# Patient Record
Sex: Female | Born: 1960 | Race: White | Hispanic: No | Marital: Single | Smoking: Former smoker
Health system: Southern US, Community
[De-identification: ages and names within clinical notes are randomized; demographics above are authoritative.]

## PROBLEM LIST (undated history)

## (undated) DIAGNOSIS — K632 Fistula of intestine: Secondary | ICD-10-CM

## (undated) DIAGNOSIS — M1990 Unspecified osteoarthritis, unspecified site: Secondary | ICD-10-CM

## (undated) DIAGNOSIS — Z933 Colostomy status: Secondary | ICD-10-CM

## (undated) DIAGNOSIS — K5720 Diverticulitis of large intestine with perforation and abscess without bleeding: Secondary | ICD-10-CM

## (undated) DIAGNOSIS — R519 Headache, unspecified: Secondary | ICD-10-CM

## (undated) DIAGNOSIS — F319 Bipolar disorder, unspecified: Secondary | ICD-10-CM

## (undated) DIAGNOSIS — J342 Deviated nasal septum: Secondary | ICD-10-CM

## (undated) HISTORY — DX: Fistula of intestine: K63.2

## (undated) HISTORY — PX: ABDOMINAL HYSTERECTOMY: SHX81

## (undated) HISTORY — DX: Diverticulitis of large intestine with perforation and abscess without bleeding: K57.20

## (undated) HISTORY — PX: KNEE ARTHROSCOPY: SUR90

## (undated) HISTORY — PX: UPPER GI ENDOSCOPY: SHX6162

---

## 1998-06-10 ENCOUNTER — Ambulatory Visit (HOSPITAL_COMMUNITY): Admission: RE | Admit: 1998-06-10 | Discharge: 1998-06-10 | Payer: Self-pay

## 2000-08-15 ENCOUNTER — Inpatient Hospital Stay (HOSPITAL_COMMUNITY): Admission: EM | Admit: 2000-08-15 | Discharge: 2000-08-21 | Payer: Self-pay | Admitting: Psychiatry

## 2002-03-14 ENCOUNTER — Encounter: Payer: Self-pay | Admitting: Emergency Medicine

## 2002-03-14 ENCOUNTER — Emergency Department (HOSPITAL_COMMUNITY): Admission: EM | Admit: 2002-03-14 | Discharge: 2002-03-14 | Payer: Self-pay | Admitting: Emergency Medicine

## 2004-03-28 ENCOUNTER — Emergency Department (HOSPITAL_COMMUNITY): Admission: EM | Admit: 2004-03-28 | Discharge: 2004-03-28 | Payer: Self-pay | Admitting: Physician Assistant

## 2005-05-18 ENCOUNTER — Emergency Department (HOSPITAL_COMMUNITY): Admission: EM | Admit: 2005-05-18 | Discharge: 2005-05-18 | Payer: Self-pay | Admitting: Emergency Medicine

## 2010-02-27 ENCOUNTER — Emergency Department (HOSPITAL_COMMUNITY): Admission: EM | Admit: 2010-02-27 | Discharge: 2010-02-28 | Payer: Self-pay | Admitting: Emergency Medicine

## 2010-02-28 ENCOUNTER — Ambulatory Visit: Payer: Self-pay | Admitting: Psychiatry

## 2010-02-28 ENCOUNTER — Inpatient Hospital Stay (HOSPITAL_COMMUNITY): Admission: AD | Admit: 2010-02-28 | Discharge: 2010-03-04 | Payer: Self-pay | Admitting: Psychiatry

## 2010-11-21 LAB — RAPID URINE DRUG SCREEN, HOSP PERFORMED
Amphetamines: NOT DETECTED
Benzodiazepines: NOT DETECTED
Cocaine: NOT DETECTED
Tetrahydrocannabinol: POSITIVE — AB

## 2010-11-21 LAB — CBC
HCT: 39.5 % (ref 36.0–46.0)
MCV: 91.3 fL (ref 78.0–100.0)
Platelets: 253 10*3/uL (ref 150–400)
RBC: 4.33 MIL/uL (ref 3.87–5.11)
RDW: 13.9 % (ref 11.5–15.5)
WBC: 9.7 10*3/uL (ref 4.0–10.5)

## 2010-11-21 LAB — URINALYSIS, ROUTINE W REFLEX MICROSCOPIC
Nitrite: NEGATIVE
Protein, ur: NEGATIVE mg/dL
Specific Gravity, Urine: 1.025 (ref 1.005–1.030)
Urobilinogen, UA: 0.2 mg/dL (ref 0.0–1.0)

## 2010-11-21 LAB — BASIC METABOLIC PANEL
BUN: 19 mg/dL (ref 6–23)
CO2: 29 mEq/L (ref 19–32)
Calcium: 10 mg/dL (ref 8.4–10.5)
Chloride: 102 mEq/L (ref 96–112)
Creatinine, Ser: 0.81 mg/dL (ref 0.4–1.2)
GFR calc Af Amer: >60 mL/min (ref >60)
GFR calc non Af Amer: >60 mL/min (ref >60)
Glucose, Bld: 90 mg/dL (ref 70–99)
Potassium: 3.2 mEq/L — ABNORMAL LOW (ref 3.5–5.1)
Sodium: 139 mEq/L (ref 135–145)

## 2010-11-21 LAB — TSH: TSH: 0.734 u[IU]/mL (ref 0.350–4.500)

## 2010-11-21 LAB — DIFFERENTIAL
Basophils Absolute: 0 10*3/uL (ref 0.0–0.1)
Lymphocytes Relative: 26 % (ref 12–46)
Lymphs Abs: 2.6 10*3/uL (ref 0.7–4.0)
Monocytes Absolute: 0.6 10*3/uL (ref 0.1–1.0)
Neutro Abs: 6.4 10*3/uL (ref 1.7–7.7)

## 2010-11-21 LAB — ETHANOL: Alcohol, Ethyl (B): <5 mg/dL (ref 0–10)

## 2010-11-21 LAB — ACETAMINOPHEN LEVEL: Acetaminophen (Tylenol), Serum: <10 ug/mL — ABNORMAL LOW (ref 10–30)

## 2010-11-21 LAB — URINE CULTURE

## 2010-11-21 LAB — SALICYLATE LEVEL: Salicylate Lvl: <4 mg/dL (ref 2.8–20.0)
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
## 2017-05-11 ENCOUNTER — Other Ambulatory Visit (HOSPITAL_COMMUNITY): Payer: Self-pay | Admitting: Family

## 2017-05-11 ENCOUNTER — Ambulatory Visit (HOSPITAL_COMMUNITY)
Admission: RE | Admit: 2017-05-11 | Discharge: 2017-05-11 | Disposition: A | Discharge status: Home / Self Care | Payer: Self-pay | Source: Ambulatory Visit | Attending: Family | Admitting: Family

## 2017-05-11 DIAGNOSIS — L03119 Cellulitis of unspecified part of limb: Secondary | ICD-10-CM | POA: Insufficient documentation

## 2017-05-11 DIAGNOSIS — L0390 Cellulitis, unspecified: Secondary | ICD-10-CM

## 2017-07-13 ENCOUNTER — Other Ambulatory Visit (HOSPITAL_COMMUNITY): Payer: Self-pay | Admitting: *Deleted

## 2017-07-13 DIAGNOSIS — L97229 Non-pressure chronic ulcer of left calf with unspecified severity: Principal | ICD-10-CM

## 2017-07-13 DIAGNOSIS — I83022 Varicose veins of left lower extremity with ulcer of calf: Secondary | ICD-10-CM

## 2017-07-13 DIAGNOSIS — I878 Other specified disorders of veins: Secondary | ICD-10-CM

## 2017-07-18 ENCOUNTER — Encounter (HOSPITAL_COMMUNITY): Payer: Self-pay

## 2017-07-18 ENCOUNTER — Ambulatory Visit (HOSPITAL_COMMUNITY)
Admission: RE | Admit: 2017-07-18 | Discharge: 2017-07-18 | Disposition: A | Discharge status: Home / Self Care | Payer: Self-pay | Source: Ambulatory Visit | Attending: *Deleted | Admitting: *Deleted

## 2017-07-20 ENCOUNTER — Ambulatory Visit (HOSPITAL_COMMUNITY)
Admission: RE | Admit: 2017-07-20 | Discharge: 2017-07-20 | Disposition: A | Discharge status: Home / Self Care | Payer: Self-pay | Source: Ambulatory Visit | Attending: *Deleted | Admitting: *Deleted

## 2017-07-20 DIAGNOSIS — L97229 Non-pressure chronic ulcer of left calf with unspecified severity: Secondary | ICD-10-CM | POA: Insufficient documentation

## 2017-07-20 DIAGNOSIS — I878 Other specified disorders of veins: Secondary | ICD-10-CM | POA: Insufficient documentation

## 2017-07-20 DIAGNOSIS — I83022 Varicose veins of left lower extremity with ulcer of calf: Secondary | ICD-10-CM | POA: Insufficient documentation

## 2017-08-22 ENCOUNTER — Ambulatory Visit (HOSPITAL_COMMUNITY): Payer: Self-pay | Attending: *Deleted | Admitting: Physical Therapy

## 2017-08-22 ENCOUNTER — Encounter (HOSPITAL_COMMUNITY): Payer: Self-pay | Admitting: Physical Therapy

## 2017-08-22 DIAGNOSIS — L97923 Non-pressure chronic ulcer of unspecified part of left lower leg with necrosis of muscle: Secondary | ICD-10-CM | POA: Insufficient documentation

## 2017-08-22 NOTE — Therapy (Signed)
[facility] [address] [place], [place], [address] Phone: [phone]   Fax:  [phone]  Wound Care Evaluation  Patient Details  Name: Judith Miller MRN: [id number] Date of Birth: 11-Nov-1960 Referring Provider: Kizzie Furnish    Encounter Date: 08/22/2017  PT End of Session - 08/22/17 1544    Visit Number  1    Number of Visits  5    Date for PT Re-Evaluation  09/21/17    Authorization Type  no insurance    Authorization - Visit Number  1    Authorization - Number of Visits  5    PT Start Time  1500    PT Stop Time  1540    PT Time Calculation (min)  40 min    Activity Tolerance  Patient limited by pain       History reviewed. No pertinent past medical history.  History reviewed. No pertinent surgical history.  There were no vitals filed for this visit.    [facility] PT Assessment - 08/22/17 0001      Assessment   Medical Diagnosis  non-healing LT posterior calf wound    Referring Provider  Rochelle Muse     Onset Date/Surgical Date  02/17/17    Next MD Visit  unknown at this time     Prior Therapy  self care       Precautions   Precautions  Other (comment)    Precaution Comments  contact       Restrictions   Weight Bearing Restrictions  No      Balance Screen   Has the patient fallen in the past 6 months  No    Has the patient had a decrease in activity level because of a fear of falling?   No    Is the patient reluctant to leave their home because of a fear of falling?   No      Prior Function   Level of Independence  Independent    Vocation  Full time employment    Vocation Requirements  standing       Cognition   Overall Cognitive Status  Within Functional Limits for tasks assessed      Wound Therapy - 08/22/17 1452    Subjective  Ms. Reckart states that she has had a wound on the back of her left leg for about five  months.  She has been washing it but it will not heal.     Patient and Family  Stated Goals  wound to heal     Date of Onset  03/19/17    Prior Treatments  self care     Pain Assessment  0-10    Pain Score  8     Pain Type  Chronic pain    Pain Location  Calf    Pain Orientation  Left;Posterior    Pain Descriptors / Indicators  Aching;Throbbing    Pain Onset  With Activity    Patients Stated Pain Goal  0    Pain Intervention(s)  Emotional support Dressing     Evaluation and Treatment Procedures Explained to Patient/Family  Yes    Evaluation and Treatment Procedures  agreed to    Wound Properties Date First Assessed: 08/22/17 Time First Assessed: 1508 Wound Type: Other (Comment) Location: Leg Location Orientation: Left;Posterior;Lower Present on Admission: Yes   Dressing Type  Gauze (Comment)    Dressing Changed  Changed    Dressing Status  Old drainage  Dressing Change Frequency  PRN    Site / Wound Assessment  Brown;Dusky;Painful    % Wound base Red or Granulating  10%    % Wound base Other/Granulation Tissue (Comment)  100%    Peri-wound Assessment  Edema;Erythema (blanchable)    Wound Length (cm)  2 cm    Wound Width (cm)  2 cm    Wound Depth (cm)  -- unknown    Wound Surface Area (cm^2)  4 cm^2    Drainage Amount  Minimal    Drainage Description  Serosanguineous    Treatment  Cleansed;Other (Comment) compression dressing     Wound Therapy - Clinical Statement  Ms. Judith Miller is a 56 yo female who has a non-healing wound on the posterior aspect of her left leg that has been non-healing for the past 4-5 months.  Her pain is so high that she is unable to tolerate any debridement at this time.  She also has a wound on the anterior aspect of her left leg that is 1 cm diameter with a scab covering the wound.  At this time we do not have orders for this wound so we will keep an eye on it and if we feel that it needs treatment we will contacrt the MD.  Ms. Judith Miller currenty is being referred to skilled physical therapy to assist in the wound healing process.   Examination demonstrates increased edema, increased slough in the wound bed and increased pain affecting the pt abillty to walk.  Ms. Judith Miller will benefit from skilled physical therapy to create a healing environment for her wound.     Wound Therapy - Functional Problem List  difficulty walking     Factors Delaying/Impairing Wound Healing  Infection - systemic/local;Tobacco use;Vascular compromise    Hydrotherapy Plan  Debridement;Dressing change;Patient/family education    Wound Therapy - Frequency  Other (comment)    Wound Therapy - Current Recommendations  -- 2 x a week for the first week followed by once a week x 5     Wound Plan  cleanse, moisturize and debride     Dressing   medihoney colloid followed by profore compression dressing              Objective measurements completed on examination: See above findings.            PT Education - 08/22/17 1542    Education provided  Yes    Education Details  That smoking will significantly delay the healing of her wound, that pt needs to wear compression stockings everyday to prevent future wounds; to keep dressing clean and dry.     Person(s) Educated  Patient    Methods  Explanation    Comprehension  Verbalized understanding       PT Short Term Goals - 08/22/17 1547      PT SHORT TERM GOAL #1   Title  wound to be 80% granulated to reduce risk of infection     Time  3    Period  Weeks    Status  New    Target Date  09/12/17      PT SHORT TERM GOAL #2   Title  Pt pain to be decreased to no more than a 3/10 to allow pt to be up for 8 hours without significant pain to be able to work.    Time  2    Period  Weeks    Status  New    Target Date  09/05/17  PT SHORT TERM GOAL #3   Title  PT to verbalize the understanding of smoking cessation and use of compression garments in order assist in wound healing and prevent future wounds.    Time  2    Period  Weeks    Status  New        PT Long Term Goals -  08/22/17 1549      PT LONG TERM GOAL #1   Title  Wound to be 100% granulated with no drainage to decrease risk of infection     Time  4    Period  Weeks    Status  New    Target Date  09/19/17      PT LONG TERM GOAL #2   Title  Pt wound to be 1x1 cm in order for pt to feel confident in taking care of her wound at home.     Time  4    Period  Weeks    Status  New           Plan - 08/22/17 1545    Clinical Impression Statement  see above     Clinical Presentation  Stable    Clinical Decision Making  Low    Rehab Potential  Good    PT Frequency  -- 2x a week for the first week then once a week x 4 weeks     PT Treatment/Interventions  Other (comment) debridement and multilayer compression dressing     PT Next Visit Plan  see above.     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 Virgina OrganCynthia Russell, PT CLT [phone] 08/22/2017, 3:58 PM  [facility] [address] , [place], [address] Phone: [phone]   Fax:  [phone]  Name: Duncan DullKaren F Mclinden MRN: [id number] Date of Birth: December 01, 1960

## 2017-08-23 ENCOUNTER — Telehealth (HOSPITAL_COMMUNITY): Payer: Self-pay

## 2017-08-24 ENCOUNTER — Ambulatory Visit (HOSPITAL_COMMUNITY): Payer: Self-pay

## 2017-08-24 ENCOUNTER — Encounter (HOSPITAL_COMMUNITY): Payer: Self-pay

## 2017-08-24 DIAGNOSIS — L97923 Non-pressure chronic ulcer of unspecified part of left lower leg with necrosis of muscle: Secondary | ICD-10-CM

## 2017-08-24 NOTE — Therapy (Signed)
[facility] [address] [place], [place], [address] Phone: [phone]   Fax:  [phone]  Wound Care Therapy  Patient Details  Name: Judith Miller MRN: [id number] Date of Birth: [birth year] Referring Provider: Kizzie Furnishochelle Muse    Encounter Date: 08/24/2017  PT End of Session - 08/24/17 1826    Visit Number  2    Number of Visits  5    Date for PT Re-Evaluation  09/21/17    Authorization Type  no insurance    Authorization - Visit Number  2    Authorization - Number of Visits  5    PT Start Time  1725    PT Stop Time  1805    PT Time Calculation (min)  40 min    Activity Tolerance  Patient tolerated treatment well       History reviewed. No pertinent past medical history.  History reviewed. No pertinent surgical history.  There were no vitals filed for this visit.   Subjective Assessment - 08/24/17 1818    Subjective  Pt stated her leg is feeling a lot better, no reports of pain at entrance.  Dressing intact    Currently in Pain?  No/denies                Wound Therapy - 08/24/17 1819    Subjective  Pt stated her leg is feeling a lot better, no reports of pain at entrance.  Dressing intact    Patient and Family Stated Goals  wound to heal     Date of Onset  03/19/17    Prior Treatments  self care     Pain Assessment  No/denies pain    Evaluation and Treatment Procedures Explained to Patient/Family  Yes    Evaluation and Treatment Procedures  agreed to    Wound Properties Date First Assessed: 08/22/17 Time First Assessed: 1508 Wound Type: Other (Comment) Location: Leg Location Orientation: Left;Posterior;Lower Present on Admission: Yes   Dressing Type  Compression wrap;Gauze (Comment) honey colloid, profore, netting    Dressing Changed  Changed    Dressing Status  Old drainage    Dressing Change Frequency  PRN    Site / Wound Assessment  Brown;Dusky;Painful;Pink    % Wound base Red or Granulating  15%    %  Wound base Yellow/Fibrinous Exudate  85%    Peri-wound Assessment  Edema;Erythema (blanchable)    Wound Length (cm)  2.2 cm    Wound Width (cm)  2.4 cm    Wound Depth (cm)  -- unknown    Wound Surface Area (cm^2)  5.28 cm^2    Drainage Amount  Minimal    Drainage Description  Serosanguineous    Treatment  Cleansed;Debridement (Selective)    Selective Debridement - Location  wound bed     Selective Debridement - Tools Used  Forceps;Scalpel    Selective Debridement - Tissue Removed  slough and dry skin perimeter    Wound Therapy - Clinical Statement  Pt arrived with dressings intact, reoprts she took a shower with trash bag on dressings but got foot a little wet.  Honey seems to have assisted with debridement this session and pt with improved tolerance with debridement.  Continued with honeycolloid and profore for edema control.  No reports of increased pain, did c/o some tingling.  Discussed smoking impaires healing, verbal understood.    Wound Therapy - Functional Problem List  difficulty walking     Factors Delaying/Impairing Wound Healing  Infection - systemic/local;Tobacco use;Vascular compromise    Hydrotherapy Plan  Debridement;Dressing change;Patient/family education    Wound Therapy - Frequency  -- 2x/week first week then reduce to 1x/week    Wound Plan  cleanse, moisturize and debride     Dressing   medihoney colloid followed by profore compression dressing                 PT Short Term Goals - 08/22/17 1547      PT SHORT TERM GOAL #1   Title  wound to be 80% granulated to reduce risk of infection     Time  3    Period  Weeks    Status  New    Target Date  09/12/17      PT SHORT TERM GOAL #2   Title  Pt pain to be decreased to no more than a 3/10 to allow pt to be up for 8 hours without significant pain to be able to work.    Time  2    Period  Weeks    Status  New    Target Date  09/05/17      PT SHORT TERM GOAL #3   Title  PT to verbalize the understanding of  smoking cessation and use of compression garments in order assist in wound healing and prevent future wounds.    Time  2    Period  Weeks    Status  New        PT Long Term Goals - 08/22/17 1549      PT LONG TERM GOAL #1   Title  Wound to be 100% granulated with no drainage to decrease risk of infection     Time  4    Period  Weeks    Status  New    Target Date  09/19/17      PT LONG TERM GOAL #2   Title  Pt wound to be 1x1 cm in order for pt to feel confident in taking care of her wound at home.     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  [address]Casey Cockerham, LPTA; CBIS [phone]  [name], [name] 08/24/2017, 6:28 PM  [facility] [address] [place], [place], [address] Phone: [phone]   Fax:  [phone]  Name: Judith Miller MRN: [id number] Date of Birth: 04/09/1961

## 2017-08-30 ENCOUNTER — Telehealth (HOSPITAL_COMMUNITY): Payer: Self-pay | Admitting: Family

## 2017-08-30 NOTE — Telephone Encounter (Signed)
08/30/17  Pt called to say that it may be around 5:00 when she gets here for her 4:45 appt on 12/27.  She gets off work at 4 and will be coming from [place].

## 2017-08-31 ENCOUNTER — Ambulatory Visit (HOSPITAL_COMMUNITY): Payer: Self-pay | Admitting: Physical Therapy

## 2017-08-31 DIAGNOSIS — L97923 Non-pressure chronic ulcer of unspecified part of left lower leg with necrosis of muscle: Secondary | ICD-10-CM

## 2017-08-31 NOTE — Therapy (Signed)
[facility] [address] [place], [place], [address] Phone: [phone]   Fax:  [phone]  Wound Care Therapy  Patient Details  Name: Judith Miller MRN: [id number] Date of Birth: February 21, 1961 Referring Provider: Kizzie Furnishochelle Muse    Encounter Date: 08/31/2017  PT End of Session - 08/31/17 1811    Visit Number  3    Number of Visits  5    Date for PT Re-Evaluation  09/21/17    Authorization Type  no insurance    Authorization - Visit Number  3    Authorization - Number of Visits  5    PT Start Time  1507    PT Stop Time  1535    PT Time Calculation (min)  28 min    Activity Tolerance  Patient tolerated treatment well       No past medical history on file.  No past surgical history on file.  There were no vitals filed for this visit.              Wound Therapy - 08/31/17 1809    Subjective  Pt reports she is doing well.  States the dressing remained in place    Patient and Family Stated Goals  wound to heal     Date of Onset  03/19/17    Prior Treatments  self care     Pain Assessment  No/denies pain    Evaluation and Treatment Procedures Explained to Patient/Family  Yes    Evaluation and Treatment Procedures  agreed to    Wound Properties Date First Assessed: 08/22/17 Time First Assessed: 1508 Wound Type: Other (Comment) Location: Leg Location Orientation: Left;Posterior;Lower Present on Admission: Yes   Dressing Type  Compression wrap;Gauze (Comment) honey colloid, profore, netting    Dressing Changed  Changed    Dressing Status  Old drainage    Dressing Change Frequency  PRN    Site / Wound Assessment  Brown;Dusky;Painful;Pink    % Wound base Red or Granulating  40%    % Wound base Yellow/Fibrinous Exudate  60%    Peri-wound Assessment  Edema;Erythema (blanchable)    Drainage Amount  Minimal    Drainage Description  Serosanguineous    Treatment  Cleansed;Debridement (Selective)    Selective Debridement -  Location  wound bed     Selective Debridement - Tools Used  Forceps;Scalpel    Selective Debridement - Tissue Removed  slough and dry skin perimeter    Wound Therapy - Clinical Statement  dressing intact.  Much improved granualtion following debridment.  Pt tolerated well.  Continued with medihoney colloid. Cleased and moisturized LE well priro to redressing with profore.      Wound Therapy - Functional Problem List  difficulty walking     Factors Delaying/Impairing Wound Healing  Infection - systemic/local;Tobacco use;Vascular compromise    Hydrotherapy Plan  Debridement;Dressing change;Patient/family education    Wound Therapy - Frequency  -- 2x/week first week then reduce to 1x/week    Wound Plan  cleanse, moisturize and debride     Dressing   medihoney colloid followed by profore compression dressing                 PT Short Term Goals - 08/22/17 1547      PT SHORT TERM GOAL #1   Title  wound to be 80% granulated to reduce risk of infection     Time  3    Period  Weeks  Status  New    Target Date  09/12/17      PT SHORT TERM GOAL #2   Title  Pt pain to be decreased to no more than a 3/10 to allow pt to be up for 8 hours without significant pain to be able to work.    Time  2    Period  Weeks    Status  New    Target Date  09/05/17      PT SHORT TERM GOAL #3   Title  PT to verbalize the understanding of smoking cessation and use of compression garments in order assist in wound healing and prevent future wounds.    Time  2    Period  Weeks    Status  New        PT Long Term Goals - 08/22/17 1549      PT LONG TERM GOAL #1   Title  Wound to be 100% granulated with no drainage to decrease risk of infection     Time  4    Period  Weeks    Status  New    Target Date  09/19/17      PT LONG TERM GOAL #2   Title  Pt wound to be 1x1 cm in order for pt to feel confident in taking care of her wound at home.     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  Lurena NidaAmy B Welby Montminy, PTA/CLT [phone]  Lurena NidaFrazier, Crecencio Kwiatek B 08/31/2017, 6:12 PM  [facility] [address] [place], [place], [address] Phone: [phone]   Fax:  [phone]  Name: Judith Miller MRN: [id number] Date of Birth: May 14, 1961

## 2017-09-07 ENCOUNTER — Encounter (HOSPITAL_COMMUNITY): Payer: Self-pay

## 2017-09-07 ENCOUNTER — Ambulatory Visit (HOSPITAL_COMMUNITY): Payer: Self-pay | Attending: *Deleted

## 2017-09-07 DIAGNOSIS — L97923 Non-pressure chronic ulcer of unspecified part of left lower leg with necrosis of muscle: Secondary | ICD-10-CM | POA: Insufficient documentation

## 2017-09-07 NOTE — Therapy (Signed)
[facility] [address] [place], [place], [address] Phone: [phone]   Fax:  [phone]  Wound Care Therapy  Patient Details  Name: Judith Miller MRN: [id number] Date of Birth: [birth year] Referring Provider: Kizzie Furnishochelle Muse    Encounter Date: 09/07/2017  PT End of Session - 09/07/17 1749    Visit Number  4    Number of Visits  5    Date for PT Re-Evaluation  09/21/17    Authorization Type  no insurance    Authorization - Visit Number  4    Authorization - Number of Visits  5    PT Start Time  1615    PT Stop Time  1645    PT Time Calculation (min)  30 min    Activity Tolerance  Patient tolerated treatment well    Behavior During Therapy  [facility] for tasks assessed/performed       History reviewed. No pertinent past medical history.  History reviewed. No pertinent surgical history.  There were no vitals filed for this visit.   Subjective Assessment - 09/07/17 1743    Subjective  Pt arrived with dressings intact, reoprts she had to remove an outer layer for comfort as well as having to replace due to water on dressings.    Currently in Pain?  No/denies                Wound Therapy - 09/07/17 1744    Subjective  Pt arrived with dressings intact, reoprts she had to remove an outer layer for comfort as well as having to replace due to water on dressings.    Patient and Family Stated Goals  wound to heal     Date of Onset  03/19/17    Prior Treatments  self care     Pain Assessment  No/denies pain    Evaluation and Treatment Procedures Explained to Patient/Family  Yes    Evaluation and Treatment Procedures  agreed to    Wound Properties Date First Assessed: 08/22/17 Time First Assessed: 1508 Wound Type: Other (Comment) Location: Leg Location Orientation: Left;Posterior;Lower Present on Admission: Yes   Dressing Type  -- medihoneycolloid, alginate, gauze and profore     Dressing Changed  Changed    Dressing Status  Old  drainage    Dressing Change Frequency  PRN    Site / Wound Assessment  Brown;Dusky;Painful;Pink    % Wound base Red or Granulating  75%    % Wound base Yellow/Fibrinous Exudate  25%    Wound Length (cm)  2.2 cm was 2 on 12/18    Wound Width (cm)  2.4 cm was 2cm on 12/18    Wound Depth (cm)  -- unknown    Wound Surface Area (cm^2)  5.28 cm^2    Drainage Amount  Minimal    Drainage Description  Serosanguineous    Treatment  Cleansed;Debridement (Selective)    Selective Debridement - Location  wound bed and perimeter    Selective Debridement - Tools Used  Forceps;Scalpel    Selective Debridement - Tissue Removed  slough and dry skin perimeter    Wound Therapy - Clinical Statement  Noted maceration perimeter of wound upon removal, selective debridement for removal of slough on wound bed and dead tissue to promote healing.  Continued with medihoney colloid, with additional alginate to address drainage and profore wrap for edema control.      Wound Therapy - Functional Problem List  difficulty walking  Factors Delaying/Impairing Wound Healing  Infection - systemic/local;Tobacco use;Vascular compromise    Hydrotherapy Plan  Debridement;Dressing change;Patient/family education    Wound Therapy - Frequency  -- 1x/ week    Wound Plan  cleanse, moisturize and debride     Dressing   medihoney colloid, alginate followed by profore compression dressing                 PT Short Term Goals - 08/22/17 1547      PT SHORT TERM GOAL #1   Title  wound to be 80% granulated to reduce risk of infection     Time  3    Period  Weeks    Status  New    Target Date  09/12/17      PT SHORT TERM GOAL #2   Title  Pt pain to be decreased to no more than a 3/10 to allow pt to be up for 8 hours without significant pain to be able to work.    Time  2    Period  Weeks    Status  New    Target Date  09/05/17      PT SHORT TERM GOAL #3   Title  PT to verbalize the understanding of smoking cessation  and use of compression garments in order assist in wound healing and prevent future wounds.    Time  2    Period  Weeks    Status  New        PT Long Term Goals - 08/22/17 1549      PT LONG TERM GOAL #1   Title  Wound to be 100% granulated with no drainage to decrease risk of infection     Time  4    Period  Weeks    Status  New    Target Date  09/19/17      PT LONG TERM GOAL #2   Title  Pt wound to be 1x1 cm in order for pt to feel confident in taking care of her wound at home.     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  [address] LPTA; CBIS [phone]  Juel Burrow 09/07/2017, 5:49 PM  [facility] [address] [place], [place], [address] Phone: [phone]   Fax:  [phone]  Name: Judith Miller MRN: [id number] Date of Birth: 10/30/1960

## 2017-09-14 ENCOUNTER — Ambulatory Visit (HOSPITAL_COMMUNITY): Payer: Self-pay | Admitting: Physical Therapy

## 2017-09-15 ENCOUNTER — Telehealth (HOSPITAL_COMMUNITY): Payer: Self-pay | Admitting: Physical Therapy

## 2017-09-15 ENCOUNTER — Ambulatory Visit (HOSPITAL_COMMUNITY): Payer: Self-pay | Admitting: Physical Therapy

## 2017-09-15 ENCOUNTER — Telehealth (HOSPITAL_COMMUNITY): Payer: Self-pay | Admitting: Family

## 2017-09-15 DIAGNOSIS — L97923 Non-pressure chronic ulcer of unspecified part of left lower leg with necrosis of muscle: Secondary | ICD-10-CM

## 2017-09-15 NOTE — Therapy (Signed)
[facility] [address] [place], [place], [address] Phone: [phone]   Fax:  [phone]  Physical Therapy Treatment  Patient Details  Name: Judith Miller MRN: [id number] Date of Birth: 1960-11-11 Referring Provider: Kizzie Furnishochelle Muse    Encounter Date: 09/15/2017  PT End of Session - 09/15/17 1734    Visit Number  5    Number of Visits  6    Date for PT Re-Evaluation  09/21/17    Authorization Type  no insurance    Authorization - Visit Number  5    Authorization - Number of Visits  6    Activity Tolerance  Patient tolerated treatment well    Behavior During Therapy  [facility] for tasks assessed/performed       No past medical history on file.  No past surgical history on file.  There were no vitals filed for this visit.                 Wound Therapy - 09/15/17 1723    Subjective  Pt reports her leg is feeling better overall    Patient and Family Stated Goals  wound to heal     Date of Onset  03/19/17    Prior Treatments  self care     Pain Assessment  No/denies pain    Evaluation and Treatment Procedures Explained to Patient/Family  Yes    Evaluation and Treatment Procedures  agreed to    Wound Properties Date First Assessed: 08/22/17 Time First Assessed: 1508 Wound Type: Other (Comment) Location: Leg Location Orientation: Left;Posterior;Lower Present on Admission: Yes   Dressing Type  Compression wrap;Gauze (Comment)    Dressing Changed  Changed    Dressing Status  Old drainage    Dressing Change Frequency  PRN    Site / Wound Assessment  Red;Pink;Dry    % Wound base Red or Granulating  75%    % Wound base Yellow/Fibrinous Exudate  25%    Wound Length (cm)  2 cm was 2.2    Wound Width (cm)  2.2 cm was 2.1    Wound Surface Area (cm^2)  4.4 cm^2    Drainage Amount  Scant    Drainage Description  Serosanguineous    Treatment  Cleansed;Debridement (Selective)    Selective Debridement - Location  wound bed and  perimeter    Selective Debridement - Tools Used  Forceps;Scalpel    Selective Debridement - Tissue Removed  slough and dry skin perimeter    Wound Therapy - Clinical Statement  Wound remeasured with overall reduction in size compared to last session.  Hypergranlualtion noted and was able to blade most of his as well as hard edges preventing approximation.  Pt voices she is unable to take care of wound on her own at this point. Changed dressing to xeroform and applied medipore at a stretch to add additional compression.  Moisturized LE well priro to bandaging with profore.    Wound Therapy - Functional Problem List  difficulty walking     Factors Delaying/Impairing Wound Healing  Infection - systemic/local;Tobacco use;Vascular compromise    Hydrotherapy Plan  Debridement;Dressing change;Patient/family education    Wound Plan  continue 1X week until patient can manage independently.  Give patient information on elastic therapy outlet in [place].    Dressing   xeroform, profore               PT Education - 09/15/17 1733    Education provided  Yes  Education Details  importance of smoking cessation and need to continue wearing compression stockings.    Person(s) Educated  Patient    Methods  Explanation    Comprehension  Verbalized understanding;Need further instruction       PT Short Term Goals - 08/22/17 1547      PT SHORT TERM GOAL #1   Title  wound to be 80% granulated to reduce risk of infection     Time  3    Period  Weeks    Status  New    Target Date  09/12/17      PT SHORT TERM GOAL #2   Title  Pt pain to be decreased to no more than a 3/10 to allow pt to be up for 8 hours without significant pain to be able to work.    Time  2    Period  Weeks    Status  New    Target Date  09/05/17      PT SHORT TERM GOAL #3   Title  PT to verbalize the understanding of smoking cessation and use of compression garments in order assist in wound healing and prevent future wounds.     Time  2    Period  Weeks    Status  New        PT Long Term Goals - 08/22/17 1549      PT LONG TERM GOAL #1   Title  Wound to be 100% granulated with no drainage to decrease risk of infection     Time  4    Period  Weeks    Status  New    Target Date  09/19/17      PT LONG TERM GOAL #2   Title  Pt wound to be 1x1 cm in order for pt to feel confident in taking care of her wound at home.     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  Lurena Nida, PTA/CLT [phone]  Lurena Nida 09/15/2017, 5:35 PM  [facility] [address] [place], [place], [address] Phone: [phone]   Fax:  [phone]  Name: Judith Miller MRN: [id number] Date of Birth: 1960/11/19

## 2017-09-15 NOTE — Telephone Encounter (Signed)
09/15/17  I tried calling both numbers listed to see if patient could come in earlier but neither are working

## 2017-09-15 NOTE — Telephone Encounter (Signed)
Updated phone numbers for pt. NF 09/15/17

## 2017-09-21 ENCOUNTER — Ambulatory Visit (HOSPITAL_COMMUNITY): Payer: Self-pay | Admitting: Physical Therapy

## 2017-09-21 DIAGNOSIS — L97923 Non-pressure chronic ulcer of unspecified part of left lower leg with necrosis of muscle: Secondary | ICD-10-CM

## 2017-09-21 NOTE — Therapy (Signed)
[facility] [address] [place], [place], [address] Phone: [phone]   Fax:  [phone]  Wound Care Therapy  Patient Details  Name: Judith Miller MRN: [id number] Date of Birth: [birth year] Referring Provider: Kizzie Furnishochelle Muse    Encounter Date: 09/21/2017    No past medical history on file.  No past surgical history on file.  There were no vitals filed for this visit.              Wound Therapy - 09/21/17 1739    Subjective  no pain or issues to report    Patient and Family Stated Goals  wound to heal     Date of Onset  03/19/17    Prior Treatments  self care     Pain Assessment  No/denies pain    Evaluation and Treatment Procedures Explained to Patient/Family  Yes    Evaluation and Treatment Procedures  agreed to    Wound Properties Date First Assessed: 08/22/17 Time First Assessed: 1508 Wound Type: Other (Comment) Location: Leg Location Orientation: Left;Posterior;Lower Present on Admission: Yes   Dressing Type  Compression wrap;Gauze (Comment)    Dressing Changed  Changed    Dressing Status  Old drainage    Dressing Change Frequency  PRN    Site / Wound Assessment  Red;Pink;Dry    % Wound base Red or Granulating  85%    % Wound base Yellow/Fibrinous Exudate  15%    Wound Length (cm)  1.8 cm    Wound Width (cm)  1.8 cm    Wound Depth (cm)  0 cm    Wound Volume (cm^3)  0 cm^3    Wound Surface Area (cm^2)  3.24 cm^2    Drainage Amount  Scant    Drainage Description  Serosanguineous    Treatment  Cleansed;Debridement (Selective)    Selective Debridement - Location  wound bed and perimeter    Selective Debridement - Tools Used  Forceps;Scalpel    Selective Debridement - Tissue Removed  slough and dry skin perimeter    Wound Therapy - Clinical Statement  Vast improvment noted this session since changing dressing to xeroform and applying compression with medipore tape. Cleansed area well and debrided edges using  scapel and forceps.  Edges are now approximating well.  continued use of xeroform and profore.  Weekly visits remain appropriate.      Wound Therapy - Functional Problem List  difficulty walking     Factors Delaying/Impairing Wound Healing  Infection - systemic/local;Tobacco use;Vascular compromise    Hydrotherapy Plan  Debridement;Dressing change;Patient/family education    Wound Plan  continue 1X week until patient can manage independently.  Give patient information on elastic therapy outlet in [place].    Dressing   xeroform, profore                PT Short Term Goals - 08/22/17 1547      PT SHORT TERM GOAL #1   Title  wound to be 80% granulated to reduce risk of infection     Time  3    Period  Weeks    Status  New    Target Date  09/12/17      PT SHORT TERM GOAL #2   Title  Pt pain to be decreased to no more than a 3/10 to allow pt to be up for 8 hours without significant pain to be able to work.    Time  2    Period  Weeks    Status  New    Target Date  09/05/17      PT SHORT TERM GOAL #3   Title  PT to verbalize the understanding of smoking cessation and use of compression garments in order assist in wound healing and prevent future wounds.    Time  2    Period  Weeks    Status  New        PT Long Term Goals - 08/22/17 1549      PT LONG TERM GOAL #1   Title  Wound to be 100% granulated with no drainage to decrease risk of infection     Time  4    Period  Weeks    Status  New    Target Date  09/19/17      PT LONG TERM GOAL #2   Title  Pt wound to be 1x1 cm in order for pt to feel confident in taking care of her wound at home.     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  Lurena Nida, PTA/CLT [phone]  Lurena Nida 09/21/2017, 5:44 PM  [facility] [address] [place], [place], [address] Phone: [phone]   Fax:  [phone]  Name: Judith Miller MRN: [id number] Date of Birth: May 01, 1961

## 2017-09-28 ENCOUNTER — Other Ambulatory Visit: Payer: Self-pay

## 2017-09-28 ENCOUNTER — Encounter (HOSPITAL_COMMUNITY): Payer: Self-pay | Admitting: Physical Therapy

## 2017-09-28 ENCOUNTER — Ambulatory Visit (HOSPITAL_COMMUNITY): Payer: Self-pay | Admitting: Physical Therapy

## 2017-09-28 DIAGNOSIS — L97923 Non-pressure chronic ulcer of unspecified part of left lower leg with necrosis of muscle: Secondary | ICD-10-CM

## 2017-09-28 NOTE — Therapy (Signed)
Judith Miller, [place], [address] Phone: [phone]   Fax:  [phone]  Wound Care Therapy  Patient Details  Name: Judith Miller MRN: [id number] Date of Birth: February 09, 1961 Referring Provider: Royce Macadamia    Encounter Date: 09/28/2017  PT End of Session - 09/28/17 1600    Visit Number  6    Number of Visits  6    Date for PT Re-Evaluation  09/21/17    Authorization Type  no insurance    Authorization - Visit Number  6    Authorization - Number of Visits  6    PT Start Time  6811    PT Stop Time  1550    PT Time Calculation (min)  35 min    Activity Tolerance  Patient tolerated treatment well    Behavior During Therapy  [facility] for tasks assessed/performed       History reviewed. No pertinent past medical history.  History reviewed. No pertinent surgical history.  There were no vitals filed for this visit.              Wound Therapy - 09/28/17 1552    Subjective  no pain or issues to report    Patient and Family Stated Goals  wound to heal     Date of Onset  03/19/17    Prior Treatments  self care     Pain Assessment  No/denies pain    Evaluation and Treatment Procedures Explained to Patient/Family  Yes    Evaluation and Treatment Procedures  agreed to    Wound Properties Date First Assessed: 08/22/17 Time First Assessed: 1508 Wound Type: Other (Comment) Location: Leg Location Orientation: Left;Posterior;Lower Present on Admission: Yes   Dressing Type  Compression wrap;Gauze (Comment)    Dressing Changed  Changed    Dressing Status  Old drainage    Dressing Change Frequency  PRN    Site / Wound Assessment  Red;Pink;Dry    % Wound base Red or Granulating  90% was 60%    % Wound base Yellow/Fibrinous Exudate  10% was 40%    Wound Length (cm)  1.2 cm was 2.2    Wound Width (cm)  1.3 cm was 2.4     Wound Surface Area (cm^2)  1.56 cm^2    Drainage Amount  Scant    Drainage Description   Serosanguineous    Treatment  Cleansed;Debridement (Selective)    Selective Debridement - Location  wound bed and perimeter    Selective Debridement - Tools Used  Forceps    Selective Debridement - Tissue Removed  slough and dry skin perimeter    Wound Therapy - Clinical Statement  Pt wound continues to decrease in size and increase in granulation.  Pt wound size no longer warrents skilled care.  Pt was given information on where to obtain a compression stocking and measured for correct fit.  Therapist explained to call down to the outlet prior to going down to ensure that they had her size available.  Pt states that she will need to get her son to drive her to [facility] as her car is acting up; therefore therapist applied profore to LE and instructed pt that when her son can take her take the profore off prior to leaving  and that she must get her compression stockings within the week.  Pt voiced understanding.     Wound Therapy - Functional Problem List  difficulty walking  Factors Delaying/Impairing Wound Healing  Infection - systemic/local;Tobacco use;Vascular compromise    Hydrotherapy Plan  Debridement;Dressing change;Patient/family education    Wound Plan  continue 1X week until patient can manage independently.  Give patient information on elastic therapy outlet in [place].    Dressing   xeroform, profore              PT Education - 09/28/17 1600    Education provided  Yes    Education Details  see above    Person(s) Educated  Patient    Methods  Explanation    Comprehension  Verbalized understanding       PT Short Term Goals - 09/28/17 1601      PT SHORT TERM GOAL #1   Title  wound to be 80% granulated to reduce risk of infection     Time  3    Period  Weeks    Status  Achieved      PT SHORT TERM GOAL #2   Title  Pt pain to be decreased to no more than a 3/10 to allow pt to be up for 8 hours without significant pain to be able to work.    Time  2    Period  Weeks     Status  Achieved      PT SHORT TERM GOAL #3   Title  PT to verbalize the understanding of smoking cessation and use of compression garments in order assist in wound healing and prevent future wounds.    Time  2    Period  Weeks    Status  Achieved        PT Long Term Goals - 09/28/17 1601      PT LONG TERM GOAL #1   Title  Wound to be 100% granulated with no drainage to decrease risk of infection     Baseline  was 60% granulated now 40%     Time  4    Period  Weeks    Status  Partially Met      PT LONG TERM GOAL #2   Title  Pt wound to be 1x1 cm in order for pt to feel confident in taking care of her wound at home.     Baseline  -- currently 1.2 x 1.3     Time  4    Period  Weeks    Status  Partially Met            Plan - 09/28/17 1601    Rehab Potential  Good    PT Frequency  -- 2x a week for the first week then once a week x 4 weeks     PT Treatment/Interventions  Other (comment) debridement and multilayer compression dressing     PT Next Visit Plan  Discharge     Consulted and Agree with Plan of Care  Patient       Patient will benefit from skilled therapeutic intervention in order to improve the following deficits and impairments:  Pain, Difficulty walking, Other (comment)(debridement and compression dressing change)  Visit Diagnosis: 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   Rayetta Humphrey, PT CLT [phone] 09/28/2017, 4:03 PM  [facility] [address] [place], [place], [address] Phone: [phone]   Fax:  [phone]  Name: Judith Miller MRN: [id number] Date of Birth: [birth year]

## 2017-10-02 ENCOUNTER — Ambulatory Visit (HOSPITAL_COMMUNITY): Payer: Self-pay | Admitting: Physical Therapy

## 2017-10-05 ENCOUNTER — Ambulatory Visit (HOSPITAL_COMMUNITY): Payer: Self-pay

## 2017-10-09 ENCOUNTER — Ambulatory Visit (HOSPITAL_COMMUNITY): Payer: Self-pay | Admitting: Physical Therapy

## 2017-10-16 ENCOUNTER — Ambulatory Visit (HOSPITAL_COMMUNITY): Payer: Self-pay | Admitting: Physical Therapy

## 2017-10-23 ENCOUNTER — Ambulatory Visit (HOSPITAL_COMMUNITY): Payer: Self-pay | Admitting: Physical Therapy

## 2017-10-30 ENCOUNTER — Ambulatory Visit (HOSPITAL_COMMUNITY): Payer: Self-pay | Admitting: Physical Therapy

## 2019-10-02 ENCOUNTER — Emergency Department (HOSPITAL_COMMUNITY)
Admission: EM | Admit: 2019-10-02 | Discharge: 2019-10-02 | Disposition: A | Discharge status: Home / Self Care | Payer: Self-pay | Attending: Emergency Medicine | Admitting: Emergency Medicine

## 2019-10-02 ENCOUNTER — Other Ambulatory Visit: Payer: Self-pay

## 2019-10-02 ENCOUNTER — Encounter (HOSPITAL_COMMUNITY): Payer: Self-pay | Admitting: Emergency Medicine

## 2019-10-02 ENCOUNTER — Emergency Department (HOSPITAL_COMMUNITY): Payer: Self-pay

## 2019-10-02 DIAGNOSIS — M25561 Pain in right knee: Secondary | ICD-10-CM

## 2019-10-02 DIAGNOSIS — F17210 Nicotine dependence, cigarettes, uncomplicated: Secondary | ICD-10-CM | POA: Insufficient documentation

## 2019-10-02 DIAGNOSIS — M1711 Unilateral primary osteoarthritis, right knee: Secondary | ICD-10-CM | POA: Insufficient documentation

## 2019-10-02 HISTORY — DX: Deviated nasal septum: J34.2

## 2019-10-02 NOTE — ED Notes (Signed)
Pt sent home with an ace wrap. Do not have knee sleeves currently.

## 2019-10-02 NOTE — ED Provider Notes (Signed)
[facility] Provider Note   CSN: [id number] Arrival date & time: 10/02/19  7353     History Chief Complaint  Patient presents with  . Knee Pain    Judith Miller is a 59 y.o. female.  HPI She complains of atraumatic pain in right knee, for 2 weeks.  Pain is primarily located in the popliteal space.  She does not feel she has increased swelling of the right lower leg.  She has previously had surgery on her left knee for "a torn cartilage," 15 years ago.  She also has chronic pain from an injury of her left lower calf.  She wears compression stockings bilaterally.  No hip or back pain.  There are no other known modifying factors.    Past Medical History:  Diagnosis Date  . Deviated septum     There are no problems to display for this patient.   Past Surgical History:  Procedure Laterality Date  . ABDOMINAL HYSTERECTOMY    . KNEE ARTHROSCOPY Left   . UPPER GI ENDOSCOPY       OB History    Gravida      Para      Term      Preterm      AB      Living  3     SAB      TAB      Ectopic      Multiple      Live Births              History reviewed. No pertinent family history.  Social History   Tobacco Use  . Smoking status: Current Every Day Smoker    Packs/day: 0.50    Years: 40.00    Pack years: 20.00    Types: Cigarettes  . Smokeless tobacco: Never Used  Substance Use Topics  . Alcohol use: Never  . Drug use: Never    Home Medications Prior to Admission medications   Not on File    Allergies    Sulfa antibiotics  Review of Systems   Review of Systems  All other systems reviewed and are negative.   Physical Exam Updated Vital Signs BP (!) 174/92 (BP Location: Left Arm)   Pulse 92   Temp 98.6 F (37 C) (Oral)   Ht 5\' 7"  (1.702 m)   Wt 95.3 kg   SpO2 96%   BMI 32.89 kg/m   Physical Exam Vitals and nursing note reviewed.  Constitutional:      Appearance: She is well-developed.  HENT:     Head:  Normocephalic and atraumatic.     Right Ear: External ear normal.     Left Ear: External ear normal.  Eyes:     Conjunctiva/sclera: Conjunctivae normal.     Pupils: Pupils are equal, round, and reactive to light.  Neck:     Trachea: Phonation normal.  Cardiovascular:     Rate and Rhythm: Normal rate.  Pulmonary:     Effort: Pulmonary effort is normal.  Musculoskeletal:     Cervical back: Normal range of motion and neck supple.     Comments: Right knee with small effusion.  Right knee is mobile with normal range of motion both actively and passively.  There is no instability of the right knee with stress and no pain with compression on the medial or lateral compartments using passive movements of the lower leg.  No right knee popliteal mass or significant tenderness.  Trace edema  both lower legs, which is symmetric.  Right calf is nontender to palpation.  Skin:    General: Skin is warm and dry.  Neurological:     Mental Status: She is alert and oriented to person, place, and time.     Cranial Nerves: No cranial nerve deficit.     Sensory: No sensory deficit.     Motor: No abnormal muscle tone.     Coordination: Coordination normal.  Psychiatric:        Mood and Affect: Mood normal.        Behavior: Behavior normal.        Thought Content: Thought content normal.        Judgment: Judgment normal.     ED Results / Procedures / Treatments   Labs (all labs ordered are listed, but only abnormal results are displayed) Labs Reviewed - No data to display  EKG None  Radiology No results found.  Procedures Procedures (including critical care time)  Medications Ordered in ED Medications - No data to display  ED Course  I have reviewed the triage vital signs and the nursing notes.  Pertinent labs & imaging results that were available during my care of the patient were reviewed by me and considered in my medical decision making (see chart for details).    MDM  Rules/Calculators/A&P                       Patient Vitals for the past 24 hrs:  BP Temp Temp src Pulse SpO2 Height Weight  10/02/19 0948 -- -- -- -- -- 5\' 7"  (1.702 m) 95.3 kg  10/02/19 0947 (!) 174/92 98.6 F (37 C) Oral 92 96 % -- --    10:01 AM Reevaluation with update and discussion. After initial assessment and treatment, an updated evaluation reveals no additional complaints, findings discussed with the patient and all questions were answered. 10/04/19   Medical Decision Making: Right knee pain likely secondary to arthritis.  Doubt fracture.  Doubt septic arthritis.  Doubt DVT.  Stable for discharge.  CRITICAL CARE-no Performed by: Mancel Bale  Nursing Notes Reviewed/ Care Coordinated Applicable Imaging Reviewed Interpretation of Laboratory Data incorporated into ED treatment  The patient appears reasonably screened and/or stabilized for discharge and I doubt any other medical condition or other [facility] requiring further screening, evaluation, or treatment in the ED at this time prior to discharge.  Plan: Home Medications-continue routine medicine and use ibuprofen for pain; Home Treatments-knee sleeve or Ace wrap for comfort; return here if the recommended treatment, does not improve the symptoms; Recommended follow up-orthopedic follow-up as soon as possible.  Final Clinical Impression(s) / ED Diagnoses Final diagnoses:  None    Rx / DC Orders ED Discharge Orders    None       [facility], MD 10/02/19 1100

## 2019-10-02 NOTE — Discharge Instructions (Addendum)
The pain in your right knee, appears to be from arthritis.  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  Treatments for this include a compression wrap, and ibuprofen 400 mg 3 times a day.  Avoid putting stress on your right knee.  Call the orthopedist for a follow-up appointment as soon as possible.

## 2019-10-02 NOTE — ED Triage Notes (Signed)
PT c/o swelling and pain to right knee x2 weeks with no injury.

## 2021-04-01 ENCOUNTER — Emergency Department (HOSPITAL_COMMUNITY)
Admission: EM | Admit: 2021-04-01 | Discharge: 2021-04-01 | Disposition: A | Discharge status: Home / Self Care | Payer: Self-pay | Attending: Emergency Medicine | Admitting: Emergency Medicine

## 2021-04-01 ENCOUNTER — Emergency Department (HOSPITAL_COMMUNITY): Payer: Self-pay

## 2021-04-01 ENCOUNTER — Encounter (HOSPITAL_COMMUNITY): Payer: Self-pay | Admitting: *Deleted

## 2021-04-01 ENCOUNTER — Other Ambulatory Visit: Payer: Self-pay

## 2021-04-01 DIAGNOSIS — F17210 Nicotine dependence, cigarettes, uncomplicated: Secondary | ICD-10-CM | POA: Insufficient documentation

## 2021-04-01 DIAGNOSIS — R350 Frequency of micturition: Secondary | ICD-10-CM | POA: Insufficient documentation

## 2021-04-01 DIAGNOSIS — R109 Unspecified abdominal pain: Secondary | ICD-10-CM | POA: Insufficient documentation

## 2021-04-01 DIAGNOSIS — M545 Low back pain, unspecified: Secondary | ICD-10-CM | POA: Insufficient documentation

## 2021-04-01 LAB — URINALYSIS, ROUTINE W REFLEX MICROSCOPIC
Bilirubin Urine: NEGATIVE
Glucose, UA: NEGATIVE mg/dL
Hgb urine dipstick: NEGATIVE
Ketones, ur: NEGATIVE mg/dL
Leukocytes,Ua: NEGATIVE
Nitrite: POSITIVE — AB
Protein, ur: NEGATIVE mg/dL
Specific Gravity, Urine: 1.008 (ref 1.005–1.030)
pH: 7 (ref 5.0–8.0)

## 2021-04-01 LAB — CBC
HCT: 49.1 % — ABNORMAL HIGH (ref 36.0–46.0)
Hemoglobin: 16 g/dL — ABNORMAL HIGH (ref 12.0–15.0)
MCH: 30.9 pg (ref 26.0–34.0)
MCHC: 32.6 g/dL (ref 30.0–36.0)
MCV: 94.8 fL (ref 80.0–100.0)
Platelets: 278 10*3/uL (ref 150–400)
RBC: 5.18 MIL/uL — ABNORMAL HIGH (ref 3.87–5.11)
RDW: 13.1 % (ref 11.5–15.5)
WBC: 11.1 10*3/uL — ABNORMAL HIGH (ref 4.0–10.5)
nRBC: 0 % (ref 0.0–0.2)

## 2021-04-01 LAB — BASIC METABOLIC PANEL
Anion gap: 10 (ref 5–15)
BUN: 16 mg/dL (ref 6–20)
CO2: 29 mmol/L (ref 22–32)
Calcium: 10.2 mg/dL (ref 8.9–10.3)
Chloride: 102 mmol/L (ref 98–111)
Creatinine, Ser: 0.66 mg/dL (ref 0.44–1.00)
GFR, Estimated: >60 mL/min (ref >60)
Glucose, Bld: 86 mg/dL (ref 70–99)
Potassium: 4 mmol/L (ref 3.5–5.1)
Sodium: 141 mmol/L (ref 135–145)

## 2021-04-01 MED ORDER — CEPHALEXIN 500 MG PO CAPS
500.0000 mg | ORAL_CAPSULE | Freq: Once | ORAL | Status: AC
  Administered 2021-04-01: 500 mg via ORAL
  Filled 2021-04-01: qty 1

## 2021-04-01 MED ORDER — CEPHALEXIN 500 MG PO CAPS: 500.0000 mg | ORAL_CAPSULE | Freq: Two times a day (BID) | ORAL | 0 refills | Status: DC

## 2021-04-01 NOTE — Discharge Instructions (Addendum)
You have been prescribed Keflex for a suspected urinary tract infection.  Your CT scan is negative for kidney stone or kidney infection, your exam does suggest possible muscle and rib cage soreness as your pain is reproducible with palpation over the site.  I using ibuprofen, heating pad may also be helpful 20 minutes several times daily.  Take the full course of the antibiotic prescribed, you may continue using your Azo as well.  Plan to follow-up with your primary doctor if your symptoms persist, returning here for any worsening symptoms fevers, abdominal pain or any other new symptoms.

## 2021-04-01 NOTE — ED Triage Notes (Signed)
Pain in right flank area. Possible UTI

## 2021-04-01 NOTE — ED Notes (Signed)
Patient transported to CT 

## 2021-04-03 NOTE — ED Provider Notes (Signed)
[facility] EMERGENCY DEPARTMENT Provider Note   CSN: [id number] Arrival date & time: 04/01/21  1401     History Chief Complaint  Patient presents with   Flank Pain    Judith Miller is a 60 y.o. female with no significant past medical history presenting with right flank pain and urinary urgency and frequency for the past several days.  She reports aching pain at her right flank which worsens with movement and palpation, gradual onset.  She denies fevers, chills, n/v, gross hematuria but her urine has been darker than normal.  She denies history of kidney stones.  She works in Set [employer], lifts heavy objects, denies history of mechanical back pain.  No radiation of pain in her legs, no weakness, no incontinence or urinary retention.  The history is provided by the patient.      Past Medical History:  Diagnosis Date   Deviated septum     There are no problems to display for this patient.   Past Surgical History:  Procedure Laterality Date   ABDOMINAL HYSTERECTOMY     KNEE ARTHROSCOPY Left    UPPER GI ENDOSCOPY       OB History     Gravida      Para      Term      Preterm      AB      Living  3      SAB      IAB      Ectopic      Multiple      Live Births              No family history on file.  Social History   Tobacco Use   Smoking status: Every Day    Packs/day: 0.50    Years: 40.00    Pack years: 20.00    Types: Cigarettes   Smokeless tobacco: Never  Vaping Use   Vaping Use: Never used  Substance Use Topics   Alcohol use: Never   Drug use: Never    Home Medications Prior to Admission medications   Medication Sig Start Date End Date Taking? Authorizing Provider  cephALEXin (KEFLEX) 500 MG capsule Take 1 capsule (500 mg total) by mouth 2 (two) times daily. 04/01/21  Yes Sugey Trevathan, Raynelle Fanning, PA-C    Allergies    Sulfa antibiotics  Review of Systems   Review of Systems  Constitutional:  Negative for chills and fever.  HENT:   Negative for congestion and sore throat.   Eyes: Negative.   Respiratory:  Negative for chest tightness and shortness of breath.   Cardiovascular:  Negative for chest pain.  Gastrointestinal:  Negative for abdominal pain and nausea.  Genitourinary:  Positive for flank pain, frequency and urgency.  Musculoskeletal:  Negative for arthralgias, joint swelling and neck pain.  Skin: Negative.  Negative for rash and wound.  Neurological:  Negative for dizziness, weakness, light-headedness, numbness and headaches.  Psychiatric/Behavioral: Negative.     Physical Exam Updated Vital Signs BP 139/88   Pulse 75   Temp 98.7 F (37.1 C)   Resp 18   Ht 5' 7.25" (1.708 m)   Wt 83 kg   SpO2 96%   BMI 28.45 kg/m   Physical Exam Vitals and nursing note reviewed.  Constitutional:      Appearance: She is well-developed.  HENT:     Head: Normocephalic and atraumatic.  Eyes:     Conjunctiva/sclera: Conjunctivae normal.  Cardiovascular:  Rate and Rhythm: Normal rate and regular rhythm.     Heart sounds: Normal heart sounds.  Pulmonary:     Effort: Pulmonary effort is normal.     Breath sounds: Normal breath sounds. No wheezing.  Abdominal:     General: Bowel sounds are normal.     Palpations: Abdomen is soft.     Tenderness: There is no abdominal tenderness.  Musculoskeletal:        General: Normal range of motion.     Cervical back: Normal range of motion.     Lumbar back: Tenderness present.     Comments: Ttp right paralumbar region, no midline ttp, no specific cva tenderness.   Skin:    General: Skin is warm and dry.  Neurological:     Mental Status: She is alert.    ED Results / Procedures / Treatments   Labs (all labs ordered are listed, but only abnormal results are displayed) Labs Reviewed  URINALYSIS, ROUTINE W REFLEX MICROSCOPIC - Abnormal; Notable for the following components:      Result Value   Color, Urine AMBER (*)    Nitrite POSITIVE (*)    Bacteria, UA FEW (*)     All other components within normal limits  CBC - Abnormal; Notable for the following components:   WBC 11.1 (*)    RBC 5.18 (*)    Hemoglobin 16.0 (*)    HCT 49.1 (*)    All other components within normal limits  BASIC METABOLIC PANEL    EKG None  Radiology CT Renal Stone Study  Result Date: 04/01/2021 CLINICAL DATA:  60 year old female with right flank pain. Concern for kidney stone. EXAM: CT ABDOMEN AND PELVIS WITHOUT CONTRAST TECHNIQUE: Multidetector CT imaging of the abdomen and pelvis was performed following the standard protocol without IV contrast. COMPARISON:  None. FINDINGS: Evaluation of this exam is limited in the absence of intravenous contrast. Lower chest: Minimal bibasilar linear atelectasis/scarring. The visualized lung bases are otherwise clear. No intra-abdominal free air or free fluid. Hepatobiliary: No focal liver abnormality is seen. No gallstones, gallbladder wall thickening, or biliary dilatation. Pancreas: Unremarkable. No pancreatic ductal dilatation or surrounding inflammatory changes. Spleen: Normal in size without focal abnormality. Adrenals/Urinary Tract: Minimal adrenal thickening. There is no hydronephrosis or nephrolithiasis on either side. The visualized ureters and urinary bladder appear unremarkable. Stomach/Bowel: There is sigmoid diverticulosis. Minimal perisigmoid stranding, likely chronic and related to prior inflammation/scarring. Mild or early acute diverticulitis is less likely but not excluded clinical correlation is recommended. No diverticular abscess or perforation.Evaluation of the sigmoid is limited in the absence of oral contrast and due to presence of stool. Correlation with colonoscopy after resolution acute symptoms recommended. There is moderate stool throughout the colon. There is no bowel obstruction. The appendix is normal. Vascular/Lymphatic: Advanced aortoiliac atherosclerotic disease. The IVC is unremarkable. No portal venous gas. There is  no adenopathy. Several small mildly rounded perisigmoid lymph nodes, likely reactive. Reproductive: The uterus is retroflexed. No adnexal masses. Other: None Musculoskeletal: Degenerative changes of the spine. No acute osseous pathology. IMPRESSION: 1. No hydronephrosis or nephrolithiasis. 2. Sigmoid diverticulosis. Minimal perisigmoid stranding, likely chronic and related to prior inflammation/scarring. Mild or early acute diverticulitis is less likely but not excluded clinical correlation is recommended. No diverticular abscess or perforation. 3. Aortic Atherosclerosis (ICD10-I70.0). Electronically Signed   By: Elgie Collard M.D.   On: 04/01/2021 20:13    Procedures Procedures   Medications Ordered in ED Medications  cephALEXin (KEFLEX) capsule 500 mg (500  mg Oral Given 04/01/21 2032)    ED Course  I have reviewed the triage vital signs and the nursing notes.  Pertinent labs & imaging results that were available during my care of the patient were reviewed by me and considered in my medical decision making (see chart for details).    MDM Rules/Calculators/A&P                           Imaging and labs reviewed and discussed with pt.  No pyelonephritis and no renal or ureteral stones identified. She has no LLQ discomfort, doubt early diverticulitis, favor chronic finding.  Nitrite positive, symptomatic so tx with keflex.  Given reproducible right lumbar pain, could also be MSK component.  Advised ibuprofen, heat tx. F/u pcp for any persistent or worsened sx.    The patient appears reasonably screened and/or stabilized for discharge and I doubt any other medical condition or other [facility] requiring further screening, evaluation, or treatment in the ED at this time prior to discharge.  Final Clinical Impression(s) / ED Diagnoses Final diagnoses:  Right flank pain  Urinary frequency    Rx / DC Orders ED Discharge Orders          Ordered    cephALEXin (KEFLEX) 500 MG capsule  2 times  daily        04/01/21 2026             Burgess Amor, PA-C 04/03/21 1327    Long, Arlyss Repress, MD 04/05/21 1252

## 2021-11-27 ENCOUNTER — Encounter (HOSPITAL_COMMUNITY): Payer: Self-pay

## 2021-11-27 ENCOUNTER — Inpatient Hospital Stay (HOSPITAL_COMMUNITY)
Admission: AD | Admit: 2021-11-27 | Discharge: 2021-12-28 | DRG: 329 | Disposition: A | Discharge status: Home Health | Payer: Self-pay | Source: Other Acute Inpatient Hospital | Attending: Surgery | Admitting: Surgery

## 2021-11-27 DIAGNOSIS — N1330 Unspecified hydronephrosis: Secondary | ICD-10-CM | POA: Diagnosis present

## 2021-11-27 DIAGNOSIS — K651 Peritoneal abscess: Secondary | ICD-10-CM | POA: Diagnosis present

## 2021-11-27 DIAGNOSIS — J90 Pleural effusion, not elsewhere classified: Secondary | ICD-10-CM | POA: Diagnosis not present

## 2021-11-27 DIAGNOSIS — K9171 Accidental puncture and laceration of a digestive system organ or structure during a digestive system procedure: Secondary | ICD-10-CM | POA: Diagnosis not present

## 2021-11-27 DIAGNOSIS — K631 Perforation of intestine (nontraumatic): Secondary | ICD-10-CM

## 2021-11-27 DIAGNOSIS — J9811 Atelectasis: Secondary | ICD-10-CM | POA: Diagnosis not present

## 2021-11-27 DIAGNOSIS — Y658 Other specified misadventures during surgical and medical care: Secondary | ICD-10-CM | POA: Diagnosis not present

## 2021-11-27 DIAGNOSIS — B966 Bacteroides fragilis [B. fragilis] as the cause of diseases classified elsewhere: Secondary | ICD-10-CM | POA: Diagnosis present

## 2021-11-27 DIAGNOSIS — K660 Peritoneal adhesions (postprocedural) (postinfection): Secondary | ICD-10-CM | POA: Diagnosis present

## 2021-11-27 DIAGNOSIS — F319 Bipolar disorder, unspecified: Secondary | ICD-10-CM | POA: Diagnosis present

## 2021-11-27 DIAGNOSIS — M25552 Pain in left hip: Secondary | ICD-10-CM | POA: Diagnosis not present

## 2021-11-27 DIAGNOSIS — K567 Ileus, unspecified: Secondary | ICD-10-CM | POA: Diagnosis present

## 2021-11-27 DIAGNOSIS — Z882 Allergy status to sulfonamides status: Secondary | ICD-10-CM

## 2021-11-27 DIAGNOSIS — Z1611 Resistance to penicillins: Secondary | ICD-10-CM | POA: Diagnosis present

## 2021-11-27 DIAGNOSIS — D62 Acute posthemorrhagic anemia: Secondary | ICD-10-CM | POA: Diagnosis not present

## 2021-11-27 DIAGNOSIS — Y733 Surgical instruments, materials and gastroenterology and urology devices (including sutures) associated with adverse incidents: Secondary | ICD-10-CM | POA: Diagnosis not present

## 2021-11-27 DIAGNOSIS — K5720 Diverticulitis of large intestine with perforation and abscess without bleeding: Principal | ICD-10-CM | POA: Diagnosis present

## 2021-11-27 DIAGNOSIS — B954 Other streptococcus as the cause of diseases classified elsewhere: Secondary | ICD-10-CM | POA: Diagnosis present

## 2021-11-27 DIAGNOSIS — B961 Klebsiella pneumoniae [K. pneumoniae] as the cause of diseases classified elsewhere: Secondary | ICD-10-CM | POA: Diagnosis present

## 2021-11-27 DIAGNOSIS — Z79899 Other long term (current) drug therapy: Secondary | ICD-10-CM

## 2021-11-27 DIAGNOSIS — B952 Enterococcus as the cause of diseases classified elsewhere: Secondary | ICD-10-CM | POA: Diagnosis present

## 2021-11-27 DIAGNOSIS — F17210 Nicotine dependence, cigarettes, uncomplicated: Secondary | ICD-10-CM | POA: Diagnosis present

## 2021-11-27 HISTORY — DX: Perforation of intestine (nontraumatic): K63.1

## 2021-11-27 HISTORY — DX: Bipolar disorder, unspecified: F31.9

## 2021-11-27 MED ORDER — HYDROMORPHONE HCL 1 MG/ML IJ SOLN
0.5000 mg | INTRAMUSCULAR | Status: DC | PRN
  Administered 2021-11-27 – 2021-11-28 (×4): 1 mg via INTRAVENOUS
  Administered 2021-11-28: 0.5 mg via INTRAVENOUS
  Administered 2021-11-29 – 2021-12-03 (×24): 1 mg via INTRAVENOUS
  Filled 2021-11-27 (×30): qty 1

## 2021-11-27 MED ORDER — RISPERIDONE 0.5 MG PO TBDP
0.5000 mg | ORAL_TABLET | Freq: Every morning | ORAL | Status: DC
Start: 2021-11-28 — End: 2021-12-28
  Administered 2021-11-28 – 2021-12-28 (×30): 0.5 mg via ORAL
  Filled 2021-11-27 (×34): qty 1

## 2021-11-27 MED ORDER — ONDANSETRON 4 MG PO TBDP: 4.0000 mg | ORAL_TABLET | Freq: Four times a day (QID) | ORAL | Status: DC | PRN

## 2021-11-27 MED ORDER — LACTATED RINGERS IV SOLN: INTRAVENOUS | Status: DC

## 2021-11-27 MED ORDER — RISPERIDONE 1 MG PO TBDP
1.5000 mg | ORAL_TABLET | Freq: Every day | ORAL | Status: DC
  Administered 2021-11-27 – 2021-12-27 (×30): 1.5 mg via ORAL
  Filled 2021-11-27 (×32): qty 1

## 2021-11-27 MED ORDER — ENOXAPARIN SODIUM 40 MG/0.4ML IJ SOSY
40.0000 mg | PREFILLED_SYRINGE | INTRAMUSCULAR | Status: DC
  Administered 2021-11-27 – 2021-12-21 (×25): 40 mg via SUBCUTANEOUS
  Filled 2021-11-27 (×26): qty 0.4

## 2021-11-27 MED ORDER — PIPERACILLIN-TAZOBACTAM 3.375 G IVPB
3.3750 g | Freq: Three times a day (TID) | INTRAVENOUS | Status: AC
  Administered 2021-11-27 – 2021-12-04 (×21): 3.375 g via INTRAVENOUS
  Filled 2021-11-27 (×20): qty 50

## 2021-11-27 MED ORDER — LIP MEDEX EX OINT
TOPICAL_OINTMENT | CUTANEOUS | Status: DC | PRN
  Administered 2021-11-28: 75 via TOPICAL
  Filled 2021-11-27: qty 7

## 2021-11-27 MED ORDER — ONDANSETRON HCL 4 MG/2ML IJ SOLN
4.0000 mg | Freq: Four times a day (QID) | INTRAMUSCULAR | Status: DC | PRN
Start: 2021-11-27 — End: 2021-12-28
  Filled 2021-11-27: qty 2

## 2021-11-27 NOTE — H&P (Signed)
? ?MARQUETTA WEISKOPF ?[birth year]  ?[id number].   ? ?Chief Complaint/Reason for Consult: sigmoid colon perforation ? ?HPI:  ?Judith Miller is a 61 yo female who presents as a transfer from [facility] with a perforated sigmoid colon. She began having constipation and abdominal pain about 2 weeks ago. She started taking Miralax but the symptoms persisted. She denies any blood in her stool, and her last bowel movement was 3 days ago. She was first seen in the ED on 3/13 and diagnosed with pancreatitis because her lipase was mildly elevated at 120. She returned on 3/17 with ongoing abdominal pain and constipation and had an elevated WBC at that time. A CT scan was recommended but she declined to get a scan and was discharged home with a bowel regimen. She then returned to the ED yesterday with persistent pain. WBC was 41. A CT scan was done and showed multiple intraabdominal fluid collections, the largest of which was in pelvis adjacent to the sigmoid colon, with associated sigmoid thickening.  She also had dilation of the small bowel consistent with ileus.  The patient was evaluated by a surgeon at [facility] and percutaneous drainage of the abscess was recommended. Transfer to [facility] was requested for IR availability. ? ?The patient has been waiting on a bed until this evening.  She was started on antibiotics at the referring facility and an NG tube was placed.  She is afebrile and hemodynamically stable on arrival.  She reports ongoing lower abdominal pain, but it is somewhat better with pain medications.  She denies any fevers, chills, or vomiting at home.  She has never had a colonoscopy and has no known family history of colon cancer. ? ?COVID test was negative in the ED (reviewed in Care Everywhere). ? ?ROS: ?Review of Systems  ?Constitutional:  Negative for chills and fever.  ?HENT:  Negative for sore throat.   ?Eyes:  Negative for pain and redness.  ?Respiratory:  Negative for shortness of breath,  wheezing and stridor.   ?Cardiovascular:  Negative for chest pain.  ?Gastrointestinal:  Positive for abdominal pain and constipation. Negative for blood in stool, nausea and vomiting.  ?Neurological:  Negative for loss of consciousness and weakness.  ?Psychiatric/Behavioral:    ?     History of bipolar disorder  ? ?Family History  ?Problem Relation Age of Onset  ? Colon cancer Neg Hx   ? ? ?Past Medical History:  ?Diagnosis Date  ? Bipolar disorder (HCC)   ? Deviated septum   ? ? ?Past Surgical History:  ?Procedure Laterality Date  ? ABDOMINAL HYSTERECTOMY    ? KNEE ARTHROSCOPY Left   ? UPPER GI ENDOSCOPY    ? ? ?Social History:  reports that she has been smoking cigarettes. She has a 20.00 pack-year smoking history. She has never used smokeless tobacco. She reports that she does not drink alcohol and does not use drugs. ? ?Allergies:  ?Allergies  ?Allergen Reactions  ? Sulfa Antibiotics   ?  headache  ? ? ?Medications Prior to Admission  ?Medication Sig Dispense Refill  ? risperiDONE (RISPERDAL) 0.5 MG tablet Take 0.5 mg by mouth in the morning.    ? risperiDONE (RISPERDAL) 1 MG tablet Take 1.5 mg by mouth at bedtime.    ? ? ? ?Physical Exam: ?Blood pressure (!) 124/58, pulse 82, temperature 97.7 ?F (36.5 ?C), temperature source Oral, resp. rate 20, SpO2 93 %. ?General: resting comfortably, appears stated age, no apparent distress ?Neurological: alert and oriented, no  focal deficits ?HEENT: normocephalic, atraumatic. NG in place to suction draining bilious fluid. ?CV: extremities warm and well-perfused ?Respiratory: normal work of breathing on room air, symmetric chest wall expansion ?Abdomen: soft, mildly distended, tender to palpation especially across the lower abdomen. No masses or organomegaly. ?Extremities: warm and well-perfused, no deformities, moving all extremities spontaneously ?Psychiatric: normal mood and affect ?Skin: warm and dry, no jaundice, no rashes or lesions ? ? ?No results found for this or  any previous visit (from the past 48 hour(s)). ?No results found. ? ? ? ?Assessment/Plan ?This is a 61 yo female presenting with a pelvic abscess secondary to a sigmoid colon perforation, presumably secondary to diverticulitis, however an underlying malignancy is also a possibility.  I personally reviewed the patient's CT scan, which shows a large gas containing fluid collection in the pelvis, as well as some free fluid within the mesentery of the small bowel and associated small bowel dilation.  There is thickening of the sigmoid colon as well as multiple diverticuli.  The patient has a significant leukocytosis but is currently hemodynamically stable without fevers or tachycardia, and there is no free air on her scan.  Thus we will plan for percutaneous drainage of the abscess..  If she improves with antibiotics and percutaneous drainage, we will plan for outpatient follow-up to discuss elective sigmoid colectomy.  If she worsens or does not improve with percutaneous drainage, she will need a sigmoid colectomy and end colostomy, which I reviewed with her. ?- NPO, NG tube to LIS.  Appropriate position of NG tube within the stomach confirmed on outside imaging. ?- IV antibiotics: Zosyn ?- Labs pending ?- IR consult in am for percutaneous drain ?- Resume home risperidone for history of bipolar disorder; dosage confirmed with patient ?- VTE: lovenox, SCDs ?- Admit to inpatient, med-surg floor. Patient will need a colonoscopy in 6-8 weeks. ? ? ?Sophronia Simas, MD ?[facility] ?General, Hepatobiliary and Pancreatic Surgery ?11/27/21 10:44 PM ? ? ?

## 2021-11-28 ENCOUNTER — Inpatient Hospital Stay (HOSPITAL_COMMUNITY): Payer: Self-pay

## 2021-11-28 ENCOUNTER — Other Ambulatory Visit: Payer: Self-pay

## 2021-11-28 LAB — BASIC METABOLIC PANEL
Anion gap: 9 (ref 5–15)
BUN: 22 mg/dL — ABNORMAL HIGH (ref 6–20)
CO2: 31 mmol/L (ref 22–32)
Calcium: 8.7 mg/dL — ABNORMAL LOW (ref 8.9–10.3)
Chloride: 101 mmol/L (ref 98–111)
Creatinine, Ser: 0.53 mg/dL (ref 0.44–1.00)
GFR, Estimated: >60 mL/min (ref >60)
Glucose, Bld: 93 mg/dL (ref 70–99)
Potassium: 3.8 mmol/L (ref 3.5–5.1)
Sodium: 141 mmol/L (ref 135–145)

## 2021-11-28 LAB — CBC
HCT: 36.1 % (ref 36.0–46.0)
Hemoglobin: 11.9 g/dL — ABNORMAL LOW (ref 12.0–15.0)
MCH: 28.1 pg (ref 26.0–34.0)
MCHC: 33 g/dL (ref 30.0–36.0)
MCV: 85.1 fL (ref 80.0–100.0)
Platelets: 429 10*3/uL — ABNORMAL HIGH (ref 150–400)
RBC: 4.24 MIL/uL (ref 3.87–5.11)
RDW: 14 % (ref 11.5–15.5)
WBC: 30.5 10*3/uL — ABNORMAL HIGH (ref 4.0–10.5)
nRBC: 0 % (ref 0.0–0.2)

## 2021-11-28 LAB — MAGNESIUM: Magnesium: 2.1 mg/dL (ref 1.7–2.4)

## 2021-11-28 LAB — PROTIME-INR
INR: 1.2 (ref 0.8–1.2)
Prothrombin Time: 14.8 seconds (ref 11.4–15.2)

## 2021-11-28 LAB — HIV ANTIBODY (ROUTINE TESTING W REFLEX): HIV Screen 4th Generation wRfx: NONREACTIVE

## 2021-11-28 MED ORDER — LIDOCAINE HCL 1 % IJ SOLN
INTRAMUSCULAR | Status: AC
  Filled 2021-11-28: qty 10

## 2021-11-28 MED ORDER — MIDAZOLAM HCL 2 MG/2ML IJ SOLN
INTRAMUSCULAR | Status: AC
  Filled 2021-11-28: qty 2

## 2021-11-28 MED ORDER — FENTANYL CITRATE (PF) 100 MCG/2ML IJ SOLN
INTRAMUSCULAR | Status: AC
  Filled 2021-11-28: qty 2

## 2021-11-28 MED ORDER — FENTANYL CITRATE (PF) 100 MCG/2ML IJ SOLN
INTRAMUSCULAR | Status: AC | PRN
Start: 2021-11-28 — End: 2021-11-28
  Administered 2021-11-28 (×6): 25 ug via INTRAVENOUS

## 2021-11-28 MED ORDER — MIDAZOLAM HCL 2 MG/2ML IJ SOLN
INTRAMUSCULAR | Status: AC | PRN
  Administered 2021-11-28 (×6): .5 mg via INTRAVENOUS

## 2021-11-28 NOTE — Procedures (Signed)
Interventional Radiology Procedure Note ? ?Procedure:  ?CT guided midline pelvic drain (12 fr) ?CT guided left posterior pelvic drain (10 fr)  ? ?Indication: Multiple abscesses ? ?Findings: Please refer to procedural dictation for full description. ? ?Complications: None ? ?EBL: < 10 mL ? ?Acquanetta Belling, MD ?[phone] ? ? ?

## 2021-11-28 NOTE — Progress Notes (Signed)
Foul smelling milky output recorded from JP drains. Right side more productive than the left ?

## 2021-11-28 NOTE — Progress Notes (Signed)
? ?  Subjective/Chief Complaint: ?Still quite tender across lower abdomen  ?IR has evaluated for two separate drains to be placed today - appreciate their prompt evaluation ? ? ?Objective: ?Vital signs in last 24 hours: ?Temp:  [97.7 ?F (36.5 ?C)-98.1 ?F (36.7 ?C)] 98.1 ?F (36.7 ?C) (03/26 0915) ?Pulse Rate:  [82-102] 102 (03/26 0915) ?Resp:  [18-20] 18 (03/26 0915) ?BP: (124-132)/(58-62) 132/62 (03/26 0915) ?SpO2:  [90 %-93 %] 90 % (03/26 0915) ?Last BM Date : 11/25/21 ? ?Intake/Output from previous day: ?No intake/output data recorded. ?Intake/Output this shift: ?No intake/output data recorded. ? ?Uncomfortable ?Abd - soft, mildly distended; tender to palpation across lower abdomen ? ?Lab Results:  ?Recent Labs  ?  11/28/21 ?7353  ?WBC 30.5*  ?HGB 11.9*  ?HCT 36.1  ?PLT 429*  ? ?BMET ?Recent Labs  ?  11/28/21 ?2992  ?NA 141  ?K 3.8  ?CL 101  ?CO2 31  ?GLUCOSE 93  ?BUN 22*  ?CREATININE 0.53  ?CALCIUM 8.7*  ? ?PT/INR ?No results for input(s): LABPROT, INR in the last 72 hours. ?ABG ?No results for input(s): PHART, HCO3 in the last 72 hours. ? ?Invalid input(s): PCO2, PO2 ? ?Studies/Results: ?No results found. ? ?Anti-infectives: ?Anti-infectives (From admission, onward)  ? ? Start     Dose/Rate Route Frequency Ordered Stop  ? 11/27/21 2300  piperacillin-tazobactam (ZOSYN) IVPB 3.375 g       ? 3.375 g ?12.5 mL/hr over 240 Minutes Intravenous Every 8 hours 11/27/21 2202 12/04/21 2159  ? ?  ? ? ?Assessment/Plan: ?Sigmoid diverticulitis with multiple pelvic abscesses ?Percutaneous drainage by IR today ?Antibiotic treatment with Zosyn ?NPO x ice chips/ NG tube for ileus ? ?If patient does not improve with percutaneous drains, then will likely need urgent sigmoid colectomy with temporary colostomy. ?VTE - Lovenox, SCD's ? LOS: 1 day  ? ? ?Wilmon Arms Amoria Mclees ?11/28/2021 ? ?

## 2021-11-28 NOTE — Sedation Documentation (Signed)
First drain placed. Patient to be flipped prone for second drain placement ?

## 2021-11-28 NOTE — Consult Note (Signed)
? ?Chief Complaint: Patient was seen in consultation today for intra-abdominal fluid collections ? ?Referring Physician(s): Sophronia Simas, MD ? ?Supervising Physician: Mir, Mauri Reading ? ?Patient Status: [facility] - In-pt  ? ?History of Present Illness: ?Judith Miller is a 62 y.o. female with a past medical history significant for bipolar disorder who presented to [facility] 11/26/21 with complaints of abdominal pain and constipation x 2 weeks. She was found to have significant leukocytosis (41) and CT abd/pelvis showed multiple intra-abdominal fluid collections, largest of which in the pelvis adjacent to the sigmoid colon with associated sigmoid thickening as well as dilation of the small bowel consistent with ileus. General surgery at [facility] was consulted who recommended percutaneous drainage and the patient was transferred to [facility] on the general surgery service. IR has been consulted for possible percutaneous aspiration/drain placement. ? ?Patient seen in her room today, NG to suction with dark bilious output in canister. She reports abdominal pain has improved because she just received pain medication although she continues to have lower abdominal spasms that are painful. She is concerned that her animals will jump on her at home and dislodge her drains, however she would like to avoid surgery and is willing to proceed. ? ?Past Medical History:  ?Diagnosis Date  ? Bipolar disorder (HCC)   ? Deviated septum   ? ? ?Past Surgical History:  ?Procedure Laterality Date  ? ABDOMINAL HYSTERECTOMY    ? KNEE ARTHROSCOPY Left   ? UPPER GI ENDOSCOPY    ? ? ?Allergies: ?Sulfa antibiotics ? ?Medications: ?Prior to Admission medications   ?Medication Sig Start Date End Date Taking? Authorizing Provider  ?risperiDONE (RISPERDAL) 0.5 MG tablet Take 0.5 mg by mouth in the morning.   Yes [provider]  ?risperiDONE (RISPERDAL) 1 MG tablet Take 1.5 mg by mouth at bedtime.   Yes [provider]  ?  ? ?Family  History  ?Problem Relation Age of Onset  ? Colon cancer Neg Hx   ? ? ?Social History  ? ?Socioeconomic History  ? Marital status: Married  ?  Spouse name: Not on file  ? Number of children: Not on file  ? Years of education: Not on file  ? Highest education level: Not on file  ?Occupational History  ? Not on file  ?Tobacco Use  ? Smoking status: Every Day  ?  Packs/day: 0.50  ?  Years: 40.00  ?  Pack years: 20.00  ?  Types: Cigarettes  ? Smokeless tobacco: Never  ?Vaping Use  ? Vaping Use: Never used  ?Substance and Sexual Activity  ? Alcohol use: Never  ? Drug use: Never  ? Sexual activity: Not on file  ?Other Topics Concern  ? Not on file  ?Social History Narrative  ? Not on file  ? ?Social Determinants of Health  ? ?Financial Resource Strain: Not on file  ?Food Insecurity: Not on file  ?Transportation Needs: Not on file  ?Physical Activity: Not on file  ?Stress: Not on file  ?Social Connections: Not on file  ? ? ? ?Review of Systems: A 12 point ROS discussed and pertinent positives are indicated in the HPI above.  All other systems are negative. ? ?Review of Systems  ?Constitutional:  Positive for fatigue ("just had pain medicine that makes me tired"). Negative for chills and fever.  ?Respiratory:  Negative for cough and shortness of breath.   ?Cardiovascular:  Negative for chest pain.  ?Gastrointestinal:  Positive for constipation. Negative for abdominal pain, diarrhea, nausea and vomiting.  ?  Musculoskeletal:  Negative for back pain.  ?Neurological:  Negative for dizziness and headaches.  ? ?Vital Signs: ?BP 132/62 (BP Location: Left Arm)   Pulse 100   Temp 98 ?F (36.7 ?C) (Oral)   Resp 20   SpO2 92%  ? ?Physical Exam ?Vitals and nursing note reviewed.  ?Constitutional:   ?   General: She is not in acute distress. ?HENT:  ?   Head: Normocephalic.  ?   Mouth/Throat:  ?   Mouth: Mucous membranes are dry.  ?   Pharynx: Oropharynx is clear. No oropharyngeal exudate or posterior oropharyngeal erythema.   ?Cardiovascular:  ?   Rate and Rhythm: Regular rhythm. Tachycardia present.  ?Pulmonary:  ?   Effort: Pulmonary effort is normal.  ?   Breath sounds: Normal breath sounds.  ?Abdominal:  ?   General: There is no distension.  ?   Palpations: Abdomen is soft.  ?   Tenderness: There is abdominal tenderness (TTP lower abdomen mostly).  ?   Comments: (+) NG in place draining dark brown bilious appearing output  ?Skin: ?   General: Skin is warm and dry.  ?Neurological:  ?   Mental Status: She is alert and oriented to person, place, and time.  ?Psychiatric:     ?   Mood and Affect: Mood normal.     ?   Behavior: Behavior normal.     ?   Thought Content: Thought content normal.     ?   Judgment: Judgment normal.  ? ? ? ?MD Evaluation ?Airway: WNL ?Heart: WNL ?Abdomen: WNL ((+) NG) ?Chest/ Lungs: WNL ((+) supplemental O2 via [place]) ?ASA  Classification: 3 ?Mallampati/Airway Score: Two ? ? ?Imaging: ?No results found. ? ?Labs: ? ?CBC: ?Recent Labs  ?  04/01/21 ?1505 11/28/21 ?6578  ?WBC 11.1* 30.5*  ?HGB 16.0* 11.9*  ?HCT 49.1* 36.1  ?PLT 278 429*  ? ? ?COAGS: ?No results for input(s): INR, APTT in the last 8760 hours. ? ?BMP: ?Recent Labs  ?  04/01/21 ?1505 11/28/21 ?4696  ?NA 141 141  ?K 4.0 3.8  ?CL 102 101  ?CO2 29 31  ?GLUCOSE 86 93  ?BUN 16 22*  ?CALCIUM 10.2 8.7*  ?CREATININE 0.66 0.53  ?GFRNONAA >60 >60  ? ? ?LIVER FUNCTION TESTS: ?No results for input(s): BILITOT, AST, ALT, ALKPHOS, PROT, ALBUMIN in the last 8760 hours. ? ?TUMOR MARKERS: ?No results for input(s): AFPTM, CEA, CA199, CHROMGRNA in the last 8760 hours. ? ?Assessment and Plan: ? ?61 y/o F with history of abdominal pain and constipation x 2 weeks who presented to [facility] on 3/24 and was found to have multiple intra-abdominal fluid collections likely 2/2 perforated diverticulitis as well as ileus. She has been transferred to [facility] under general surgery service and IR has been asked to evaluate for possible aspiration/drain placement. ? ?Patient history and imaging  reviewed by Dr. Bryn Gulling who approves patient for likely 2 drains today.  ? ?Patient has been NPO since midnight, Lovenox 40 mg last night at 2320 (ok per Dr. Bryn Gulling). WBC 30.5, hgb 11.9, plt 420, INR pending. ? ?Risks and benefits discussed with the patient including bleeding, infection, damage to adjacent structures, bowel perforation/fistula connection, and sepsis. ? ?All of the patient's questions were answered, patient is agreeable to proceed. ? ?Consent signed and in chart. ? ?Thank you for this interesting consult.  I greatly enjoyed meeting Judith Miller and look forward to participating in their care.  A copy of this report was sent to  the requesting provider on this date. ? ?Electronically Signed: ?Villa HerbShannon A Lenwood Balsam, PA-C ?11/28/2021, 9:06 AM ? ? ?I spent a total of 40 Minutes 3in face to face in clinical consultation, greater than 50% of which was counseling/coordinating care for intra-abdominal abscesses. ? ?

## 2021-11-28 NOTE — Progress Notes (Signed)
Per daughter, pt will need home health needs but does not have insurance so care management requested. Daughter also mentioned that pt had mental health issues and has not been compliant with medication. She also added that her mom has some hoarding and personal hygiene issues ?

## 2021-11-29 ENCOUNTER — Other Ambulatory Visit: Payer: Self-pay | Admitting: Radiology

## 2021-11-29 LAB — BASIC METABOLIC PANEL
Anion gap: 5 (ref 5–15)
BUN: 22 mg/dL — ABNORMAL HIGH (ref 6–20)
CO2: 35 mmol/L — ABNORMAL HIGH (ref 22–32)
Calcium: 8.5 mg/dL — ABNORMAL LOW (ref 8.9–10.3)
Chloride: 103 mmol/L (ref 98–111)
Creatinine, Ser: 0.46 mg/dL (ref 0.44–1.00)
GFR, Estimated: >60 mL/min (ref >60)
Glucose, Bld: 88 mg/dL (ref 70–99)
Potassium: 3.7 mmol/L (ref 3.5–5.1)
Sodium: 143 mmol/L (ref 135–145)

## 2021-11-29 LAB — CBC
HCT: 32.1 % — ABNORMAL LOW (ref 36.0–46.0)
Hemoglobin: 10.6 g/dL — ABNORMAL LOW (ref 12.0–15.0)
MCH: 28.6 pg (ref 26.0–34.0)
MCHC: 33 g/dL (ref 30.0–36.0)
MCV: 86.5 fL (ref 80.0–100.0)
Platelets: 385 10*3/uL (ref 150–400)
RBC: 3.71 MIL/uL — ABNORMAL LOW (ref 3.87–5.11)
RDW: 14.5 % (ref 11.5–15.5)
WBC: 17.6 10*3/uL — ABNORMAL HIGH (ref 4.0–10.5)
nRBC: 0 % (ref 0.0–0.2)

## 2021-11-29 LAB — MAGNESIUM: Magnesium: 2 mg/dL (ref 1.7–2.4)

## 2021-11-29 MED ORDER — POTASSIUM CHLORIDE 10 MEQ/100ML IV SOLN
10.0000 meq | INTRAVENOUS | Status: AC
  Administered 2021-11-29 (×3): 10 meq via INTRAVENOUS
  Filled 2021-11-29 (×2): qty 100

## 2021-11-29 NOTE — Progress Notes (Signed)
[facility] ?Progress Note ? ?   ?Subjective: ?CC:  ?NAEO. Feels her pain is slightly improved, controlled with meds. Denies flatus or BM. Has not been OOB. ? ?Objective: ?Vital signs in last 24 hours: ?Temp:  [98 ?F (36.7 ?C)-98.6 ?F (37 ?C)] 98.2 ?F (36.8 ?C) (03/27 0730) ?Pulse Rate:  [77-85] 77 (03/27 0730) ?Resp:  [12-21] 16 (03/27 0730) ?BP: (109-131)/(51-67) 127/57 (03/27 0730) ?SpO2:  [92 %-99 %] 93 % (03/27 0730) ?Last BM Date : 11/25/21 ? ?Intake/Output from previous day: ?03/26 0701 - 03/27 0700 ?In: 843.9 [I.V.:736.9; IV Piggyback:107] ?Out: 5038 [Urine:1000; Emesis/NG output:250; Drains:1285] ?Intake/Output this shift: ?No intake/output data recorded. ? ?PE: ?Gen:  Alert, NAD, pleasant ?Card:  Regular rate and rhythm, pedal pulses 2+ BL ?Pulm:  Normal effort, clear to auscultation bilaterally ?Abd: Soft, appropriately tender, NG in place draining green bilious fluid, drains with purulent material in bulbs. ?Skin: warm and dry, no rashes  ?Psych: A&Ox3  ? ?Lab Results:  ?Recent Labs  ?  11/28/21 ?8828 11/29/21 ?0034  ?WBC 30.5* 17.6*  ?HGB 11.9* 10.6*  ?HCT 36.1 32.1*  ?PLT 429* 385  ? ?BMET ?Recent Labs  ?  11/28/21 ?9179 11/29/21 ?1505  ?NA 141 143  ?K 3.8 3.7  ?CL 101 103  ?CO2 31 35*  ?GLUCOSE 93 88  ?BUN 22* 22*  ?CREATININE 0.53 0.46  ?CALCIUM 8.7* 8.5*  ? ?PT/INR ?Recent Labs  ?  11/28/21 ?1054  ?LABPROT 14.8  ?INR 1.2  ? ?CMP  ?   ?Component Value Date/Time  ? NA 143 11/29/2021 0048  ? K 3.7 11/29/2021 0048  ? CL 103 11/29/2021 0048  ? CO2 35 (H) 11/29/2021 0048  ? GLUCOSE 88 11/29/2021 0048  ? BUN 22 (H) 11/29/2021 0048  ? CREATININE 0.46 11/29/2021 0048  ? CALCIUM 8.5 (L) 11/29/2021 0048  ? GFRNONAA >60 11/29/2021 0048  ? GFRAA  02/27/2010 2026  ?  >60        ?The eGFR has been calculated ?using the MDRD equation. ?This calculation has not been ?validated in all clinical ?situations. ?eGFR's persistently ?<60 mL/min signify ?possible Chronic Kidney Disease.  ? ?Lipase  ?No results  found for: LIPASE ? ? ? ? ?Studies/Results: ?CT IMAGE GUIDED DRAINAGE BY PERCUTANEOUS CATHETER ? ?Result Date: 11/28/2021 ?INDICATION: 61 year old woman with multiple diverticular abscesses presents to IR for abscess drain placement. EXAM: CT GUIDED DRAINAGE OF PELVIC ABSCESSES MEDICATIONS: The patient is currently admitted to the hospital and receiving intravenous antibiotics. The antibiotics were administered within an appropriate time frame prior to the initiation of the procedure. ANESTHESIA/SEDATION: Moderate (conscious) sedation was employed during this procedure. A total of Versed 3 mg and Fentanyl 150 mcg was administered intravenously by the radiology nurse. Total intra-service moderate Sedation Time: 63 minutes. The patient's level of consciousness and vital signs were monitored continuously by radiology nursing throughout the procedure under my direct supervision. COMPLICATIONS: None immediate. PROCEDURE: Informed written consent was obtained from the patient after a thorough discussion of the procedural risks, benefits and alternatives. All questions were addressed. Maximal Sterile Barrier Technique was utilized including caps, mask, sterile gowns, sterile gloves, sterile drape, hand hygiene and skin antiseptic. A timeout was performed prior to the initiation of the procedure. Patient positioned supine on the procedure table. Anterior pelvic wall skin prepped and draped in the usual sterile fashion. Following local administration, 19 gauge Yueh needle advanced into the anterior pelvic abscess utilizing CT guidance. Yueh catheter removed over 0.035 inch Amplatz guidewire. Following serial dilation,  a 12 [place] multipurpose pigtail drain inserted over the guidewire. 900 mL of purulent material aspirated from the anterior pelvic fluid collection. 40 mL sample was sent for Gram stain and culture. Drain secured to skin with suture and connected to bulb suction. Patient repositioned prone on the procedure  table. The left posterior pelvic skin prepped and draped in usual fashion. Following local lidocaine administration, 19 gauge Yueh needle was advanced into the left posterior pelvic abscess utilized CT guidance. Yueh catheter was removed over 0.035 inch Amplatz guidewire. Serial dilation was performed and 10 [place] multipurpose pigtail drain was inserted. 100 mL of purulent material was aspirated from the posterior pelvic fluid collection. Drain was secured to skin with suture and connected to bulb suction. IMPRESSION: 1. 12.0 French multipurpose pigtail drain placed and anterior pelvic abscess. 2. 10.2 French multipurpose pigtail drain placed in left posterior pelvic abscess. Electronically Signed   By: Miachel Roux M.D.   On: 11/28/2021 15:36  ? ?CT IMAGE GUIDED DRAINAGE BY PERCUTANEOUS CATHETER ? ?Result Date: 11/28/2021 ?INDICATION: 61 year old woman with multiple diverticular abscesses presents to IR for abscess drain placement. EXAM: CT GUIDED DRAINAGE OF PELVIC ABSCESSES MEDICATIONS: The patient is currently admitted to the hospital and receiving intravenous antibiotics. The antibiotics were administered within an appropriate time frame prior to the initiation of the procedure. ANESTHESIA/SEDATION: Moderate (conscious) sedation was employed during this procedure. A total of Versed 3 mg and Fentanyl 150 mcg was administered intravenously by the radiology nurse. Total intra-service moderate Sedation Time: 63 minutes. The patient's level of consciousness and vital signs were monitored continuously by radiology nursing throughout the procedure under my direct supervision. COMPLICATIONS: None immediate. PROCEDURE: Informed written consent was obtained from the patient after a thorough discussion of the procedural risks, benefits and alternatives. All questions were addressed. Maximal Sterile Barrier Technique was utilized including caps, mask, sterile gowns, sterile gloves, sterile drape, hand hygiene and skin  antiseptic. A timeout was performed prior to the initiation of the procedure. Patient positioned supine on the procedure table. Anterior pelvic wall skin prepped and draped in the usual sterile fashion. Following local administration, 19 gauge Yueh needle advanced into the anterior pelvic abscess utilizing CT guidance. Yueh catheter removed over 0.035 inch Amplatz guidewire. Following serial dilation, a 12 [place] multipurpose pigtail drain inserted over the guidewire. 900 mL of purulent material aspirated from the anterior pelvic fluid collection. 40 mL sample was sent for Gram stain and culture. Drain secured to skin with suture and connected to bulb suction. Patient repositioned prone on the procedure table. The left posterior pelvic skin prepped and draped in usual fashion. Following local lidocaine administration, 19 gauge Yueh needle was advanced into the left posterior pelvic abscess utilized CT guidance. Yueh catheter was removed over 0.035 inch Amplatz guidewire. Serial dilation was performed and 10 [place] multipurpose pigtail drain was inserted. 100 mL of purulent material was aspirated from the posterior pelvic fluid collection. Drain was secured to skin with suture and connected to bulb suction. IMPRESSION: 1. 12.0 French multipurpose pigtail drain placed and anterior pelvic abscess. 2. 10.2 French multipurpose pigtail drain placed in left posterior pelvic abscess. Electronically Signed   By: Miachel Roux M.D.   On: 11/28/2021 15:36   ? ?Anti-infectives: ?Anti-infectives (From admission, onward)  ? ? Start     Dose/Rate Route Frequency Ordered Stop  ? 11/27/21 2300  piperacillin-tazobactam (ZOSYN) IVPB 3.375 g       ? 3.375 g ?12.5 mL/hr over 240 Minutes Intravenous Every 8 hours  11/27/21 2202 12/04/21 2159  ? ?  ? ? ? ?Assessment/Plan ?Sigmoid diverticulitis with pelvic abscesses ?S/P IR drainage x 2 3/26 ?- afebrile, VSS, WBC 17 from 30 ?- continue NG to LIWS, allow ice chips, await bowel function ?-  monitor JP cultures, large volume of purulence has been drainaged ?- IV Zosyn ?- OOB and mobilize  ? ?FEN: NPO, IVF, NG to LIWS; clamping NG tube to give PO meds is ok. ?ID: Zosyn ?VTE: SCD's, lovenoc ?Foley: no

## 2021-11-29 NOTE — Progress Notes (Signed)
? ? ?Referring Physician(s): ?Dr. Eliot Ford  ? ?Supervising Physician: Marliss Coots ? ?Patient Status:  [facility] - In-pt ? ?Chief Complaint: ? ?Pelvic abscesses s/p  RUQ anterior pelvic drain and left posterior pelvic drain placed  on 3.27.23 by Dr. Bryn Gulling ? ?Subjective: ? ?Patient laying in bed watching television. States that she feels much better than she did yesterday. Expressing some concern regarding going home with drain since she has animals ? ?Allergies: ?Sulfa antibiotics ? ?Medications: ?Prior to Admission medications   ?Medication Sig Start Date End Date Taking? Authorizing Provider  ?risperiDONE (RISPERDAL) 0.5 MG tablet Take 0.5 mg by mouth in the morning.   Yes [provider]  ?risperiDONE (RISPERDAL) 1 MG tablet Take 1.5 mg by mouth at bedtime.   Yes [provider]  ?vitamin B-12 (CYANOCOBALAMIN) 500 MCG tablet Take 500 mcg by mouth daily.   Yes [provider]  ? ? ? ?Vital Signs: ?BP 133/65 (BP Location: Left Arm)   Pulse 81   Temp 98.1 ?F (36.7 ?C)   Resp 18   SpO2 99%  ? ?Physical Exam ?Vitals and nursing note reviewed.  ?Constitutional:   ?   Appearance: She is well-developed.  ?HENT:  ?   Head: Normocephalic and atraumatic.  ?Eyes:  ?   Conjunctiva/sclera: Conjunctivae normal.  ?Pulmonary:  ?   Effort: Pulmonary effort is normal.  ?Abdominal:  ?   Comments:  ?Drain Location: RUQ, anterior pelvic ?Size: Fr size: 10 Fr ?Date of placement: 3.26.23  ?Currently to: Drain collection device: suction bulb ?10 ml of purulent output noted to be in the JP drain ? ?Drain Location: left posterior  pelvic ?Size: Fr size: 12 Fr ?Date of placement: 3.26.23  ?Currently to: Drain collection device: suction bulb ?1 ml of purulent output noted to be in the JP drain  ?Musculoskeletal:     ?   General: Normal range of motion.  ?   Cervical back: Normal range of motion.  ?Skin: ?   General: Skin is warm.  ?Neurological:  ?   Mental Status: She is alert and oriented to person, place, and  time.  ? ? ?Imaging: ?CT IMAGE GUIDED DRAINAGE BY PERCUTANEOUS CATHETER ? ?Result Date: 11/28/2021 ?INDICATION: 61 year old woman with multiple diverticular abscesses presents to IR for abscess drain placement. EXAM: CT GUIDED DRAINAGE OF PELVIC ABSCESSES MEDICATIONS: The patient is currently admitted to the hospital and receiving intravenous antibiotics. The antibiotics were administered within an appropriate time frame prior to the initiation of the procedure. ANESTHESIA/SEDATION: Moderate (conscious) sedation was employed during this procedure. A total of Versed 3 mg and Fentanyl 150 mcg was administered intravenously by the radiology nurse. Total intra-service moderate Sedation Time: 63 minutes. The patient's level of consciousness and vital signs were monitored continuously by radiology nursing throughout the procedure under my direct supervision. COMPLICATIONS: None immediate. PROCEDURE: Informed written consent was obtained from the patient after a thorough discussion of the procedural risks, benefits and alternatives. All questions were addressed. Maximal Sterile Barrier Technique was utilized including caps, mask, sterile gowns, sterile gloves, sterile drape, hand hygiene and skin antiseptic. A timeout was performed prior to the initiation of the procedure. Patient positioned supine on the procedure table. Anterior pelvic wall skin prepped and draped in the usual sterile fashion. Following local administration, 19 gauge Yueh needle advanced into the anterior pelvic abscess utilizing CT guidance. Yueh catheter removed over 0.035 inch Amplatz guidewire. Following serial dilation, a 12 [place] multipurpose pigtail drain inserted over the guidewire. 900 mL  of purulent material aspirated from the anterior pelvic fluid collection. 40 mL sample was sent for Gram stain and culture. Drain secured to skin with suture and connected to bulb suction. Patient repositioned prone on the procedure table. The left posterior  pelvic skin prepped and draped in usual fashion. Following local lidocaine administration, 19 gauge Yueh needle was advanced into the left posterior pelvic abscess utilized CT guidance. Yueh catheter was removed over 0.035 inch Amplatz guidewire. Serial dilation was performed and 10 [place] multipurpose pigtail drain was inserted. 100 mL of purulent material was aspirated from the posterior pelvic fluid collection. Drain was secured to skin with suture and connected to bulb suction. IMPRESSION: 1. 12.0 French multipurpose pigtail drain placed and anterior pelvic abscess. 2. 10.2 French multipurpose pigtail drain placed in left posterior pelvic abscess. Electronically Signed   By: Acquanetta BellingFarhaan  Mir M.D.   On: 11/28/2021 15:36  ? ?CT IMAGE GUIDED DRAINAGE BY PERCUTANEOUS CATHETER ? ?Result Date: 11/28/2021 ?INDICATION: 61 year old woman with multiple diverticular abscesses presents to IR for abscess drain placement. EXAM: CT GUIDED DRAINAGE OF PELVIC ABSCESSES MEDICATIONS: The patient is currently admitted to the hospital and receiving intravenous antibiotics. The antibiotics were administered within an appropriate time frame prior to the initiation of the procedure. ANESTHESIA/SEDATION: Moderate (conscious) sedation was employed during this procedure. A total of Versed 3 mg and Fentanyl 150 mcg was administered intravenously by the radiology nurse. Total intra-service moderate Sedation Time: 63 minutes. The patient's level of consciousness and vital signs were monitored continuously by radiology nursing throughout the procedure under my direct supervision. COMPLICATIONS: None immediate. PROCEDURE: Informed written consent was obtained from the patient after a thorough discussion of the procedural risks, benefits and alternatives. All questions were addressed. Maximal Sterile Barrier Technique was utilized including caps, mask, sterile gowns, sterile gloves, sterile drape, hand hygiene and skin antiseptic. A timeout was  performed prior to the initiation of the procedure. Patient positioned supine on the procedure table. Anterior pelvic wall skin prepped and draped in the usual sterile fashion. Following local administration, 19 gauge Yueh needle advanced into the anterior pelvic abscess utilizing CT guidance. Yueh catheter removed over 0.035 inch Amplatz guidewire. Following serial dilation, a 12 [place] multipurpose pigtail drain inserted over the guidewire. 900 mL of purulent material aspirated from the anterior pelvic fluid collection. 40 mL sample was sent for Gram stain and culture. Drain secured to skin with suture and connected to bulb suction. Patient repositioned prone on the procedure table. The left posterior pelvic skin prepped and draped in usual fashion. Following local lidocaine administration, 19 gauge Yueh needle was advanced into the left posterior pelvic abscess utilized CT guidance. Yueh catheter was removed over 0.035 inch Amplatz guidewire. Serial dilation was performed and 10 [place] multipurpose pigtail drain was inserted. 100 mL of purulent material was aspirated from the posterior pelvic fluid collection. Drain was secured to skin with suture and connected to bulb suction. IMPRESSION: 1. 12.0 French multipurpose pigtail drain placed and anterior pelvic abscess. 2. 10.2 French multipurpose pigtail drain placed in left posterior pelvic abscess. Electronically Signed   By: Acquanetta BellingFarhaan  Mir M.D.   On: 11/28/2021 15:36   ? ?Labs: ? ?CBC: ?Recent Labs  ?  04/01/21 ?1505 11/28/21 ?[id number] 11/29/21 ?[id number]  ?WBC 11.1* 30.5* 17.6*  ?HGB 16.0* 11.9* 10.6*  ?HCT 49.1* 36.1 32.1*  ?PLT 278 429* 385  ? ? ?COAGS: ?Recent Labs  ?  11/28/21 ?1054  ?INR 1.2  ? ? ?BMP: ?Recent Labs  ?  04/01/21 ?  1505 11/28/21 ?3557 11/29/21 ?3220  ?NA 141 141 143  ?K 4.0 3.8 3.7  ?CL 102 101 103  ?CO2 29 31 35*  ?GLUCOSE 86 93 88  ?BUN 16 22* 22*  ?CALCIUM 10.2 8.7* 8.5*  ?CREATININE 0.66 0.53 0.46  ?GFRNONAA >60 >60 >60  ? ? ? ? ?Assessment and  Plan: ? ?61 y.o. female inpatient. History of bipolar disorder. Presented to the ED at [facility] on 3.24.23 with abdominal pain and constipation X 2 weeks. Patient was found to have sigmoid diverticulitis and pelvi

## 2021-11-29 NOTE — Progress Notes (Addendum)
Mobility Specialist Progress Note: ? ? 11/29/21 1508  ?Mobility  ?Activity Ambulated with assistance in room;Transferred from chair to bed  ?Level of Assistance Modified independent, requires aide device or extra time  ?Assistive Device None  ?Distance Ambulated (ft) 8 ft  ?Activity Response Tolerated fair  ?$Mobility charge 1 Mobility  ? ?Pt received in chair asking to go back to bed. Complaints of abdominal pain. Left in bed with call bell in reach and all needs met.  ? ?Judith Miller ?Mobility Specialist ?Primary Phone [phone] ? ?

## 2021-11-30 LAB — CBC
HCT: 32.1 % — ABNORMAL LOW (ref 36.0–46.0)
Hemoglobin: 10.6 g/dL — ABNORMAL LOW (ref 12.0–15.0)
MCH: 29 pg (ref 26.0–34.0)
MCHC: 33 g/dL (ref 30.0–36.0)
MCV: 87.7 fL (ref 80.0–100.0)
Platelets: 408 10*3/uL — ABNORMAL HIGH (ref 150–400)
RBC: 3.66 MIL/uL — ABNORMAL LOW (ref 3.87–5.11)
RDW: 14.6 % (ref 11.5–15.5)
WBC: 16.9 10*3/uL — ABNORMAL HIGH (ref 4.0–10.5)
nRBC: 0 % (ref 0.0–0.2)

## 2021-11-30 LAB — BASIC METABOLIC PANEL
Anion gap: 7 (ref 5–15)
BUN: 18 mg/dL (ref 6–20)
CO2: 36 mmol/L — ABNORMAL HIGH (ref 22–32)
Calcium: 8.5 mg/dL — ABNORMAL LOW (ref 8.9–10.3)
Chloride: 101 mmol/L (ref 98–111)
Creatinine, Ser: 0.53 mg/dL (ref 0.44–1.00)
GFR, Estimated: >60 mL/min (ref >60)
Glucose, Bld: 72 mg/dL (ref 70–99)
Potassium: 4.7 mmol/L (ref 3.5–5.1)
Sodium: 144 mmol/L (ref 135–145)

## 2021-11-30 MED ORDER — NICOTINE 14 MG/24HR TD PT24
14.0000 mg | MEDICATED_PATCH | Freq: Every day | TRANSDERMAL | Status: DC
  Administered 2021-11-30 – 2021-12-13 (×14): 14 mg via TRANSDERMAL
  Filled 2021-11-30 (×14): qty 1

## 2021-11-30 NOTE — Progress Notes (Signed)
[facility] ?Progress Note ? ?   ?Subjective: ?CC:  ?NAEO.Pain overall controlled. Reports small amt flatus. No BM yet. Some distention.  ? ?Objective: ?Vital signs in last 24 hours: ?Temp:  [98 ?F (36.7 ?C)-99.5 ?F (37.5 ?C)] 98.4 ?F (36.9 ?C) (03/28 0741) ?Pulse Rate:  [81-90] 82 (03/28 0741) ?Resp:  [17-18] 17 (03/28 0741) ?BP: (133-144)/(60-73) 144/60 (03/28 0741) ?SpO2:  [90 %-99 %] 91 % (03/28 0741) ?Last BM Date : 11/25/21 ? ?Intake/Output from previous day: ?03/27 0701 - 03/28 0700 ?In: 153.2 [IV Piggyback:153.2] ?Out: 7517 [Urine:700; Emesis/NG output:395; Drains:120] ?Intake/Output this shift: ?No intake/output data recorded. ? ?PE: ?Gen:  Alert, NAD, pleasant ?Card:  Regular rate and rhythm, pedal pulses 2+ BL ?Pulm:  Normal effort, clear to auscultation bilaterally ?Abd: Soft, appropriately tender, NG in place draining green bilious fluid, drains with purulent material in bulbs. ?Skin: warm and dry, no rashes  ?Psych: A&Ox3  ? ?Lab Results:  ?Recent Labs  ?  11/29/21 ?0017 11/30/21 ?0050  ?WBC 17.6* 16.9*  ?HGB 10.6* 10.6*  ?HCT 32.1* 32.1*  ?PLT 385 408*  ? ?BMET ?Recent Labs  ?  11/29/21 ?4944 11/30/21 ?0050  ?NA 143 144  ?K 3.7 4.7  ?CL 103 101  ?CO2 35* 36*  ?GLUCOSE 88 72  ?BUN 22* 18  ?CREATININE 0.46 0.53  ?CALCIUM 8.5* 8.5*  ? ?PT/INR ?Recent Labs  ?  11/28/21 ?1054  ?LABPROT 14.8  ?INR 1.2  ? ?CMP  ?   ?Component Value Date/Time  ? NA 144 11/30/2021 0050  ? K 4.7 11/30/2021 0050  ? CL 101 11/30/2021 0050  ? CO2 36 (H) 11/30/2021 0050  ? GLUCOSE 72 11/30/2021 0050  ? BUN 18 11/30/2021 0050  ? CREATININE 0.53 11/30/2021 0050  ? CALCIUM 8.5 (L) 11/30/2021 0050  ? GFRNONAA >60 11/30/2021 0050  ? GFRAA  02/27/2010 2026  ?  >60        ?The eGFR has been calculated ?using the MDRD equation. ?This calculation has not been ?validated in all clinical ?situations. ?eGFR's persistently ?<60 mL/min signify ?possible Chronic Kidney Disease.  ? ?Lipase  ?No results found for:  LIPASE ? ? ? ? ?Studies/Results: ?CT IMAGE GUIDED DRAINAGE BY PERCUTANEOUS CATHETER ? ?Result Date: 11/28/2021 ?INDICATION: 61 year old woman with multiple diverticular abscesses presents to IR for abscess drain placement. EXAM: CT GUIDED DRAINAGE OF PELVIC ABSCESSES MEDICATIONS: The patient is currently admitted to the hospital and receiving intravenous antibiotics. The antibiotics were administered within an appropriate time frame prior to the initiation of the procedure. ANESTHESIA/SEDATION: Moderate (conscious) sedation was employed during this procedure. A total of Versed 3 mg and Fentanyl 150 mcg was administered intravenously by the radiology nurse. Total intra-service moderate Sedation Time: 63 minutes. The patient's level of consciousness and vital signs were monitored continuously by radiology nursing throughout the procedure under my direct supervision. COMPLICATIONS: None immediate. PROCEDURE: Informed written consent was obtained from the patient after a thorough discussion of the procedural risks, benefits and alternatives. All questions were addressed. Maximal Sterile Barrier Technique was utilized including caps, mask, sterile gowns, sterile gloves, sterile drape, hand hygiene and skin antiseptic. A timeout was performed prior to the initiation of the procedure. Patient positioned supine on the procedure table. Anterior pelvic wall skin prepped and draped in the usual sterile fashion. Following local administration, 19 gauge Yueh needle advanced into the anterior pelvic abscess utilizing CT guidance. Yueh catheter removed over 0.035 inch Amplatz guidewire. Following serial dilation, a 12 [place] multipurpose pigtail drain inserted  over the guidewire. 900 mL of purulent material aspirated from the anterior pelvic fluid collection. 40 mL sample was sent for Gram stain and culture. Drain secured to skin with suture and connected to bulb suction. Patient repositioned prone on the procedure table. The left  posterior pelvic skin prepped and draped in usual fashion. Following local lidocaine administration, 19 gauge Yueh needle was advanced into the left posterior pelvic abscess utilized CT guidance. Yueh catheter was removed over 0.035 inch Amplatz guidewire. Serial dilation was performed and 10 [place] multipurpose pigtail drain was inserted. 100 mL of purulent material was aspirated from the posterior pelvic fluid collection. Drain was secured to skin with suture and connected to bulb suction. IMPRESSION: 1. 12.0 French multipurpose pigtail drain placed and anterior pelvic abscess. 2. 10.2 French multipurpose pigtail drain placed in left posterior pelvic abscess. Electronically Signed   By: Miachel Roux M.D.   On: 11/28/2021 15:36  ? ?CT IMAGE GUIDED DRAINAGE BY PERCUTANEOUS CATHETER ? ?Result Date: 11/28/2021 ?INDICATION: 61 year old woman with multiple diverticular abscesses presents to IR for abscess drain placement. EXAM: CT GUIDED DRAINAGE OF PELVIC ABSCESSES MEDICATIONS: The patient is currently admitted to the hospital and receiving intravenous antibiotics. The antibiotics were administered within an appropriate time frame prior to the initiation of the procedure. ANESTHESIA/SEDATION: Moderate (conscious) sedation was employed during this procedure. A total of Versed 3 mg and Fentanyl 150 mcg was administered intravenously by the radiology nurse. Total intra-service moderate Sedation Time: 63 minutes. The patient's level of consciousness and vital signs were monitored continuously by radiology nursing throughout the procedure under my direct supervision. COMPLICATIONS: None immediate. PROCEDURE: Informed written consent was obtained from the patient after a thorough discussion of the procedural risks, benefits and alternatives. All questions were addressed. Maximal Sterile Barrier Technique was utilized including caps, mask, sterile gowns, sterile gloves, sterile drape, hand hygiene and skin antiseptic. A  timeout was performed prior to the initiation of the procedure. Patient positioned supine on the procedure table. Anterior pelvic wall skin prepped and draped in the usual sterile fashion. Following local administration, 19 gauge Yueh needle advanced into the anterior pelvic abscess utilizing CT guidance. Yueh catheter removed over 0.035 inch Amplatz guidewire. Following serial dilation, a 12 [place] multipurpose pigtail drain inserted over the guidewire. 900 mL of purulent material aspirated from the anterior pelvic fluid collection. 40 mL sample was sent for Gram stain and culture. Drain secured to skin with suture and connected to bulb suction. Patient repositioned prone on the procedure table. The left posterior pelvic skin prepped and draped in usual fashion. Following local lidocaine administration, 19 gauge Yueh needle was advanced into the left posterior pelvic abscess utilized CT guidance. Yueh catheter was removed over 0.035 inch Amplatz guidewire. Serial dilation was performed and 10 [place] multipurpose pigtail drain was inserted. 100 mL of purulent material was aspirated from the posterior pelvic fluid collection. Drain was secured to skin with suture and connected to bulb suction. IMPRESSION: 1. 12.0 French multipurpose pigtail drain placed and anterior pelvic abscess. 2. 10.2 French multipurpose pigtail drain placed in left posterior pelvic abscess. Electronically Signed   By: Miachel Roux M.D.   On: 11/28/2021 15:36   ? ?Anti-infectives: ?Anti-infectives (From admission, onward)  ? ? Start     Dose/Rate Route Frequency Ordered Stop  ? 11/27/21 2300  piperacillin-tazobactam (ZOSYN) IVPB 3.375 g       ? 3.375 g ?12.5 mL/hr over 240 Minutes Intravenous Every 8 hours 11/27/21 2202 12/04/21 2159  ? ?  ? ? ? ?  Assessment/Plan ?Sigmoid diverticulitis with pelvic abscesses ?S/P IR drainage x 2 3/26 ?- afebrile, VSS, WBC 17 from 30 ?- NGT with <400 cc/24h documented; clamp trial today ?- monitor JP cultures,  re-incubated for better growth. ?- IV Zosyn ?- OOB and mobilize  ? ?FEN: NPO, IVF, NG clamped by me this AM around 0900 ?ID: Zosyn ?VTE: SCD's, lovenoc ?Foley: none ? ?Bipolar disorder, home meds.  ? ? LOS: 3 days  ? ?I reviewed n

## 2021-11-30 NOTE — Plan of Care (Incomplete)
  Problem: Safety: Goal: Ability to remain free from injury will improve Outcome: Progressing   Problem: Skin Integrity: Goal: Risk for impaired skin integrity will decrease Outcome: Progressing   Problem: Pain Managment: Goal: General experience of comfort will improve Outcome: Not Progressing   

## 2021-11-30 NOTE — Progress Notes (Signed)
Mobility Specialist Progress Note: ? ? 11/30/21 1055  ?Mobility  ?Activity Ambulated with assistance in room  ?Level of Assistance Modified independent, requires aide device or extra time  ?Assistive Device None  ?Distance Ambulated (ft) 40 ft  ?Activity Response Tolerated fair  ?$Mobility charge 1 Mobility  ? ?Pt received in bed and with encouragement was willing to participate in mobility. Complaints of 8/10 low abdomen pain. Left in bed with call bell in reach and all needs met.  ? ?Judith Miller ?Mobility Specialist ?Primary Phone [phone] ? ?

## 2021-11-30 NOTE — Plan of Care (Signed)
  Problem: Pain Managment: Goal: General experience of comfort will improve Outcome: Progressing   Problem: Safety: Goal: Ability to remain free from injury will improve Outcome: Progressing   

## 2021-11-30 NOTE — TOC Progression Note (Signed)
Transition of Care (TOC) - Progression Note  ? ? ?Patient Details  ?Name: MINIE ROADCAP ?MRN: [id number] ?Date of Birth: 05-25-1961 ? ?Transition of Care (TOC) CM/SW Contact  ?Kingsley Plan, RN ?Phone Number: ?11/30/2021, 10:51 AM ? ?Clinical Narrative:    ? ? ?Patient active with [facility], changed pharmacy to [facility]  ? ?Transition of Care (TOC) Screening Note ? ? ?Patient Details  ?Name: SKYLINN VIALPANDO ?Date of Birth: 1960/11/03 ? ? ? ? ?Transition of Care Department [facility]) has reviewed patient and no TOC needs have been identified at this time. We will continue to monitor patient advancement through interdisciplinary progression rounds. If new patient transition needs arise, please place a TOC consult. ?  ? ?  ?  ? ?Expected Discharge Plan and Services ?  ?  ?  ?  ?  ?                ?  ?  ?  ?  ?  ?  ?  ?  ?  ?  ? ? ?Social Determinants of Health (SDOH) Interventions ?  ? ?Readmission Risk Interventions ?   ? View : No data to display.  ?  ?  ?  ? ? ?

## 2021-11-30 NOTE — Plan of Care (Signed)
?  Problem: Safety: ?Goal: Ability to remain free from injury will improve ?11/30/2021 2126 by Bernette Mayers, RN ?Outcome: Progressing ?11/30/2021 2126 by Bernette Mayers, RN ?Outcome: Progressing ?  ?Problem: Skin Integrity: ?Goal: Risk for impaired skin integrity will decrease ?11/30/2021 2126 by Bernette Mayers, RN ?Outcome: Progressing ?11/30/2021 2126 by Bernette Mayers, RN ?Outcome: Progressing ?  ?Problem: Pain Managment: ?Goal: General experience of comfort will improve ?11/30/2021 2126 by Bernette Mayers, RN ?Outcome: Not Progressing ?11/30/2021 2126 by Bernette Mayers, RN ?Outcome: Not Progressing ?  ?

## 2021-11-30 NOTE — Progress Notes (Signed)
Patient ID: Judith Miller, female   DOB: [birth year], 61 y.o.   MRN: [id number] ?Patient requested NG tube to remain in place until morning. States insertion was very traumatic and she wants to be sure it won't be needed again before removal. Educated patient. Patient still requests to wait. ? ?Haydee Salter, RN ? ?

## 2021-11-30 NOTE — Progress Notes (Signed)
? ? ?Referring Physician(s): ?Dr. Eliot Ford  ? ?Supervising Physician: Dr. Milford Cage ? ?Patient Status:  [facility] [place] - In-pt ? ?Chief Complaint: ? ?Pelvic abscesses s/p  RUQ anterior pelvic drain and left posterior pelvic drain placed  3/26 by Dr. Bryn Gulling ? ?Subjective: ? ?Patient resting comfortably. ?Feeling a bit better ?Still has NGT ? ?Allergies: ?Sulfa antibiotics ? ?Medications: ? ?Current Facility-Administered Medications:  ?  enoxaparin (LOVENOX) injection 40 mg, 40 mg, Subcutaneous, Q24H, Fritzi Mandes, MD, 40 mg at 11/29/21 2106 ?  HYDROmorphone (DILAUDID) injection 0.5-1 mg, 0.5-1 mg, Intravenous, Q2H PRN, Fritzi Mandes, MD, 1 mg at 11/30/21 0859 ?  lactated ringers infusion, , Intravenous, Continuous, Fritzi Mandes, MD, Last Rate: 100 mL/hr at 11/30/21 0732, New Bag at 11/30/21 0732 ?  lip balm (CARMEX) ointment, , Topical, PRN, Fritzi Mandes, MD, 75 application. at 11/28/21 0246 ?  ondansetron (ZOFRAN-ODT) disintegrating tablet 4 mg, 4 mg, Oral, Q6H PRN **OR** ondansetron (ZOFRAN) injection 4 mg, 4 mg, Intravenous, Q6H PRN, Fritzi Mandes, MD ?  piperacillin-tazobactam (ZOSYN) IVPB 3.375 g, 3.375 g, Intravenous, Q8H, Fritzi Mandes, MD, Last Rate: 12.5 mL/hr at 11/30/21 0539, 3.375 g at 11/30/21 0539 ?  risperiDONE (RISPERDAL M-TABS) disintegrating tablet 0.5 mg, 0.5 mg, Oral, q AM, Fritzi Mandes, MD, 0.5 mg at 11/30/21 5859 ?  risperiDONE (RISPERDAL M-TABS) disintegrating tablet 1.5 mg, 1.5 mg, Oral, QHS, Sophronia Simas L, MD, 1.5 mg at 11/29/21 2105] ? ? ? ?Vital Signs: ?BP (!) 144/60 (BP Location: Left Arm)   Pulse 82   Temp 98.4 ?F (36.9 ?C) (Oral)   Resp 17   SpO2 91%  ? ?Physical Exam ?Vitals and nursing note reviewed.  ?Constitutional:   ?   Appearance: She is well-developed.  ?Eyes:  ?   Conjunctiva/sclera: Conjunctivae normal.  ?Pulmonary:  ?   Effort: Pulmonary effort is normal.  ?Abdominal:  ?   Comments:  ?Drain Location: RUQ, anterior pelvic ?Size: Fr size: 10 Fr ?Date of placement:  11/28/21  ?Currently to: Drain collection device: suction bulb ?Purulent output noted to be in the JP drain ? ?Drain Location: left posterior  pelvic ?Size: Fr size: 12 Fr ?Date of placement: 11/28/21  ?Currently to: Drain collection device: suction bulb ?Purulent output noted to be in the JP drain  ?Musculoskeletal:  ?   Cervical back: Normal range of motion.  ?Skin: ?   General: Skin is warm.  ?Neurological:  ?   Mental Status: She is alert and oriented to person, place, and time.  ? ? ?Imaging: ?CT IMAGE GUIDED DRAINAGE BY PERCUTANEOUS CATHETER ? ?Result Date: 11/28/2021 ?INDICATION: 61 year old woman with multiple diverticular abscesses presents to IR for abscess drain placement. EXAM: CT GUIDED DRAINAGE OF PELVIC ABSCESSES MEDICATIONS: The patient is currently admitted to the hospital and receiving intravenous antibiotics. The antibiotics were administered within an appropriate time frame prior to the initiation of the procedure. ANESTHESIA/SEDATION: Moderate (conscious) sedation was employed during this procedure. A total of Versed 3 mg and Fentanyl 150 mcg was administered intravenously by the radiology nurse. Total intra-service moderate Sedation Time: 63 minutes. The patient's level of consciousness and vital signs were monitored continuously by radiology nursing throughout the procedure under my direct supervision. COMPLICATIONS: None immediate. PROCEDURE: Informed written consent was obtained from the patient after a thorough discussion of the procedural risks, benefits and alternatives. All questions were addressed. Maximal Sterile Barrier Technique was utilized including caps, mask, sterile gowns, sterile gloves, sterile drape, hand hygiene and skin antiseptic.  A timeout was performed prior to the initiation of the procedure. Patient positioned supine on the procedure table. Anterior pelvic wall skin prepped and draped in the usual sterile fashion. Following local administration, 19 gauge Yueh needle  advanced into the anterior pelvic abscess utilizing CT guidance. Yueh catheter removed over 0.035 inch Amplatz guidewire. Following serial dilation, a 12 [place] multipurpose pigtail drain inserted over the guidewire. 900 mL of purulent material aspirated from the anterior pelvic fluid collection. 40 mL sample was sent for Gram stain and culture. Drain secured to skin with suture and connected to bulb suction. Patient repositioned prone on the procedure table. The left posterior pelvic skin prepped and draped in usual fashion. Following local lidocaine administration, 19 gauge Yueh needle was advanced into the left posterior pelvic abscess utilized CT guidance. Yueh catheter was removed over 0.035 inch Amplatz guidewire. Serial dilation was performed and 10 [place] multipurpose pigtail drain was inserted. 100 mL of purulent material was aspirated from the posterior pelvic fluid collection. Drain was secured to skin with suture and connected to bulb suction. IMPRESSION: 1. 12.0 French multipurpose pigtail drain placed and anterior pelvic abscess. 2. 10.2 French multipurpose pigtail drain placed in left posterior pelvic abscess. Electronically Signed   By: Acquanetta BellingFarhaan  Mir M.D.   On: 11/28/2021 15:36  ? ?CT IMAGE GUIDED DRAINAGE BY PERCUTANEOUS CATHETER ? ?Result Date: 11/28/2021 ?INDICATION: 61 year old woman with multiple diverticular abscesses presents to IR for abscess drain placement. EXAM: CT GUIDED DRAINAGE OF PELVIC ABSCESSES MEDICATIONS: The patient is currently admitted to the hospital and receiving intravenous antibiotics. The antibiotics were administered within an appropriate time frame prior to the initiation of the procedure. ANESTHESIA/SEDATION: Moderate (conscious) sedation was employed during this procedure. A total of Versed 3 mg and Fentanyl 150 mcg was administered intravenously by the radiology nurse. Total intra-service moderate Sedation Time: 63 minutes. The patient's level of consciousness and vital  signs were monitored continuously by radiology nursing throughout the procedure under my direct supervision. COMPLICATIONS: None immediate. PROCEDURE: Informed written consent was obtained from the patient after a thorough discussion of the procedural risks, benefits and alternatives. All questions were addressed. Maximal Sterile Barrier Technique was utilized including caps, mask, sterile gowns, sterile gloves, sterile drape, hand hygiene and skin antiseptic. A timeout was performed prior to the initiation of the procedure. Patient positioned supine on the procedure table. Anterior pelvic wall skin prepped and draped in the usual sterile fashion. Following local administration, 19 gauge Yueh needle advanced into the anterior pelvic abscess utilizing CT guidance. Yueh catheter removed over 0.035 inch Amplatz guidewire. Following serial dilation, a 12 [place] multipurpose pigtail drain inserted over the guidewire. 900 mL of purulent material aspirated from the anterior pelvic fluid collection. 40 mL sample was sent for Gram stain and culture. Drain secured to skin with suture and connected to bulb suction. Patient repositioned prone on the procedure table. The left posterior pelvic skin prepped and draped in usual fashion. Following local lidocaine administration, 19 gauge Yueh needle was advanced into the left posterior pelvic abscess utilized CT guidance. Yueh catheter was removed over 0.035 inch Amplatz guidewire. Serial dilation was performed and 10 [place] multipurpose pigtail drain was inserted. 100 mL of purulent material was aspirated from the posterior pelvic fluid collection. Drain was secured to skin with suture and connected to bulb suction. IMPRESSION: 1. 12.0 French multipurpose pigtail drain placed and anterior pelvic abscess. 2. 10.2 French multipurpose pigtail drain placed in left posterior pelvic abscess. Electronically Signed   By: Mauri ReadingFarhaan  Mir M.D.   On: 11/28/2021 15:36   ? ?Labs: ? ?CBC: ?Recent  Labs  ?  04/01/21 ?1505 11/28/21 ?4081 11/29/21 ?4481 11/30/21 ?0050  ?WBC 11.1* 30.5* 17.6* 16.9*  ?HGB 16.0* 11.9* 10.6* 10.6*  ?HCT 49.1* 36.1 32.1* 32.1*  ?PLT 278 429* 385 408*  ? ? ? ?COAGS: ?Recent Labs  ?  0

## 2021-12-01 LAB — CBC
HCT: 32.3 % — ABNORMAL LOW (ref 36.0–46.0)
Hemoglobin: 10.5 g/dL — ABNORMAL LOW (ref 12.0–15.0)
MCH: 28.2 pg (ref 26.0–34.0)
MCHC: 32.5 g/dL (ref 30.0–36.0)
MCV: 86.8 fL (ref 80.0–100.0)
Platelets: 396 10*3/uL (ref 150–400)
RBC: 3.72 MIL/uL — ABNORMAL LOW (ref 3.87–5.11)
RDW: 14.3 % (ref 11.5–15.5)
WBC: 18.4 10*3/uL — ABNORMAL HIGH (ref 4.0–10.5)
nRBC: 0 % (ref 0.0–0.2)

## 2021-12-01 MED ORDER — ADULT MULTIVITAMIN W/MINERALS CH
1.0000 | ORAL_TABLET | Freq: Every day | ORAL | Status: DC
  Administered 2021-12-01 – 2021-12-14 (×14): 1 via ORAL
  Filled 2021-12-01 (×14): qty 1

## 2021-12-01 MED ORDER — BOOST / RESOURCE BREEZE PO LIQD CUSTOM
1.0000 | Freq: Three times a day (TID) | ORAL | Status: DC
  Administered 2021-12-01 – 2021-12-14 (×32): 1 via ORAL

## 2021-12-01 NOTE — Progress Notes (Signed)
Mobility Specialist Progress Note: ? ? 12/01/21 1400  ?Mobility  ?Activity Ambulated with assistance to bathroom  ?Level of Assistance Standby assist, set-up cues, supervision of patient - no hands on  ?Assistive Device  ?(IV pole)  ?Distance Ambulated (ft) 20 ft  ?Activity Response Tolerated well  ?$Mobility charge 1 Mobility  ? ?Pt received in bathroom needing to get back to bed. Complaints of incision pain. Left in bed with call bell in reach and all needs met.   ? ?Judith Miller ?Mobility Specialist ?Primary Phone [phone] ? ?

## 2021-12-01 NOTE — Plan of Care (Signed)
  Problem: Activity: Goal: Risk for activity intolerance will decrease Outcome: Progressing   Problem: Pain Managment: Goal: General experience of comfort will improve Outcome: Progressing   Problem: Safety: Goal: Ability to remain free from injury will improve Outcome: Progressing   

## 2021-12-01 NOTE — Progress Notes (Addendum)
Initial Nutrition Assessment ? ?DOCUMENTATION CODES:  ? ?Not applicable ? ?INTERVENTION:  ?Boost Breeze po TID, each supplement provides 250 kcal and 9 grams of protein ? ?Recommend Ensure Enlive po BID once diet advanced, each supplement provides 350 kcal and 20 grams of protein ? ?MVI with minerals daily ? ? ?NUTRITION DIAGNOSIS:  ? ?Inadequate oral intake related to other (see comment) (restricted diet) as evidenced by NPO status (NPO status x 4 days). ? ? ?GOAL:  ? ?Patient will meet greater than or equal to 90% of their needs ? ? ?MONITOR:  ? ?PO intake, Supplement acceptance, Diet advancement, Labs, Weight trends, I & O's ? ?REASON FOR ASSESSMENT:  ? ?NPO/Clear Liquid Diet ?  ? ?ASSESSMENT:  ? ?Patient is a 61 year old female who presented as a transfer from [facility] with a perforated sigmoid colon. Recent admission on 3/13 for pancreatitis and presented again on 3/17 with ongoing abdominal pain and constipation and elevated WBC and discharged home with a bowel regimen. Past medical history includes bipolar disorder, deviated septum ? ?3/25 - NPO  ?3/26 - RUQ anterior pelvic drain and left posterior pelvic drain placed ?3/29 - Diet advanced to clear liquid  ? ?Per chart review, plan is to remove NGT today if tolerating clears.  ? ?Met with pt at bedside. Per pt, pt endorses a good appetite today and was asking for food upon dietetic intern entering room. Pt reports that for breakfast today she consumed 100% of jello, beef broth and grape juice. Pt was waiting for her lunch tray to be delivered. Pt denies any nausea or vomiting at this time and reports minimal abdominal pain in lower abdomen area. Prior to admission, pt reports that she typically consume 2-3 small meals a day. Pt reports that for breakfast she has peanut butter crackers or a granola bar prior to heading to work. For lunch, pt reports that she eats a [place] sandwich or gets something from the vending machine at work. For dinner, pt reports  that she typically eats a TV dinner from the grocery store and states that she does not cook. Pt reports that she drinks 1 protein shake a day at home, pt could not recall the brand. She also states that sometimes she will put some of the protein shake into her coffee in the mornings. Pt unsure of usual body weight but reports that when they weighed her the other day she was 180#. Dietetic intern performed a bed weight on pt and pt's weight was 81.7 kg per bed weight.  ? ?Dietetic intern checked nourishment refrigerator on floor and provided pt a peach Boost Breeze, per pt request. Pt drinking during visit. Discussed importance of adequate PO intake and nutritional supplements to aid in caloric and protein intake. Will order Boost Breeze for pt and recommend Ensure enlive chocolate-flavored once diet advanced, pt agreeable to this plan.  ? ?Per weight history, no admission or current weight documented. Last weight recorded on 04/01/21 at 83 kg. Dietetic intern to request new measured weight.  ? ?Labs reviewed.  ? ?Medications reviewed and include: zosyn ? ? ?NUTRITION - FOCUSED PHYSICAL EXAM: ? ?Flowsheet Row Most Recent Value  ?Orbital Region No depletion  ?Upper Arm Region No depletion  ?Thoracic and Lumbar Region No depletion  ?Buccal Region No depletion  ?Temple Region Mild depletion  ?Clavicle Bone Region No depletion  ?Clavicle and Acromion Bone Region No depletion  ?Scapular Bone Region Mild depletion  ?Dorsal Hand No depletion  ?Patellar Region  No depletion  ?Anterior Thigh Region Mild depletion  ?Posterior Calf Region Mild depletion  ?Edema (RD Assessment) None  ?Hair Reviewed  ?Eyes Reviewed  ?Mouth Reviewed  ?Skin Reviewed  ?Nails Reviewed  ? ?  ? ? ?Diet Order:   ?Diet Order   ? ?       ?  Diet clear liquid Room service appropriate? Yes; Fluid consistency: Thin  Diet effective now       ?  ? ?  ?  ? ?  ? ? ?EDUCATION NEEDS:  ? ?Education needs have been addressed ? ?Skin:  Skin Assessment: Reviewed RN  Assessment ? ?Last BM:  3/28; type 7 ? ?Height:  ? ?Ht Readings from Last 1 Encounters:  ?04/01/21 5' 7.25" (1.708 m)  ? ? ?Weight: 79.4 kg  ? ?Wt Readings from Last 1 Encounters:  ?04/01/21 83 kg  ? ? ?Ideal Body Weight:  61.9 kg ? ?BMI: 27.20 ? ?Estimated Nutritional Needs:  ? ?Kcal:  2000 - 2200 ? ?Protein:  100 - 115 grams ? ?Fluid:  >/= 2 L ? ? ? ?Maryruth Hancock, Dietetic Intern ?12/01/2021 4:04 PM ?

## 2021-12-01 NOTE — Progress Notes (Signed)
? ? ?Referring Physician(s): Sophronia Simas  ? ?Supervising Physician: Ruel Favors ? ?Patient Status:  [facility] - In-pt ? ?Chief Complaint: ? ?Pelvic fluid collection, s/p L TG and RLQ drain placement by Dr. Bryn Gulling on 3/26.  ? ?Subjective: ? ?Patient laying in bed, NAD.  ?Reports lower abdominal pain, pt states unclear if it is due to fluid collection or constipation. ?No chills,fever, N/V.  ? ?Allergies: ?Sulfa antibiotics ? ?Medications: ?Prior to Admission medications   ?Medication Sig Start Date End Date Taking? Authorizing Provider  ?risperiDONE (RISPERDAL) 0.5 MG tablet Take 0.5 mg by mouth in the morning.   Yes [provider]  ?risperiDONE (RISPERDAL) 1 MG tablet Take 1.5 mg by mouth at bedtime.   Yes [provider]  ?vitamin B-12 (CYANOCOBALAMIN) 500 MCG tablet Take 500 mcg by mouth daily.   Yes [provider]  ? ? ? ?Vital Signs: ?BP 136/65 (BP Location: Left Arm)   Pulse 97   Temp 97.9 ?F (36.6 ?C) (Oral)   Resp 18   SpO2 93%  ? ?Physical Exam ?Vitals reviewed.  ?Constitutional:   ?   General: She is not in acute distress. ?   Appearance: Normal appearance. She is not ill-appearing.  ?HENT:  ?   Head: Normocephalic and atraumatic.  ?Pulmonary:  ?   Effort: Pulmonary effort is normal.  ?Abdominal:  ?   Palpations: Abdomen is soft.  ?Musculoskeletal:  ?   Cervical back: Neck supple.  ?Skin: ?   General: Skin is warm and dry.  ?   Coloration: Skin is not jaundiced or pale.  ?   Comments: Positive RLQ drain to suction bulb. Dressing is clean, dry, and intact. 10 ml of  clear yellow colored fluid noted in the bulb. Drain aspirates and flushes well.  ? ?Positive left TG drain to suction bulb. Dressing is clean, dry, and intact. 20 ml of  feculent colored fluid noted in the bulb. Drain aspirates and flushes well.  ? ?  ?Neurological:  ?   Mental Status: She is alert and oriented to person, place, and time.  ?Psychiatric:     ?   Mood and Affect: Mood normal.     ?   Behavior: Behavior  normal.  ? ? ?Imaging: ?CT IMAGE GUIDED DRAINAGE BY PERCUTANEOUS CATHETER ? ?Result Date: 11/28/2021 ?INDICATION: 61 year old woman with multiple diverticular abscesses presents to IR for abscess drain placement. EXAM: CT GUIDED DRAINAGE OF PELVIC ABSCESSES MEDICATIONS: The patient is currently admitted to the hospital and receiving intravenous antibiotics. The antibiotics were administered within an appropriate time frame prior to the initiation of the procedure. ANESTHESIA/SEDATION: Moderate (conscious) sedation was employed during this procedure. A total of Versed 3 mg and Fentanyl 150 mcg was administered intravenously by the radiology nurse. Total intra-service moderate Sedation Time: 63 minutes. The patient's level of consciousness and vital signs were monitored continuously by radiology nursing throughout the procedure under my direct supervision. COMPLICATIONS: None immediate. PROCEDURE: Informed written consent was obtained from the patient after a thorough discussion of the procedural risks, benefits and alternatives. All questions were addressed. Maximal Sterile Barrier Technique was utilized including caps, mask, sterile gowns, sterile gloves, sterile drape, hand hygiene and skin antiseptic. A timeout was performed prior to the initiation of the procedure. Patient positioned supine on the procedure table. Anterior pelvic wall skin prepped and draped in the usual sterile fashion. Following local administration, 19 gauge Yueh needle advanced into the anterior pelvic abscess utilizing CT guidance. Yueh catheter removed over 0.035  inch Amplatz guidewire. Following serial dilation, a 12 [place] multipurpose pigtail drain inserted over the guidewire. 900 mL of purulent material aspirated from the anterior pelvic fluid collection. 40 mL sample was sent for Gram stain and culture. Drain secured to skin with suture and connected to bulb suction. Patient repositioned prone on the procedure table. The left posterior  pelvic skin prepped and draped in usual fashion. Following local lidocaine administration, 19 gauge Yueh needle was advanced into the left posterior pelvic abscess utilized CT guidance. Yueh catheter was removed over 0.035 inch Amplatz guidewire. Serial dilation was performed and 10 [place] multipurpose pigtail drain was inserted. 100 mL of purulent material was aspirated from the posterior pelvic fluid collection. Drain was secured to skin with suture and connected to bulb suction. IMPRESSION: 1. 12.0 French multipurpose pigtail drain placed and anterior pelvic abscess. 2. 10.2 French multipurpose pigtail drain placed in left posterior pelvic abscess. Electronically Signed   By: Acquanetta Belling M.D.   On: 11/28/2021 15:36  ? ?CT IMAGE GUIDED DRAINAGE BY PERCUTANEOUS CATHETER ? ?Result Date: 11/28/2021 ?INDICATION: 61 year old woman with multiple diverticular abscesses presents to IR for abscess drain placement. EXAM: CT GUIDED DRAINAGE OF PELVIC ABSCESSES MEDICATIONS: The patient is currently admitted to the hospital and receiving intravenous antibiotics. The antibiotics were administered within an appropriate time frame prior to the initiation of the procedure. ANESTHESIA/SEDATION: Moderate (conscious) sedation was employed during this procedure. A total of Versed 3 mg and Fentanyl 150 mcg was administered intravenously by the radiology nurse. Total intra-service moderate Sedation Time: 63 minutes. The patient's level of consciousness and vital signs were monitored continuously by radiology nursing throughout the procedure under my direct supervision. COMPLICATIONS: None immediate. PROCEDURE: Informed written consent was obtained from the patient after a thorough discussion of the procedural risks, benefits and alternatives. All questions were addressed. Maximal Sterile Barrier Technique was utilized including caps, mask, sterile gowns, sterile gloves, sterile drape, hand hygiene and skin antiseptic. A timeout was  performed prior to the initiation of the procedure. Patient positioned supine on the procedure table. Anterior pelvic wall skin prepped and draped in the usual sterile fashion. Following local administration, 19 gauge Yueh needle advanced into the anterior pelvic abscess utilizing CT guidance. Yueh catheter removed over 0.035 inch Amplatz guidewire. Following serial dilation, a 12 [place] multipurpose pigtail drain inserted over the guidewire. 900 mL of purulent material aspirated from the anterior pelvic fluid collection. 40 mL sample was sent for Gram stain and culture. Drain secured to skin with suture and connected to bulb suction. Patient repositioned prone on the procedure table. The left posterior pelvic skin prepped and draped in usual fashion. Following local lidocaine administration, 19 gauge Yueh needle was advanced into the left posterior pelvic abscess utilized CT guidance. Yueh catheter was removed over 0.035 inch Amplatz guidewire. Serial dilation was performed and 10 [place] multipurpose pigtail drain was inserted. 100 mL of purulent material was aspirated from the posterior pelvic fluid collection. Drain was secured to skin with suture and connected to bulb suction. IMPRESSION: 1. 12.0 French multipurpose pigtail drain placed and anterior pelvic abscess. 2. 10.2 French multipurpose pigtail drain placed in left posterior pelvic abscess. Electronically Signed   By: Acquanetta Belling M.D.   On: 11/28/2021 15:36   ? ?Labs: ? ?CBC: ?Recent Labs  ?  11/28/21 ?4650 11/29/21 ?3546 11/30/21 ?5681 12/01/21 ?0206  ?WBC 30.5* 17.6* 16.9* 18.4*  ?HGB 11.9* 10.6* 10.6* 10.5*  ?HCT 36.1 32.1* 32.1* 32.3*  ?PLT 429* 385 408* 396  ? ? ?  COAGS: ?Recent Labs  ?  11/28/21 ?1054  ?INR 1.2  ? ? ?BMP: ?Recent Labs  ?  04/01/21 ?1505 11/28/21 ?[id number] 11/29/21 ?[id number] 11/30/21 ?0050  ?NA 141 141 143 144  ?K 4.0 3.8 3.7 4.7  ?CL 102 101 103 101  ?CO2 29 31 35* 36*  ?GLUCOSE 86 93 88 72  ?BUN 16 22* 22* 18  ?CALCIUM 10.2 8.7* 8.5* 8.5*   ?CREATININE 0.66 0.53 0.46 0.53  ?GFRNONAA >60 >60 >60 >60  ? ? ?LIVER FUNCTION TESTS: ?No results for input(s): BILITOT, AST, ALT, ALKPHOS, PROT, ALBUMIN in the last 8760 hours. ? ?Assessment and Plan: ? ?4660

## 2021-12-01 NOTE — Evaluation (Signed)
Physical Therapy Evaluation ?Patient Details ?Name: Judith Miller ?MRN: [id number] ?DOB: [birth year] ?Today's Date: 12/01/2021 ? ?History of Present Illness ? 61 y.o. female presents to [facility] hospital 11/27/2021 as a transfer from [facility] with perforated sigmoid colon. Pt underwent CT guided pelvic drain placement on 3/26. PMH includes bipolar disorder.  ?Clinical Impression ? Pt presents to PT with deficits in strength, power, balance, gait, functional mobility. Pt is limited by abdominal pain although premedicated prior to session. Pt is able to ambulate for limited household distances, but requires increased time, UE support of an assistive device and one person assistance for safety. Pt will benefit from continued acute PT services in an effort to reduce falls risk and restore independence. PT encourages frequent mobilization with staff assistance to improve comfort as well as gait quality.   ?   ? ?Recommendations for follow up therapy are one component of a multi-disciplinary discharge planning process, led by the attending physician.  Recommendations may be updated based on patient status, additional functional criteria and insurance authorization. ? ?Follow Up Recommendations Home health PT ? ?  ?Assistance Recommended at Discharge Intermittent Supervision/Assistance  ?Patient can return home with the following ? [name];[name];Assist for transportation;[name];Assistance with cooking/housework ? ?  ?Equipment Recommendations Rolling walker (2 wheels)  ?Recommendations for Other Services ?    ?  ?Functional Status Assessment Patient has had a recent decline in their functional status and demonstrates the ability to make significant improvements in function in a reasonable and predictable amount of time.  ? ?  ?Precautions / Restrictions Precautions ?Precautions: Fall ?Precaution Comments: JP drain x  2 ?Restrictions ?Weight Bearing Restrictions: No  ? ?  ? ?Mobility ? Bed Mobility ?Overal bed mobility: Needs Assistance ?Bed Mobility: Rolling, Sidelying to Sit, Sit to Supine ?Rolling: Min guard ?Sidelying to sit: Min guard, HOB elevated ?  ?Sit to supine: Min assist ?  ?General bed mobility comments: cues for rolling to reduce pain ?  ? ?Transfers ?Overall transfer level: Needs assistance ?Equipment used:  (standing scale) ?Transfers: Sit to/from Stand ?Sit to Stand: Min guard ?  ?  ?  ?  ?  ?  ?  ? ?Ambulation/Gait ?Ambulation/Gait assistance: Min guard ?Gait Distance (Feet): 50 Feet ?Assistive device: IV Pole ?Gait Pattern/deviations: Step-to pattern ?Gait velocity: reduced ?Gait velocity interpretation: <1.31 ft/sec, indicative of household ambulator ?  ?General Gait Details: pt with slowed step-to gait, increased trunk flexion guarding abdomen, one lateral loss of balance ? ?Stairs ?  ?  ?  ?  ?  ? ?Wheelchair Mobility ?  ? ?Modified Rankin (Stroke Patients Only) ?  ? ?  ? ?Balance Overall balance assessment: Needs assistance ?Sitting-balance support: No upper extremity supported, Feet supported ?Sitting balance-Leahy Scale: Good ?  ?  ?Standing balance support: Single extremity supported, Reliant on assistive device for balance ?Standing balance-Leahy Scale: Poor ?Standing balance comment: minG-minA ?  ?  ?  ?  ?  ?  ?  ?  ?  ?  ?  ?   ? ? ? ?Pertinent Vitals/Pain Pain Assessment ?Pain Assessment: Faces ?Faces Pain Scale: Hurts whole lot ?Pain Location: abdomen ?Pain Descriptors / Indicators: Grimacing ?Pain Intervention(s): Premedicated before session  ? ? ?Home Living Family/patient expects to be discharged to:: Private residence ?Living Arrangements: Alone ?Available Help at Discharge: Family;Available PRN/intermittently (daughter works days full-time) ?Type of Home: House ?Home Access: Stairs to enter ?Entrance Stairs-Rails: None ?  Entrance Stairs-Number of Steps: 2 ?  ?Home Layout: One level ?Home  Equipment: None ?   ?  ?Prior Function Prior Level of Function : Independent/Modified Independent ?  ?  ?  ?  ?  ?  ?  ?  ?  ? ? ?Hand Dominance  ?   ? ?  ?Extremity/Trunk Assessment  ? Upper Extremity Assessment ?Upper Extremity Assessment: Generalized weakness ?  ? ?Lower Extremity Assessment ?Lower Extremity Assessment: Generalized weakness ?  ? ?Cervical / Trunk Assessment ?Cervical / Trunk Assessment: Kyphotic;Other exceptions (guarding abdomen)  ?Communication  ? Communication: No difficulties  ?Cognition Arousal/Alertness: Awake/alert ?Behavior During Therapy: [facility] for tasks assessed/performed ?Overall Cognitive Status: Within Functional Limits for tasks assessed ?  ?  ?  ?  ?  ?  ?  ?  ?  ?  ?  ?  ?  ?  ?  ?  ?  ?  ?  ? ?  ?General Comments General comments (skin integrity, edema, etc.): VSS on RA ? ?  ?Exercises    ? ?Assessment/Plan  ?  ?PT Assessment Patient needs continued PT services  ?PT Problem List Decreased strength;Decreased activity tolerance;Decreased balance;Decreased mobility;Pain;Decreased knowledge of use of DME ? ?   ?  ?PT Treatment Interventions DME instruction;Gait training;Stair training;Functional mobility training;Therapeutic exercise;Therapeutic activities;Balance training;Neuromuscular re-education;Patient/family education   ? ?PT Goals (Current goals can be found in the Care Plan section)  ?Acute Rehab PT Goals ?Patient Stated Goal: to return to independence and reduce pain ?PT Goal Formulation: With patient ?Time For Goal Achievement: 12/15/21 ?Potential to Achieve Goals: Good ? ?  ?Frequency Min 3X/week ?  ? ? ?Co-evaluation   ?  ?  ?  ?  ? ? ?  ?AM-PAC PT "6 Clicks" Mobility  ?Outcome Measure Help needed turning from your back to your side while in a flat bed without using bedrails?: A Little ?Help needed moving from lying on your back to sitting on the side of a flat bed without using bedrails?: A Little ?Help needed moving to and from a bed to a chair (including a wheelchair)?:  A Little ?Help needed standing up from a chair using your arms (e.g., wheelchair or bedside chair)?: A Little ?Help needed to walk in hospital room?: A Little ?Help needed climbing 3-5 steps with a railing? : Total ?6 Click Score: 16 ? ?  ?End of Session   ?Activity Tolerance: Patient limited by pain ?Patient left: in bed;with bed alarm set;with call bell/phone within reach ?Nurse Communication: Mobility status ?PT Visit Diagnosis: Other abnormalities of gait and mobility (R26.89);Muscle weakness (generalized) (M62.81);Pain ?Pain - part of body:  (abdomen) ?  ? ?Time: 2122-4825 ?PT Time Calculation (min) (ACUTE ONLY): 22 min ? ? ?Charges:   PT Evaluation ?$PT Eval Low Complexity: 1 Low ?  ?  ?   ? ? ?Arlyss Gandy, PT, DPT ?Acute Rehabilitation ?Pager: [phone] ?Office [phone] ? ? ?Arlyss Gandy ?12/01/2021, 5:30 PM ? ?

## 2021-12-01 NOTE — Progress Notes (Addendum)
[facility] ?Progress Note ? ?   ?Subjective: ?CC:  ?NAEO. Not moving much. +flatus. Had two small BMs. Denies nausea or vomiting with NG clamped. Nervous about removal of NG because it was hard and uncomfortable to place.  ? ?Objective: ?Vital signs in last 24 hours: ?Temp:  [97.9 ?F (36.6 ?C)-98.7 ?F (37.1 ?C)] 97.9 ?F (36.6 ?C) (03/29 0408) ?Pulse Rate:  [80-97] 97 (03/29 0408) ?Resp:  [18] 18 (03/29 0408) ?BP: (134-141)/(64-67) 136/65 (03/29 0408) ?SpO2:  [91 %-93 %] 93 % (03/29 0408) ?Last BM Date : 11/30/21 ? ?Intake/Output from previous day: ?03/28 0701 - 03/29 0700 ?In: 6152.4 [I.V.:5939.7; IV Piggyback:212.7] ?Out: 1080 [Urine:1000; Drains:80] ?Intake/Output this shift: ?No intake/output data recorded. ? ?PE: ?Gen:  Alert, NAD, pleasant ?Card:  Regular rate and rhythm, pedal pulses 2+ BL ?Pulm:  Normal effort, clear to auscultation bilaterally ?Abd: Soft, appropriately tender, NG in place, clamped. L transgluteal drain with purulent/feculent material, abdominal wall JP with some purulence.  ?Skin: warm and dry, no rashes  ?Psych: A&Ox3  ? ?Lab Results:  ?Recent Labs  ?  11/30/21 ?0630 12/01/21 ?0206  ?WBC 16.9* 18.4*  ?HGB 10.6* 10.5*  ?HCT 32.1* 32.3*  ?PLT 408* 396  ? ?BMET ?Recent Labs  ?  11/29/21 ?1601 11/30/21 ?0050  ?NA 143 144  ?K 3.7 4.7  ?CL 103 101  ?CO2 35* 36*  ?GLUCOSE 88 72  ?BUN 22* 18  ?CREATININE 0.46 0.53  ?CALCIUM 8.5* 8.5*  ? ?PT/INR ?Recent Labs  ?  11/28/21 ?1054  ?LABPROT 14.8  ?INR 1.2  ? ?CMP  ?   ?Component Value Date/Time  ? NA 144 11/30/2021 0050  ? K 4.7 11/30/2021 0050  ? CL 101 11/30/2021 0050  ? CO2 36 (H) 11/30/2021 0050  ? GLUCOSE 72 11/30/2021 0050  ? BUN 18 11/30/2021 0050  ? CREATININE 0.53 11/30/2021 0050  ? CALCIUM 8.5 (L) 11/30/2021 0050  ? GFRNONAA >60 11/30/2021 0050  ? GFRAA  02/27/2010 2026  ?  >60        ?The eGFR has been calculated ?using the MDRD equation. ?This calculation has not been ?validated in all clinical ?situations. ?eGFR's  persistently ?<60 mL/min signify ?possible Chronic Kidney Disease.  ? ?Lipase  ?No results found for: LIPASE ? ? ? ? ?Studies/Results: ?No results found. ? ?Anti-infectives: ?Anti-infectives (From admission, onward)  ? ? Start     Dose/Rate Route Frequency Ordered Stop  ? 11/27/21 2300  piperacillin-tazobactam (ZOSYN) IVPB 3.375 g       ? 3.375 g ?12.5 mL/hr over 240 Minutes Intravenous Every 8 hours 11/27/21 2202 12/04/21 2159  ? ?  ? ? ? ?Assessment/Plan ?Sigmoid diverticulitis with pelvic abscesses ?S/P IR drainage x 2 3/26 ?- afebrile, VSS, WBC 18.4 from 17, monitor ?- ileus slowing improving - pt having flatus, two small BMs, decreased NG output.  NG clamped, pt nervous about removal. Allow CLD. Remove NGT at 12 PM if tolerating clears. ?- monitor JP cultures, Cx w klebsiella resistant to ampicillin. ?- continue IV Zosyn ?- OOB and mobilize  ?- given increasing leukocytosis and large volume of purulence in the pelvis, suspect patient may need repeat CT scan in the next 48 hours to evaluate abscesses.  ? ?FEN: CLD, NGT to be removed this afternoon ?ID: Zosyn ?VTE: SCD's, lovenox ?Foley: none ? ?Bipolar disorder, home meds.  ? ? LOS: 4 days  ? ?I reviewed nursing notes, Consultant IR notes, last 24 h vitals and pain scores, last 48 h intake  and output, last 24 h labs and trends, and last 24 h imaging results. CBC, BMP, CT abd  ? ?Obie Dredge, PA-C ?[facility] ?Please see Amion for pager number during day hours 7:00am-4:30pm ? ? ? ? ? ? ?

## 2021-12-02 LAB — CBC
HCT: 30.5 % — ABNORMAL LOW (ref 36.0–46.0)
Hemoglobin: 10 g/dL — ABNORMAL LOW (ref 12.0–15.0)
MCH: 28.1 pg (ref 26.0–34.0)
MCHC: 32.8 g/dL (ref 30.0–36.0)
MCV: 85.7 fL (ref 80.0–100.0)
Platelets: 442 10*3/uL — ABNORMAL HIGH (ref 150–400)
RBC: 3.56 MIL/uL — ABNORMAL LOW (ref 3.87–5.11)
RDW: 14.2 % (ref 11.5–15.5)
WBC: 14.9 10*3/uL — ABNORMAL HIGH (ref 4.0–10.5)
nRBC: 0 % (ref 0.0–0.2)

## 2021-12-02 LAB — AEROBIC/ANAEROBIC CULTURE W GRAM STAIN (SURGICAL/DEEP WOUND): Gram Stain: NONE SEEN

## 2021-12-02 MED ORDER — SODIUM CHLORIDE 0.9% FLUSH
5.0000 mL | Freq: Three times a day (TID) | INTRAVENOUS | Status: DC
  Administered 2021-12-02 – 2021-12-15 (×37): 5 mL

## 2021-12-02 NOTE — TOC Initial Note (Addendum)
Transition of Care (TOC) - Initial/Assessment Note  ? ? ?Patient Details  ?Name: Judith Miller ?MRN: [id number] ?Date of Birth: [birth year] ? ?Transition of Care (TOC) CM/SW Contact:    ?Kingsley Plan, RN ?Phone Number: ?12/02/2021, 1:11 PM ? ?Clinical Narrative:                 ?Spoke to patient at bedside. Confirmed face sheet information. Patient active with [facility] Department.  ? ?Patient does not have insurance but has submitted for Medicaid.  ? ?Patient will need to be taught drain care by nurse prior to discharge.  ? ?PT recommending HHPT. Iantha Fallen is the agency this week assisting patients with no insurance. Amy with [facility] reviewing referral await determination.  ? ? ?Called for walker with [facility]  ? ?1518 Amy with [facility] returned call, they do not have the staffing to accept home health referral.  ?Patient receiving personnel care.  ? ?Norris Cross PA aware and patient aware  ?Expected Discharge Plan: Home w Home Health Services ?Barriers to Discharge: Continued Medical Work up ? ? ?Patient Goals and CMS Choice ?Patient states their goals for this hospitalization and ongoing recovery are:: to return home ?CMS Medicare.gov Compare Post Acute Care list provided to:: Patient ?Choice offered to / list presented to : Patient ? ?Expected Discharge Plan and Services ?Expected Discharge Plan: Home w Home Health Services ?  ?Discharge Planning Services: CM Consult ?Post Acute Care Choice: Home Health ?Living arrangements for the past 2 months: Single Family Home ?                ?DME Arranged: Walker rolling ?DME Agency: [facility] ?  ?  ?  ?HH Arranged: PT ?HH Agency: [facility] ?Date HH Agency Contacted: 12/02/21 ?Time HH Agency Contacted: 1310 ?Representative spoke with at [facility] Agency: Amy ? ?Prior Living Arrangements/Services ?Living arrangements for the past 2 months: Single Family Home ?Lives with:: Self ?Patient language and need for interpreter reviewed:: Yes ?Do you feel  safe going back to the place where you live?: Yes      ?Need for Family Participation in Patient Care: Yes (Comment) ?Care giver support system in place?: Yes (comment) ?  ?Criminal Activity/Legal Involvement Pertinent to Current Situation/Hospitalization: No - Comment as needed ? ?Activities of Daily Living ?Home Assistive Devices/Equipment: Eyeglasses ?ADL Screening (condition at time of admission) ?Patient's cognitive ability adequate to safely complete daily activities?: Yes ?Is the patient deaf or have difficulty hearing?: No ?Does the patient have difficulty seeing, even when wearing glasses/contacts?: No ?Does the patient have difficulty concentrating, remembering, or making decisions?: No ?Patient able to express need for assistance with ADLs?: Yes ?Does the patient have difficulty dressing or bathing?: No ?Independently performs ADLs?: Yes (appropriate for developmental age) ?Does the patient have difficulty walking or climbing stairs?: No ?Weakness of Legs: None ?Weakness of Arms/Hands: None ? ?Permission Sought/Granted ?  ?Permission granted to share information with : No ?   ?   ?   ?   ? ?Emotional Assessment ?Appearance:: Appears older than stated age ?Attitude/Demeanor/Rapport: Engaged ?Affect (typically observed): Accepting ?Orientation: : Oriented to Self, Oriented to Place, Oriented to  Time, Oriented to Situation ?Alcohol / Substance Use: Not Applicable ?Psych Involvement: No (comment) ? ?Admission diagnosis:  Perforation of sigmoid colon (HCC) [K63.1] ?Patient Active Problem List  ? Diagnosis Date Noted  ? Perforation of sigmoid colon (HCC) 11/27/2021  ? ?PCP:  [facility] ?Pharmacy:   ?[facility] [phone] -  [place], [place] - [address] ?[address] ?[place] [address] ?Phone: [phone] Fax: [phone] ? ?[facility] ?[address] ?[place] [address] ?Phone: [phone] Fax: [phone] ? ? ? ? ?Social Determinants of Health (SDOH)  Interventions ?  ? ?Readmission Risk Interventions ?   ? View : No data to display.  ?  ?  ?  ? ? ? ?

## 2021-12-02 NOTE — Progress Notes (Signed)
Mobility Specialist: Progress Note ? ? 12/02/21 1301  ?Mobility  ?Activity Ambulated with assistance in room  ?Level of Assistance Minimal assist, patient does 75% or more  ?Assistive Device Other (Comment) ?(IV pole)  ?Distance Ambulated (ft) 144 ft  ?Activity Response Tolerated well  ?$Mobility charge 1 Mobility  ? ?Pt received in bed and agreeable to ambulation. To BR and then agreeable to in room mobility, minA to sit EOB. Walked x3 laps in the room and then back to bed per request. Pt has call bell and phone at her side.  ? ?Judith Miller ?Mobility Specialist ?Mobility Specialist 5 North: [phone] ?Mobility Specialist 6 North: [phone] ? ?

## 2021-12-02 NOTE — Progress Notes (Signed)
[facility] ?Progress Note ? ?   ?Subjective: ?CC:  ?Still having pain and taking dilaudid about as frequently as she can get it.  TG drain 25cc, RLQ 10cc.  Tolerating her CLD with no increase in pain.  Ambulating hurts so she isn't doing it much. 2BMs ? ?Objective: ?Vital signs in last 24 hours: ?Temp:  [97.6 ?F (36.4 ?C)-98.6 ?F (37 ?C)] 97.6 ?F (36.4 ?C) (03/30 3557) ?Pulse Rate:  [76-86] 76 (03/30 0851) ?Resp:  [18] 18 (03/30 3220) ?BP: (112-135)/(57-65) 112/59 (03/30 2542) ?SpO2:  [90 %-92 %] 92 % (03/30 0851) ?Weight:  [82.6 kg] 82.6 kg (03/29 1719) ?Last BM Date : 11/30/21 ? ?Intake/Output from previous day: ?03/29 0701 - 03/30 0700 ?In: 4595.9 [P.O.:2040; I.V.:2395.9; IV Piggyback:150] ?Out: 276 [Urine:204; Drains:70; Stool:2] ?Intake/Output this shift: ?Total I/O ?In: 341.7 [P.O.:120; I.V.:221.7] ?Out: 35 [Drains:35] ? ?PE: ?Gen:  Alert, NAD, pleasant ?Card:  Regular rate and rhythm, pedal pulses 2+ BL ?Pulm:  Normal effort, clear to auscultation bilaterally ?Abd: Soft, but seems distended, +BS, TG drain with feculent output, but not much.  RLQ drain with serous output. ? ?Lab Results:  ?Recent Labs  ?  12/01/21 ?0206 12/02/21 ?7062  ?WBC 18.4* 14.9*  ?HGB 10.5* 10.0*  ?HCT 32.3* 30.5*  ?PLT 396 442*  ? ?BMET ?Recent Labs  ?  11/30/21 ?0050  ?NA 144  ?K 4.7  ?CL 101  ?CO2 36*  ?GLUCOSE 72  ?BUN 18  ?CREATININE 0.53  ?CALCIUM 8.5*  ? ?PT/INR ?No results for input(s): LABPROT, INR in the last 72 hours. ? ?CMP  ?   ?Component Value Date/Time  ? NA 144 11/30/2021 0050  ? K 4.7 11/30/2021 0050  ? CL 101 11/30/2021 0050  ? CO2 36 (H) 11/30/2021 0050  ? GLUCOSE 72 11/30/2021 0050  ? BUN 18 11/30/2021 0050  ? CREATININE 0.53 11/30/2021 0050  ? CALCIUM 8.5 (L) 11/30/2021 0050  ? GFRNONAA >60 11/30/2021 0050  ? GFRAA  02/27/2010 2026  ?  >60        ?The eGFR has been calculated ?using the MDRD equation. ?This calculation has not been ?validated in all clinical ?situations. ?eGFR's persistently ?<60  mL/min signify ?possible Chronic Kidney Disease.  ? ?Lipase  ?No results found for: LIPASE ? ? ? ? ?Studies/Results: ?No results found. ? ?Anti-infectives: ?Anti-infectives (From admission, onward)  ? ? Start     Dose/Rate Route Frequency Ordered Stop  ? 11/27/21 2300  piperacillin-tazobactam (ZOSYN) IVPB 3.375 g       ? 3.375 g ?12.5 mL/hr over 240 Minutes Intravenous Every 8 hours 11/27/21 2202 12/04/21 2159  ? ?  ? ? ? ?Assessment/Plan ?Sigmoid diverticulitis with pelvic abscesses ?S/P IR drainage x 2 3/26 ?- afebrile, VSS, WBC 18.4 from 17, monitor ?-on CLD and tolerating but still having a fair amount of pain requiring dilaudid very frequently. ?- monitor JP cultures, Cx w klebsiella resistant to ampicillin. ?- continue IV Zosyn ?- OOB and mobilize  ?- given increasing leukocytosis and large volume of purulence in the pelvis, suspect patient may need repeat CT scan in the next 48 hours to evaluate abscesses.  ? ?FEN: CLD/IVF ?ID: Zosyn ?VTE: SCD's, lovenox ?Foley: none ? ?Bipolar disorder, home meds.  ? ? LOS: 5 days  ? ?I reviewed nursing notes, Consultant IR notes, last 24 h vitals and pain scores, last 48 h intake and output, last 24 h labs and trends, and last 24 h imaging results.  ? ?Henreitta Cea, PA-C ?[facility] ?Please see Amion for pager number during day hours 7:00am-4:30pm ? ? ? ? ? ? ?

## 2021-12-03 LAB — BASIC METABOLIC PANEL
Anion gap: 7 (ref 5–15)
BUN: <5 mg/dL — ABNORMAL LOW (ref 6–20)
CO2: 33 mmol/L — ABNORMAL HIGH (ref 22–32)
Calcium: 8.1 mg/dL — ABNORMAL LOW (ref 8.9–10.3)
Chloride: 95 mmol/L — ABNORMAL LOW (ref 98–111)
Creatinine, Ser: 0.45 mg/dL (ref 0.44–1.00)
GFR, Estimated: >60 mL/min (ref >60)
Glucose, Bld: 96 mg/dL (ref 70–99)
Potassium: 3.3 mmol/L — ABNORMAL LOW (ref 3.5–5.1)
Sodium: 135 mmol/L (ref 135–145)

## 2021-12-03 LAB — CBC
HCT: 29.3 % — ABNORMAL LOW (ref 36.0–46.0)
Hemoglobin: 9.6 g/dL — ABNORMAL LOW (ref 12.0–15.0)
MCH: 28.3 pg (ref 26.0–34.0)
MCHC: 32.8 g/dL (ref 30.0–36.0)
MCV: 86.4 fL (ref 80.0–100.0)
Platelets: 492 10*3/uL — ABNORMAL HIGH (ref 150–400)
RBC: 3.39 MIL/uL — ABNORMAL LOW (ref 3.87–5.11)
RDW: 14.2 % (ref 11.5–15.5)
WBC: 14.5 10*3/uL — ABNORMAL HIGH (ref 4.0–10.5)
nRBC: 0 % (ref 0.0–0.2)

## 2021-12-03 MED ORDER — ACETAMINOPHEN 500 MG PO TABS
1000.0000 mg | ORAL_TABLET | Freq: Four times a day (QID) | ORAL | Status: DC
  Administered 2021-12-03 – 2021-12-15 (×44): 1000 mg via ORAL
  Filled 2021-12-03 (×47): qty 2

## 2021-12-03 MED ORDER — POTASSIUM CHLORIDE CRYS ER 20 MEQ PO TBCR
30.0000 meq | EXTENDED_RELEASE_TABLET | Freq: Two times a day (BID) | ORAL | Status: AC
  Administered 2021-12-03 (×2): 30 meq via ORAL
  Filled 2021-12-03 (×2): qty 1

## 2021-12-03 MED ORDER — HYDROMORPHONE HCL 1 MG/ML IJ SOLN
0.5000 mg | INTRAMUSCULAR | Status: DC | PRN
  Administered 2021-12-05 – 2021-12-07 (×7): 1 mg via INTRAVENOUS
  Administered 2021-12-12 (×2): 0.5 mg via INTRAVENOUS
  Administered 2021-12-12 – 2021-12-15 (×10): 1 mg via INTRAVENOUS
  Filled 2021-12-03 (×18): qty 1

## 2021-12-03 MED ORDER — OXYCODONE HCL 5 MG PO TABS
5.0000 mg | ORAL_TABLET | ORAL | Status: DC | PRN
  Administered 2021-12-03 – 2021-12-04 (×4): 10 mg via ORAL
  Administered 2021-12-04: 5 mg via ORAL
  Administered 2021-12-04 – 2021-12-07 (×10): 10 mg via ORAL
  Filled 2021-12-03 (×9): qty 2
  Filled 2021-12-03: qty 1
  Filled 2021-12-03 (×5): qty 2

## 2021-12-03 NOTE — Progress Notes (Signed)
Mobility Specialist Progress Note: ? ? 12/03/21 1533  ?Mobility  ?Activity Ambulated with assistance in room  ?Level of Assistance Standby assist, set-up cues, supervision of patient - no hands on  ?Assistive Device  ?(IV pole)  ?Distance Ambulated (ft) 60 ft  ?Activity Response Tolerated fair  ?$Mobility charge 1 Mobility  ? ?Pt received in bed willing to participate in mobility. Complaints of 7/10 lower abdomin pain. Left in bed with call bell in reach and all needs met.  ? ?Judith Miller ?Mobility Specialist ?Primary Phone [phone] ? ?

## 2021-12-03 NOTE — Progress Notes (Signed)
? ? ?Referring Physician(s): Sophronia Simas  ? ?Supervising Physician: Irish Lack ? ?Patient Status:  [facility] - In-pt ? ?Chief Complaint: ?Pelvic fluid collection, s/p L TG and RLQ drain placement by Dr. Bryn Gulling on 3/26.  ? ?Subjective: ?Patient laying in bed, NAD. Asking how much longer she will have to be admitted. ?Still reporting abdominal pain. ?Drains intact.  ? ?Allergies: ?Sulfa antibiotics ? ?Medications: ?Prior to Admission medications   ?Medication Sig Start Date End Date Taking? Authorizing Provider  ?risperiDONE (RISPERDAL) 0.5 MG tablet Take 0.5 mg by mouth in the morning.   Yes [provider]  ?risperiDONE (RISPERDAL) 1 MG tablet Take 1.5 mg by mouth at bedtime.   Yes [provider]  ?vitamin B-12 (CYANOCOBALAMIN) 500 MCG tablet Take 500 mcg by mouth daily.   Yes [provider]  ? ? ? ?Vital Signs: ?BP (!) 106/52 (BP Location: Left Arm)   Pulse 79   Temp 99.4 ?F (37.4 ?C) (Oral)   Resp 17   Ht 5\' 4"  (1.626 m)   Wt 182 lb (82.6 kg)   SpO2 (!) 83%   BMI 31.24 kg/m?  ? ?Physical Exam ?Vitals and nursing note reviewed.  ?NAD, alert, resting comfortably ?Abdomen: RLQ drain to suction bulb. Dressing is clean, dry, and intact. 10 ml of cloudy, purulent-containing fluid in bulb. L TG drain to suction bulb with foul-smelling, feculent output.  ? ?Imaging: ?No results found. ? ?Labs: ? ?CBC: ?Recent Labs  ?  11/30/21 ?12/02/21 12/01/21 ?0206 12/02/21 ?12/04/21 12/03/21 ?0119  ?WBC 16.9* 18.4* 14.9* 14.5*  ?HGB 10.6* 10.5* 10.0* 9.6*  ?HCT 32.1* 32.3* 30.5* 29.3*  ?PLT 408* 396 442* 492*  ? ? ? ?COAGS: ?Recent Labs  ?  11/28/21 ?1054  ?INR 1.2  ? ? ? ?BMP: ?Recent Labs  ?  11/28/21 ?11/30/21 11/29/21 ?12/01/21 11/30/21 ?12/02/21 12/03/21 ?0119  ?NA 141 143 144 135  ?K 3.8 3.7 4.7 3.3*  ?CL 101 103 101 95*  ?CO2 31 35* 36* 33*  ?GLUCOSE 93 88 72 96  ?BUN 22* 22* 18 <5*  ?CALCIUM 8.7* 8.5* 8.5* 8.1*  ?CREATININE 0.53 0.46 0.53 0.45  ?GFRNONAA >60 >60 >60 >60  ? ? ? ?LIVER FUNCTION TESTS: ?No results  for input(s): BILITOT, AST, ALT, ALKPHOS, PROT, ALBUMIN in the last 8760 hours. ? ?Assessment and Plan: ?Pelvic fluid collection, s/p L TG and RLQ drain placement by Dr. 12/05/21 on 3/26.  ?With ongoing abdominal pain.  ?WBC 14.5 today.  ? ?Drain Location: RLQ anterior pelvic ?Size: Fr size: 12 Fr ?Date of placement: 3/26 ?Currently to: suction bulb ?  ?Drain Location: left TG ?Size: Fr size: 10 Fr ?Date of placement: 3/26 ?Currently to: bulb ? ? ?24 hour output:  ?Output by Drain (mL) 12/01/21 0701 - 12/01/21 1900 12/01/21 1901 - 12/02/21 0700 12/02/21 0701 - 12/02/21 1900 12/02/21 1901 - 12/03/21 0700 12/03/21 0701 - 12/03/21 1337  ?Closed System Drain 1 Anterior;Inferior Abdomen Bulb (JP) 12 Fr.  20 30 40   ?Closed System Drain 2 Left Buttock Bulb (JP) 10 Fr.  50 55 30 30  ? ? ?Interval imaging/drain manipulation:  ?None  ? ?Current examination: ?Flushes/aspirates easily.  ?Insertion site unremarkable. ?Suture and stat lock in place. ?Dressed appropriately.  ? ?Plan: ?Continue TID flushes with 5 cc NS. ?Record output Q shift. ?Dressing changes QD or PRN if soiled.  ?Call IR APP or on call IR MD if difficulty flushing or sudden change in drain output.  ?Repeat imaging/possible drain injection once output <  10 mL/QD (excluding flush material.) ? ?Surgical team considering repeat imaging in 48-72 hrs.  ? ?Discharge planning: ?Please contact IR APP or on call IR MD prior to patient d/c to ensure appropriate follow up plans are in place. Typically patient will follow up with IR clinic 10-14 days post d/c for repeat imaging/possible drain injection. IR scheduler will contact patient with date/time of appointment. Patient will need to flush drain QD with 5 cc NS, record output QD, dressing changes every 2-3 days or earlier if soiled.  ? ?IR will continue to follow - please call with questions or concerns. ? ? ? ?Electronically Signed: ?Hoyt Koch, PA ?12/03/2021, 1:37 PM ? ? ?I spent a total of 15 Minutes at the  the patient's bedside AND on the patient's hospital floor or unit, greater than 50% of which was counseling/coordinating care for pelvic fluid collections ? ?

## 2021-12-03 NOTE — Progress Notes (Signed)
[facility] ?Progress Note ? ?   ?Subjective: ?States her pain is about the same as yesterday.  Had another BM.  States she got up some yesterday.  No nausea, vomiting. ? ?Objective: ?Vital signs in last 24 hours: ?Temp:  [97.6 ?F (36.4 ?C)-99.4 ?F (37.4 ?C)] 99.4 ?F (37.4 ?C) (03/31 8127) ?Pulse Rate:  [76-85] 79 (03/31 5170) ?Resp:  [17-18] 17 (03/31 0174) ?BP: (106-128)/(52-62) 106/52 (03/31 9449) ?SpO2:  [83 %-92 %] 83 % (03/31 0822) ?Weight:  [82.6 kg] 82.6 kg (03/30 1049) ?Last BM Date : 11/30/21 ? ?Intake/Output from previous day: ?03/30 0701 - 03/31 0700 ?In: 3985.5 [P.O.:1660; I.V.:2144.6; IV Piggyback:160.9] ?Out: 757 [Urine:602; Drains:155] ?Intake/Output this shift: ?Total I/O ?In: -  ?Out: 30 [Drains:30] ? ?PE: ?Gen:  Alert, NAD, pleasant ?Abd: Soft, mild distended, +BS, TG drain with feculent output, 85cc yesterday.  RLQ drain with serous output, 70cc yesterday. ? ?Lab Results:  ?Recent Labs  ?  12/02/21 ?0756 12/03/21 ?0119  ?WBC 14.9* 14.5*  ?HGB 10.0* 9.6*  ?HCT 30.5* 29.3*  ?PLT 442* 492*  ? ?BMET ?Recent Labs  ?  12/03/21 ?0119  ?NA 135  ?K 3.3*  ?CL 95*  ?CO2 33*  ?GLUCOSE 96  ?BUN <5*  ?CREATININE 0.45  ?CALCIUM 8.1*  ? ?PT/INR ?No results for input(s): LABPROT, INR in the last 72 hours. ? ?CMP  ?   ?Component Value Date/Time  ? NA 135 12/03/2021 0119  ? K 3.3 (L) 12/03/2021 0119  ? CL 95 (L) 12/03/2021 0119  ? CO2 33 (H) 12/03/2021 0119  ? GLUCOSE 96 12/03/2021 0119  ? BUN <5 (L) 12/03/2021 0119  ? CREATININE 0.45 12/03/2021 0119  ? CALCIUM 8.1 (L) 12/03/2021 0119  ? GFRNONAA >60 12/03/2021 0119  ? GFRAA  02/27/2010 2026  ?  >60        ?The eGFR has been calculated ?using the MDRD equation. ?This calculation has not been ?validated in all clinical ?situations. ?eGFR's persistently ?<60 mL/min signify ?possible Chronic Kidney Disease.  ? ?Lipase  ?No results found for: LIPASE ? ? ? ? ?Studies/Results: ?No results found. ? ?Anti-infectives: ?Anti-infectives (From admission, onward)   ? ? Start     Dose/Rate Route Frequency Ordered Stop  ? 11/27/21 2300  piperacillin-tazobactam (ZOSYN) IVPB 3.375 g       ? 3.375 g ?12.5 mL/hr over 240 Minutes Intravenous Every 8 hours 11/27/21 2202 12/04/21 2159  ? ?  ? ? ? ?Assessment/Plan ?Sigmoid diverticulitis with pelvic abscesses ?S/P IR drainage x 2 3/26.  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 ?- continue IV Zosyn ?- OOB and mobilize  ?- continue to closely monitor.  Likely needs repeat CT scan later this weekend, early next week ? ?FEN: FLD/IVF ?ID: Zosyn ?VTE: SCD's, lovenox ?Foley: none ? ?Bipolar disorder, home meds.  ? ? LOS: 6 days  ? ?I reviewed nursing notes, Consultant IR notes, last 24 h vitals and pain scores, last 48 h intake and output, last 24 h labs and trends, and last 24 h imaging results.  ? ?Henreitta Cea, PA-C ?[facility] ?Please see Amion for pager number during day hours 7:00am-4:30pm ? ? ? ? ? ? ?

## 2021-12-03 NOTE — Plan of Care (Signed)

## 2021-12-03 NOTE — Progress Notes (Signed)
Physical Therapy Treatment ?Patient Details ?Name: Judith Miller ?MRN: [id number] ?DOB: 24-Jun-1961 ?Today's Date: 12/03/2021 ? ? ?History of Present Illness 61 y.o. female presents to [facility] hospital 11/27/2021 as a transfer from [facility] with perforated sigmoid colon. Pt underwent CT guided pelvic drain placement on 3/26. PMH includes bipolar disorder. ? ?  ?PT Comments  ? ? Pt received supine and agreeable to session. Pt requiring gross min guard assist throughout session for transfers and ambulation. Pt steadying self with IV pole during ambulation. Discussed use or RW at d/c, pt with one trial of 20' with no AD with fair balance with some drifting R/L noted but no LOB. Pt agreed RW use at d/c for home and community was wise. Pt instructed on IS use and demonstrated understanding with use x6 up to 1250 mL, encouraged use x5/hr, pt verbalized understanding. Educated pt re; importance of continued mobility through weekend and options for mobilization (MT, NT, RN) pt verbalized understanding. Pt continues to benefit from skilled PT services to progress toward functional mobility goals.  ?  ?Recommendations for follow up therapy are one component of a multi-disciplinary discharge planning process, led by the attending physician.  Recommendations may be updated based on patient status, additional functional criteria and insurance authorization. ? ?Follow Up Recommendations ? Home health PT ?  ?  ?Assistance Recommended at Discharge Intermittent Supervision/Assistance  ?Patient can return home with the following [name];[name];Assist for transportation;[name];Assistance with cooking/housework ?  ?Equipment Recommendations ? Rolling walker (2 wheels)  ?  ?Recommendations for Other Services   ? ? ?  ?Precautions / Restrictions Precautions ?Precautions: Fall ?Precaution Comments: JP drain x 2 ?Restrictions ?Weight  Bearing Restrictions: No  ?  ? ?Mobility ? Bed Mobility ?Overal bed mobility: Needs Assistance ?Bed Mobility: Rolling, Sidelying to Sit, Sit to Supine ?Rolling: Min guard ?Sidelying to sit: Min guard, HOB elevated ?  ?Sit to supine: Min assist ?  ?General bed mobility comments: cues for rolling to reduce pain, min assist for LE back into bed. ?  ? ?Transfers ?Overall transfer level: Needs assistance ?Equipment used: None ?Transfers: Sit to/from Stand ?Sit to Stand: Min guard ?  ?  ?  ?  ?  ?  ?  ? ?Ambulation/Gait ?Ambulation/Gait assistance: Min guard ?Gait Distance (Feet): 200 Feet ?Assistive device: IV Pole ?Gait Pattern/deviations: Step-to pattern ?Gait velocity: reduced ?  ?  ?General Gait Details: slow steady gait in room with IV pole for steadying, 20' tiral without IV pole to observe balance and gait, fair balance with no AD. pt agreed RW use for home and community at d/c is wise. Max encouragement for ambulation in hall but pt declining for fear of her appearance.  ? ? ?Stairs ?  ?  ?  ?  ?  ? ? ?Wheelchair Mobility ?  ? ?Modified Rankin (Stroke Patients Only) ?  ? ? ?  ?Balance Overall balance assessment: Needs assistance ?Sitting-balance support: No upper extremity supported, Feet supported ?Sitting balance-Leahy Scale: Good ?  ?  ?Standing balance support: Single extremity supported, Reliant on assistive device for balance ?Standing balance-Leahy Scale: Poor ?Standing balance comment: minG-minA ?  ?  ?  ?  ?  ?  ?  ?  ?  ?  ?  ?  ? ?  ?Cognition Arousal/Alertness: Awake/alert ?Behavior During Therapy: [facility] for tasks assessed/performed ?Overall Cognitive Status: Within Functional Limits for tasks assessed ?  ?  ?  ?  ?  ?  ?  ?  ?  ?  ?  ?  ?  ?  ?  ?  ?  ?  ?  ? ?  ?  Exercises Other Exercises ?Other Exercises: IS instruction and use x6 ? ?  ?General Comments General comments (skin integrity, edema, etc.): VSS on RA ?  ?  ? ?Pertinent Vitals/Pain Pain Assessment ?Pain Assessment: Faces ?Faces Pain Scale:  Hurts little more ?Pain Location: abdomen, buttocks ?Pain Descriptors / Indicators: Grimacing, Aching ?Pain Intervention(s): Limited activity within patient's tolerance, Monitored during session, Repositioned, Premedicated before session  ? ? ?Home Living   ?  ?  ?  ?  ?  ?  ?  ?  ?  ?   ?  ?Prior Function    ?  ?  ?   ? ?PT Goals (current goals can now be found in the care plan section) Acute Rehab PT Goals ?Patient Stated Goal: to return to independence and reduce pain ?PT Goal Formulation: With patient ?Time For Goal Achievement: 12/15/21 ? ?  ?Frequency ? ? ? Min 3X/week ? ? ? ?  ?PT Plan    ? ? ?Co-evaluation   ?  ?  ?  ?  ? ?  ?AM-PAC PT "6 Clicks" Mobility   ?Outcome Measure ? Help needed turning from your back to your side while in a flat bed without using bedrails?: A Little ?Help needed moving from lying on your back to sitting on the side of a flat bed without using bedrails?: A Little ?Help needed moving to and from a bed to a chair (including a wheelchair)?: A Little ?Help needed standing up from a chair using your arms (e.g., wheelchair or bedside chair)?: A Little ?Help needed to walk in hospital room?: A Little ?Help needed climbing 3-5 steps with a railing? : Total ?6 Click Score: 16 ? ?  ?End of Session   ?Activity Tolerance: Patient tolerated treatment well ?Patient left: in bed;with call bell/phone within reach ?Nurse Communication: Mobility status ?PT Visit Diagnosis: Other abnormalities of gait and mobility (R26.89);Muscle weakness (generalized) (M62.81);Pain ?Pain - part of body:  (abdomen) ?  ? ? ?Time: 4098-1191 ?PT Time Calculation (min) (ACUTE ONLY): 19 min ? ?Charges:  $Gait Training: 8-22 mins          ?          ? ?Lenora Boys. PTA ?Acute Rehabilitation Services ?Office: [phone] ? ? ? ?Marlana Salvage Caron Tardif ?12/03/2021, 1:16 PM ? ?

## 2021-12-04 LAB — CBC
HCT: 27.8 % — ABNORMAL LOW (ref 36.0–46.0)
Hemoglobin: 9.4 g/dL — ABNORMAL LOW (ref 12.0–15.0)
MCH: 29.3 pg (ref 26.0–34.0)
MCHC: 33.8 g/dL (ref 30.0–36.0)
MCV: 86.6 fL (ref 80.0–100.0)
Platelets: 462 10*3/uL — ABNORMAL HIGH (ref 150–400)
RBC: 3.21 MIL/uL — ABNORMAL LOW (ref 3.87–5.11)
RDW: 15.1 % (ref 11.5–15.5)
WBC: 15.2 10*3/uL — ABNORMAL HIGH (ref 4.0–10.5)
nRBC: 0 % (ref 0.0–0.2)

## 2021-12-04 LAB — BASIC METABOLIC PANEL
Anion gap: 6 (ref 5–15)
BUN: 5 mg/dL — ABNORMAL LOW (ref 6–20)
CO2: 30 mmol/L (ref 22–32)
Calcium: 8.2 mg/dL — ABNORMAL LOW (ref 8.9–10.3)
Chloride: 101 mmol/L (ref 98–111)
Creatinine, Ser: 0.37 mg/dL — ABNORMAL LOW (ref 0.44–1.00)
GFR, Estimated: >60 mL/min (ref >60)
Glucose, Bld: 87 mg/dL (ref 70–99)
Potassium: 3.5 mmol/L (ref 3.5–5.1)
Sodium: 137 mmol/L (ref 135–145)

## 2021-12-04 MED ORDER — POTASSIUM CHLORIDE CRYS ER 20 MEQ PO TBCR
40.0000 meq | EXTENDED_RELEASE_TABLET | Freq: Two times a day (BID) | ORAL | Status: AC
  Administered 2021-12-04 (×2): 40 meq via ORAL
  Filled 2021-12-04 (×2): qty 2

## 2021-12-04 NOTE — Progress Notes (Signed)
? ?  Subjective/Chief Complaint: ?PT con't with some pain ?Tol fulls ?Mobilize ? ? ? ?Objective: ?Vital signs in last 24 hours: ?Temp:  [98.2 ?F (36.8 ?C)-99.4 ?F (37.4 ?C)] 98.3 ?F (36.8 ?C) (04/01 0755) ?Pulse Rate:  [67-80] 73 (04/01 0755) ?Resp:  [17-19] 18 (04/01 0755) ?BP: (99-124)/(52-59) 107/59 (04/01 0755) ?SpO2:  [83 %-94 %] 94 % (04/01 0755) ?Last BM Date : 12/03/21 ? ?Intake/Output from previous day: ?03/31 0701 - 04/01 0700 ?In: 1080.8 [P.O.:240; I.V.:707.6; IV Piggyback:103.2] ?Out: 125 [Drains:125] ?Intake/Output this shift: ?No intake/output data recorded. ? ?PE: ? ?Constitutional: No acute distress, conversant, appears states age. ?Eyes: Anicteric sclerae, moist conjunctiva, no lid lag ?Lungs: Clear to auscultation bilaterally, normal respiratory effort ?CV: regular rate and rhythm, no murmurs, no peripheral edema, pedal pulses 2+ ?GI: Soft, no masses or hepatosplenomegaly, non-tender to palpation, Jps murky ?Skin: No rashes, palpation reveals normal turgor ?Psychiatric: appropriate judgment and insight, oriented to person, place, and time ? ? ?Lab Results:  ?Recent Labs  ?  12/03/21 ?0119 12/04/21 ?[id number]  ?WBC 14.5* 15.2*  ?HGB 9.6* 9.4*  ?HCT 29.3* 27.8*  ?PLT 492* 462*  ? ?BMET ?Recent Labs  ?  12/03/21 ?0119 12/04/21 ?[id number]  ?NA 135 137  ?K 3.3* 3.5  ?CL 95* 101  ?CO2 33* 30  ?GLUCOSE 96 87  ?BUN <5* 5*  ?CREATININE 0.45 0.37*  ?CALCIUM 8.1* 8.2*  ? ? ?Anti-infectives: ?Anti-infectives (From admission, onward)  ? ? Start     Dose/Rate Route Frequency Ordered Stop  ? 11/27/21 2300  piperacillin-tazobactam (ZOSYN) IVPB 3.375 g       ? 3.375 g ?12.5 mL/hr over 240 Minutes Intravenous Every 8 hours 11/27/21 2202 12/04/21 2159  ? ?  ? ? ?Assessment/Plan: ?Sigmoid diverticulitis with pelvic abscesses ?S/P IR drainage x 2 3/26.  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 ?- continue IV Zosyn ?- OOB and mobilize  ?- continue to closely monitor.  ?-Repeat CT Monday ?  ?FEN: FLD/IVF ?ID: Zosyn ?VTE: SCD's, lovenox ?Foley: none ?  ?Bipolar disorder, home meds.  ? LOS: 7 days  ? ? ?Ralene Ok ?12/04/2021 ? ?

## 2021-12-04 NOTE — Progress Notes (Signed)
Mobility Specialist Progress Note: ? ? 12/04/21 1332  ?Mobility  ?Activity Ambulated with assistance in room;Ambulated with assistance to bathroom  ?Level of Assistance Standby assist, set-up cues, supervision of patient - no hands on  ?Assistive Device None  ?Distance Ambulated (ft) 150 ft  ?Activity Response Tolerated well  ?$Mobility charge 1 Mobility  ? ?Pt received in bed willing to participate in mobility. Complaints of 8/10 pain. Left EOB with call bell in reach and all needs met.  ? ?Judith Miller ?Mobility Specialist ?Primary Phone [phone] ? ?

## 2021-12-04 NOTE — Progress Notes (Signed)
Mobility Specialist Progress Note: ? ? 12/04/21 1519  ?Mobility  ?Activity Ambulated with assistance in room;Ambulated with assistance to bathroom  ?Level of Assistance Standby assist, set-up cues, supervision of patient - no hands on  ?Assistive Device None  ?Distance Ambulated (ft) 250 ft  ?Activity Response Tolerated well  ?$Mobility charge 1 Mobility  ? ?Second walk of the Judith Miller per pt request. 8/10 pain in abdomen. Left in bed with call bell in reach and all needs met. ? ?Judith Miller ?Mobility Specialist ?Primary Phone [phone] ? ?

## 2021-12-05 ENCOUNTER — Inpatient Hospital Stay (HOSPITAL_COMMUNITY): Payer: Self-pay

## 2021-12-05 MED ORDER — IOHEXOL 300 MG/ML  SOLN
100.0000 mL | Freq: Once | INTRAMUSCULAR | Status: AC | PRN
Start: 2021-12-05 — End: 2021-12-05
  Administered 2021-12-05: 100 mL via INTRAVENOUS

## 2021-12-05 NOTE — Progress Notes (Signed)
Mobility Specialist Progress Note: ? ? 12/05/21 1207  ?Mobility  ?Activity Ambulated with assistance to bathroom;Ambulated with assistance in room  ?Level of Assistance Standby assist, set-up cues, supervision of patient - no hands on  ?Assistive Device None  ?Distance Ambulated (ft) 40 ft  ?Activity Response Tolerated well  ?$Mobility charge 1 Mobility  ? ?Pt needing to use bathroom. Left sitting EOB with call bell in reach and all needs met.  ? ?My [place] ?Mobility Specialist ?Primary Phone [phone] ? ?

## 2021-12-05 NOTE — Progress Notes (Signed)
Mobility Specialist Progress Note: ? ? 12/05/21 1100  ?Mobility  ?Activity Ambulated with assistance in room  ?Level of Assistance Standby assist, set-up cues, supervision of patient - no hands on  ?Assistive Device None  ?Distance Ambulated (ft) 150 ft  ?Activity Response Tolerated well  ?$Mobility charge 1 Mobility  ? ?Pt received in bathroom. Complaints of 8/10 pain at drain site and tailbone. Left in chair with call bell in reach and all needs met.  ? ?Judith Miller ?Mobility Specialist ?Primary Phone [phone] ? ?

## 2021-12-05 NOTE — Progress Notes (Addendum)
? ?  Subjective/Chief Complaint: ?Pt tol FLD ?Having some pain ? ? ? ?Objective: ?Vital signs in last 24 hours: ?Temp:  [97.6 ?F (36.4 ?C)-98.8 ?F (37.1 ?C)] 98.8 ?F (37.1 ?C) (04/02 0755) ?Pulse Rate:  [70-90] 70 (04/02 0755) ?Resp:  [18] 18 (04/02 0755) ?BP: (105-138)/(46-66) 118/58 (04/02 0755) ?SpO2:  [90 %-95 %] 95 % (04/02 0755) ?Last BM Date : 12/03/21 ? ?Intake/Output from previous day: ?04/01 0701 - 04/02 0700 ?In: 485 [P.O.:480] ?Out: 71 [Drains:80] ?Intake/Output this shift: ?No intake/output data recorded. ? ? ?PE: ?  ?Constitutional: No acute distress, conversant, appears states age. ?Eyes: Anicteric sclerae, moist conjunctiva, no lid lag ?Lungs: Clear to auscultation bilaterally, normal respiratory effort ?CV: regular rate and rhythm, no murmurs, no peripheral edema, pedal pulses 2+ ?GI: Soft, no masses or hepatosplenomegaly, non-tender to palpation, Jps murky ?Skin: No rashes, palpation reveals normal turgor ?Psychiatric: appropriate judgment and insight, oriented to person, place, and time ? ?Lab Results:  ?Recent Labs  ?  12/03/21 ?0119 12/04/21 ?0713  ?WBC 14.5* 15.2*  ?HGB 9.6* 9.4*  ?HCT 29.3* 27.8*  ?PLT 492* 462*  ? ?BMET ?Recent Labs  ?  12/03/21 ?0119 12/04/21 ?0713  ?NA 135 137  ?K 3.3* 3.5  ?CL 95* 101  ?CO2 33* 30  ?GLUCOSE 96 87  ?BUN <5* 5*  ?CREATININE 0.45 0.37*  ?CALCIUM 8.1* 8.2*  ? ?PT/INR ?No results for input(s): LABPROT, INR in the last 72 hours. ?ABG ?No results for input(s): PHART, HCO3 in the last 72 hours. ? ?Invalid input(s): PCO2, PO2 ? ?Studies/Results: ?No results found. ? ?Anti-infectives: ?Anti-infectives (From admission, onward)  ? ? Start     Dose/Rate Route Frequency Ordered Stop  ? 11/27/21 2300  piperacillin-tazobactam (ZOSYN) IVPB 3.375 g       ? 3.375 g ?12.5 mL/hr over 240 Minutes Intravenous Every 8 hours 11/27/21 2202 12/04/21 2025  ? ?  ? ? ?Assessment/Plan: ?Sigmoid diverticulitis with pelvic abscesses ?S/P IR drainage x 2 3/26.  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 ?- continue IV Zosyn ?- OOB and mobilize  ?- continue to closely monitor.  ?-Repeat CT Monday ?  ?FEN: Soft/IVF ?ID: Zosyn ?VTE: SCD's, lovenox ?Foley: none ?  ?Bipolar disorder, home meds.  ? LOS: 8 days  ? ? ?Axel Filler ?12/05/2021 ? ?

## 2021-12-06 LAB — CBC
HCT: 26.5 % — ABNORMAL LOW (ref 36.0–46.0)
Hemoglobin: 8.8 g/dL — ABNORMAL LOW (ref 12.0–15.0)
MCH: 28.7 pg (ref 26.0–34.0)
MCHC: 33.2 g/dL (ref 30.0–36.0)
MCV: 86.3 fL (ref 80.0–100.0)
Platelets: 556 10*3/uL — ABNORMAL HIGH (ref 150–400)
RBC: 3.07 MIL/uL — ABNORMAL LOW (ref 3.87–5.11)
RDW: 15.2 % (ref 11.5–15.5)
WBC: 12.2 10*3/uL — ABNORMAL HIGH (ref 4.0–10.5)
nRBC: 0 % (ref 0.0–0.2)

## 2021-12-06 LAB — BASIC METABOLIC PANEL
Anion gap: 7 (ref 5–15)
BUN: 5 mg/dL — ABNORMAL LOW (ref 6–20)
CO2: 29 mmol/L (ref 22–32)
Calcium: 8.4 mg/dL — ABNORMAL LOW (ref 8.9–10.3)
Chloride: 100 mmol/L (ref 98–111)
Creatinine, Ser: 0.42 mg/dL — ABNORMAL LOW (ref 0.44–1.00)
GFR, Estimated: >60 mL/min (ref >60)
Glucose, Bld: 115 mg/dL — ABNORMAL HIGH (ref 70–99)
Potassium: 3.7 mmol/L (ref 3.5–5.1)
Sodium: 136 mmol/L (ref 135–145)

## 2021-12-06 NOTE — Progress Notes (Signed)
Physical Therapy Treatment ?Patient Details ?Name: Judith Miller ?MRN: [id number] ?DOB: March 06, 1961 ?Today's Date: 12/06/2021 ? ? ?History of Present Illness 61 y.o. female presents to [facility] hospital 11/27/2021 as a transfer from [facility] with perforated sigmoid colon. Pt underwent CT guided pelvic drain placement on 3/26. PMH includes bipolar disorder. ? ?  ?PT Comments  ? ? Pt with continued progress this session requiring less assistance, up to min guard for ambulation without an AD. Distance continues to be limited by abdominal pain. Pt continues to need cues and assistance to bring LE's into bed. Educated pt re; pressure relief for sacrum and benefits of continued mobility, pt verbalizing understanding. Pt continues to benefit from skilled PT services to progress toward functional mobility goals.  ?  ?Recommendations for follow up therapy are one component of a multi-disciplinary discharge planning process, led by the attending physician.  Recommendations may be updated based on patient status, additional functional criteria and insurance authorization. ? ?Follow Up Recommendations ? Home health PT ?  ?  ?Assistance Recommended at Discharge Intermittent Supervision/Assistance  ?Patient can return home with the following [name];[name];Assist for transportation;[name];Assistance with cooking/housework ?  ?Equipment Recommendations ? Rolling walker (2 wheels)  ?  ?Recommendations for Other Services   ? ? ?  ?Precautions / Restrictions Precautions ?Precautions: Fall ?Precaution Comments: JP drain x 2 ?Restrictions ?Weight Bearing Restrictions: No  ?  ? ?Mobility ? Bed Mobility ?Overal bed mobility: Needs Assistance ?Bed Mobility: Supine to Sit, Sit to Supine ?Rolling: Min guard ?  ?Supine to sit: Min guard ?Sit to supine: Min assist ?  ?General bed mobility comments: min assist for LE's back to bed ?   ? ?Transfers ?Overall transfer level: Needs assistance ?Equipment used: None ?Transfers: Sit to/from Stand ?Sit to Stand: Min guard ?  ?  ?  ?  ?  ?  ?  ? ?Ambulation/Gait ?Ambulation/Gait assistance: Min guard ?Gait Distance (Feet): 300 Feet ?Assistive device: None ?Gait Pattern/deviations: Step-through pattern, Trunk flexed ?Gait velocity: reduced ?  ?  ?General Gait Details: slow steady gait without AD, guarding abdomen with flexed posture, distance limited to adbomonal pain ? ? ?Stairs ?  ?  ?  ?  ?  ? ? ?Wheelchair Mobility ?  ? ?Modified Rankin (Stroke Patients Only) ?  ? ? ?  ?Balance Overall balance assessment: Needs assistance ?Sitting-balance support: No upper extremity supported, Feet supported ?Sitting balance-Leahy Scale: Good ?  ?  ?Standing balance support: Single extremity supported, No upper extremity supported ?Standing balance-Leahy Scale: Poor ?Standing balance comment: minG-minA ?  ?  ?  ?  ?  ?  ?  ?  ?  ?  ?  ?  ? ?  ?Cognition Arousal/Alertness: Awake/alert ?Behavior During Therapy: [facility] for tasks assessed/performed ?Overall Cognitive Status: Within Functional Limits for tasks assessed ?  ?  ?  ?  ?  ?  ?  ?  ?  ?  ?  ?  ?  ?  ?  ?  ?  ?  ?  ? ?  ?Exercises   ? ?  ?General Comments   ?  ?  ? ?Pertinent Vitals/Pain Pain Assessment ?Pain Assessment: Faces ?Faces Pain Scale: Hurts a little bit ?Pain Location: abdomen, buttocks ?Pain Descriptors / Indicators: Grimacing, Aching ?Pain Intervention(s): Limited activity within patient's tolerance, Monitored during session, Repositioned, Premedicated before session  ? ? ?Home Living   ?  ?  ?  ?  ?  ?  ?  ?  ?  ?   ?  ?  Prior Function    ?  ?  ?   ? ?PT Goals (current goals can now be found in the care plan section) Acute Rehab PT Goals ?Patient Stated Goal: to return to independence and reduce pain ?PT Goal Formulation: With patient ?Time For Goal Achievement: 12/15/21 ? ?  ?Frequency ? ? ? Min 3X/week ? ? ? ?  ?PT Plan    ? ? ?Co-evaluation   ?  ?  ?   ?  ? ?  ?AM-PAC PT "6 Clicks" Mobility   ?Outcome Measure ? Help needed turning from your back to your side while in a flat bed without using bedrails?: A Little ?Help needed moving from lying on your back to sitting on the side of a flat bed without using bedrails?: A Little ?Help needed moving to and from a bed to a chair (including a wheelchair)?: A Little ?Help needed standing up from a chair using your arms (e.g., wheelchair or bedside chair)?: A Little ?Help needed to walk in hospital room?: A Little ?Help needed climbing 3-5 steps with a railing? : Total ?6 Click Score: 16 ? ?  ?End of Session Equipment Utilized During Treatment: Gait belt (under axillae) ?Activity Tolerance: Patient tolerated treatment well ?Patient left: in bed;with call bell/phone within reach ?Nurse Communication: Mobility status ?PT Visit Diagnosis: Other abnormalities of gait and mobility (R26.89);Muscle weakness (generalized) (M62.81);Pain ?Pain - part of body:  (abdomen) ?  ? ? ?Time: 0932-6712 ?PT Time Calculation (min) (ACUTE ONLY): 21 min ? ?Charges:  $Therapeutic Exercise: 8-22 mins          ?          ? ?Judith Boys. PTA ?Acute Rehabilitation Services ?Office: [phone] ? ? ? ?Judith Miller ?12/06/2021, 4:01 PM ? ?

## 2021-12-06 NOTE — Progress Notes (Signed)
? ? ?Referring Physician(s): Michaelle Birks  ? ?Supervising Physician: Ruthann Cancer ? ?Patient Status:  [facility] - In-pt ? ?Chief Complaint: ?Pelvic fluid collection, s/p L TG and RLQ drain placement by Dr. Dwaine Gale on 3/26.  ? ?Subjective: ?Sitting up in bed. ?Drains largely unchanged. ?Reports intermittent abdominal pain.  ? ?Allergies: ?Sulfa antibiotics ? ?Medications: ?Prior to Admission medications   ?Medication Sig Start Date End Date Taking? Authorizing Provider  ?risperiDONE (RISPERDAL) 0.5 MG tablet Take 0.5 mg by mouth in the morning.   Yes [provider]  ?risperiDONE (RISPERDAL) 1 MG tablet Take 1.5 mg by mouth at bedtime.   Yes [provider]  ?vitamin B-12 (CYANOCOBALAMIN) 500 MCG tablet Take 500 mcg by mouth daily.   Yes [provider]  ? ? ? ?Vital Signs: ?BP (!) 102/49 (BP Location: Left Arm)   Pulse 67   Temp 98.4 ?F (36.9 ?C) (Oral)   Resp 17   Ht 5\' 4"  (1.626 m)   Wt 182 lb (82.6 kg)   SpO2 93%   BMI 31.24 kg/m?  ? ?Physical Exam ?Vitals and nursing note reviewed.  ?NAD, alert, resting comfortably ?Abdomen: RLQ drain to suction bulb. Dressing is clean, dry, and intact. 10 ml of serosanguinous fluid in bulb. L TG drain to suction bulb with foul-smelling, feculent output.  ? ?Imaging: ?CT ABDOMEN PELVIS W CONTRAST ? ?Result Date: 12/06/2021 ?CLINICAL DATA:  A 61 year old female presents for evaluation of complication of diverticulitis. EXAM: CT ABDOMEN AND PELVIS WITH CONTRAST TECHNIQUE: Multidetector CT imaging of the abdomen and pelvis was performed using the standard protocol following bolus administration of intravenous contrast. RADIATION DOSE REDUCTION: This exam was performed according to the departmental dose-optimization program which includes automated exposure control, adjustment of the mA and/or kV according to patient size and/or use of iterative reconstruction technique. CONTRAST:  100 mL Omnipaque 300. COMPARISON:  Comparison exam from November 26, 2021  FINDINGS: Lower chest: Basilar airspace disease with increasing consolidation at the RIGHT greater than LEFT lung base. Small bilateral effusions also RIGHT greater than LEFT. Hepatobiliary: Gallbladder is collapsed. No biliary duct distension. Portal vein is patent. Lenticular subcapsular collection along the RIGHT hemiliver (image 29/3) this measures 4.2 x 2.5 cm as compared to 5.0 x 2.1 cm. Appears more organized than on the previous study. No intrahepatic collection. Pancreas: Normal, without mass, inflammation or ductal dilatation. Spleen: Normal. Adrenals/Urinary Tract: Adrenal glands are normal. Symmetric renal enhancement. No hydronephrosis though there is mild fullness of the ureters most pronounced on the RIGHT and terminating at the pelvic brim. Stomach/Bowel: Post drain placement into RIGHT lower pelvic collections both anterior and posterior with decompression of these collections. Gastrointestinal tract is opacified with ingested contrast media to level of the terminal ileum. There are complex interloop collections which show further organization and there is also a collection along the RIGHT flank that shows peripheral enhancement previously not well organized. (Image 56/3) 8 x 3.5 cm as compared to 6.4 x 2.4 cm and containing gas just below the RIGHT hemiliver. Central abdominal collections within the mesentery of the small bowel, interloop fluid with more organized appearance also containing gas and better organized than on previous imaging mild increase in volume, as measured at the root of the small bowel mesentery at 10 x 5.8 cm as compared to 9.1 x 4.2 cm. With of the central of 3 ax tensions from this collection at the root of the mesentery extending into the mesenteric reflections at 4.3 cm (image 57/3) previously 3.2 cm.  No new collections. Vascular/Lymphatic: Aortic atherosclerosis. No sign of aneurysm. Smooth contour of the IVC. There is no gastrohepatic or hepatoduodenal ligament  lymphadenopathy. No retroperitoneal or mesenteric lymphadenopathy. No pelvic sidewall lymphadenopathy. Reproductive: Assessment of reproductive structures is limited due to extensive inflammation in the pelvis, no gross change. Stranding in the adnexa bilaterally related to presumed colonic perforation. Other: No pneumoperitoneum. Collections even once containing gas appear contained but are quite large. Musculoskeletal: Body wall edema.  No abdominal wall collection. IMPRESSION: 1. Post drain placement into RIGHT lower pelvic collections both anterior and posterior with decompression of these collections. 2. Complex interloop and RIGHT flank collection with increased size and further organization/walled-off appearance compatible with multiple intra-abdominal abscesses, some within the mesentery of the small bowel. 3. No pneumoperitoneum. 4. Basilar airspace disease with increasing consolidation at the RIGHT greater than LEFT lung base, findings are concerning for developing infection in the chest though could reflect volume loss, attention on follow-up. 5. Mild distension of the RIGHT slightly greater than LEFT ureter could reflect volume status though given the presence of extensive inflammation at the sacral promontory near the distal ureters would suggest close attention on follow-up to ensure no developing obstruction. 6. Small bilateral effusions also RIGHT greater than LEFT. 7. Aortic atherosclerosis. Aortic Atherosclerosis (ICD10-I70.0). Electronically Signed   By: Zetta Bills M.D.   On: 12/06/2021 13:37   ? ?Labs: ? ?CBC: ?Recent Labs  ?  12/02/21 ?0756 12/03/21 ?0119 12/04/21 ?0713 12/06/21 ?1044  ?WBC 14.9* 14.5* 15.2* 12.2*  ?HGB 10.0* 9.6* 9.4* 8.8*  ?HCT 30.5* 29.3* 27.8* 26.5*  ?PLT 442* 492* 462* 556*  ? ? ? ?COAGS: ?Recent Labs  ?  11/28/21 ?1054  ?INR 1.2  ? ? ? ?BMP: ?Recent Labs  ?  11/30/21 ?[id number] 12/03/21 ?0119 12/04/21 ?[id number] 12/06/21 ?1044  ?NA 144 135 137 136  ?K 4.7 3.3* 3.5 3.7  ?CL 101  95* 101 100  ?CO2 36* 33* 30 29  ?GLUCOSE 72 96 87 115*  ?BUN 18 <5* 5* 5*  ?CALCIUM 8.5* 8.1* 8.2* 8.4*  ?CREATININE 0.53 0.45 0.37* 0.42*  ?GFRNONAA >60 >60 >60 >60  ? ? ? ?LIVER FUNCTION TESTS: ?No results for input(s): BILITOT, AST, ALT, ALKPHOS, PROT, ALBUMIN in the last 8760 hours. ? ?Assessment and Plan: ?Pelvic fluid collection, s/p L TG and RLQ drain placement by Dr. Dwaine Gale on 3/26.  ?With ongoing abdominal pain.  ?WBC 12.2 today.  ? ? ?Drain Location: RLQ anterior pelvic ?Size: Fr size: 12 Fr ?Date of placement: 3/26 ?Currently to: suction bulb ?  ?Drain Location: left TG ?Size: Fr size: 10 Fr ?Date of placement: 3/26 ?Currently to: bulb ? ? ?24 hour output:  ?Output by Drain (mL) 12/04/21 0701 - 12/04/21 1900 12/04/21 1901 - 12/05/21 0700 12/05/21 0701 - 12/05/21 1900 12/05/21 1901 - 12/06/21 0700 12/06/21 0701 - 12/06/21 1427  ?Closed System Drain 1 Anterior;Inferior Abdomen Bulb (JP) 12 Fr.  10  20   ?Closed System Drain 2 Left Buttock Bulb (JP) 10 Fr. 25 45  15   ? ? ?CT Abdomen/Pelvis obtained.  Will review with IR MD.  ?Continue with routine drain care as ordered for now.  ? ? ? ?Electronically Signed: ?Docia Barrier, PA ?12/06/2021, 2:27 PM ? ? ?I spent a total of 15 Minutes at the the patient's bedside AND on the patient's hospital floor or unit, greater than 50% of which was counseling/coordinating care for pelvic fluid collections ? ?

## 2021-12-06 NOTE — Plan of Care (Signed)
  Problem: Education: Goal: Knowledge of General Education information will improve Description: Including pain rating scale, medication(s)/side effects and non-pharmacologic comfort measures Outcome: Progressing   Problem: Clinical Measurements: Goal: Respiratory complications will improve Outcome: Progressing Goal: Cardiovascular complication will be avoided Outcome: Progressing   Problem: Activity: Goal: Risk for activity intolerance will decrease Outcome: Progressing   Problem: Elimination: Goal: Will not experience complications related to urinary retention Outcome: Progressing   

## 2021-12-06 NOTE — Progress Notes (Signed)
? ?  Subjective/Chief Complaint: ?Pt tol FLD ?Having some pain ? ? ? ?Objective: ?Vital signs in last 24 hours: ?Temp:  [97.4 ?F (36.3 ?C)-99.1 ?F (37.3 ?C)] 98.4 ?F (36.9 ?C) (04/03 8250) ?Pulse Rate:  [67-87] 67 (04/03 0728) ?Resp:  [17-18] 17 (04/03 0728) ?BP: (101-141)/(49-67) 102/49 (04/03 0728) ?SpO2:  [92 %-95 %] 93 % (04/03 0728) ?Last BM Date : 12/03/21 ? ?Intake/Output from previous day: ?04/02 0701 - 04/03 0700 ?In: 800 [P.O.:800] ?Out: 35 [Drains:35] ?Intake/Output this shift: ?Total I/O ?In: 280 [P.O.:280] ?Out: -  ? ? ?PE: ?  ?Constitutional: No acute distress, conversant, appears states age. ?Eyes: Anicteric sclerae, moist conjunctiva, no lid lag ?Lungs: Clear to auscultation bilaterally, normal respiratory effort ?CV: regular rate and rhythm, no murmurs, no peripheral edema, pedal pulses 2+ ?GI: Soft, no masses or hepatosplenomegaly, non-tender to palpation, Jps murky ?Skin: No rashes, palpation reveals normal turgor ?Psychiatric: appropriate judgment and insight, oriented to person, place, and time ? ?Lab Results:  ?Recent Labs  ?  12/04/21 ?0713  ?WBC 15.2*  ?HGB 9.4*  ?HCT 27.8*  ?PLT 462*  ? ?BMET ?Recent Labs  ?  12/04/21 ?0713  ?NA 137  ?K 3.5  ?CL 101  ?CO2 30  ?GLUCOSE 87  ?BUN 5*  ?CREATININE 0.37*  ?CALCIUM 8.2*  ? ?PT/INR ?No results for input(s): LABPROT, INR in the last 72 hours. ?ABG ?No results for input(s): PHART, HCO3 in the last 72 hours. ? ?Invalid input(s): PCO2, PO2 ? ?Studies/Results: ?No results found. ? ?Anti-infectives: ?Anti-infectives (From admission, onward)  ? ? Start     Dose/Rate Route Frequency Ordered Stop  ? 11/27/21 2300  piperacillin-tazobactam (ZOSYN) IVPB 3.375 g       ? 3.375 g ?12.5 mL/hr over 240 Minutes Intravenous Every 8 hours 11/27/21 2202 12/04/21 2025  ? ?  ? ? ?Assessment/Plan: ?Sigmoid diverticulitis with pelvic abscesses ?S/P IR drainage x 2 3/26.  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 ?- continue IV Zosyn ?- OOB and mobilize  ?- continue to closely monitor.  ?-Repeat CT from yesterday pending final read -- looks like there is still some abscess in her pelvis that communicated with transgluteal drain. I do not see extravasation of contrast from the intestine.  ?  ?FEN: Soft/IVF ?ID: Zosyn ?VTE: SCD's, lovenox ?Foley: none ?  ?Bipolar disorder, home meds.  ? LOS: 9 days  ? ? ?Judith Miller ?12/06/2021 ? ?

## 2021-12-06 NOTE — Progress Notes (Signed)
Mobility Specialist Progress Note: ? ? 12/06/21 1126  ?Mobility  ?Activity Ambulated with assistance in hallway;Ambulated with assistance in room;Ambulated with assistance to bathroom  ?Level of Assistance Independent after set-up  ?Assistive Device None  ?Distance Ambulated (ft) 400 ft  ?Activity Response Tolerated well  ?$Mobility charge 1 Mobility  ? ?Pt received getting up from bed to go to bathroom. Complaints of 7/10 pain at the beginning of ambulation which then increased to 9/10 at end of ambulation. Left EOB with call bell in reach and all needs met.  ? ?  ?Mobility Specialist ?Primary Phone [phone] ? ?

## 2021-12-06 NOTE — Progress Notes (Signed)
Mobility Specialist Progress Note: ? ? 12/06/21 1254  ?Mobility  ?Activity Ambulated with assistance in room;Ambulated with assistance to bathroom  ?Level of Assistance Independent  ?Assistive Device None  ?Distance Ambulated (ft) 200 ft  ?Activity Response Tolerated well  ?$Mobility charge 1 Mobility  ? ?Pt received walking in room. Abdominal pain which limits endurance but pt still wanting to ambulate. Left EOB with call bell in reach and all needs met.  ? ?Jinx Gilden ?Mobility Specialist ?Primary Phone [phone] ? ?

## 2021-12-07 ENCOUNTER — Inpatient Hospital Stay (HOSPITAL_COMMUNITY): Payer: Self-pay

## 2021-12-07 LAB — CBC
HCT: 25.9 % — ABNORMAL LOW (ref 36.0–46.0)
Hemoglobin: 8.5 g/dL — ABNORMAL LOW (ref 12.0–15.0)
MCH: 28.2 pg (ref 26.0–34.0)
MCHC: 32.8 g/dL (ref 30.0–36.0)
MCV: 86 fL (ref 80.0–100.0)
Platelets: 555 10*3/uL — ABNORMAL HIGH (ref 150–400)
RBC: 3.01 MIL/uL — ABNORMAL LOW (ref 3.87–5.11)
RDW: 14.8 % (ref 11.5–15.5)
WBC: 10.5 10*3/uL (ref 4.0–10.5)
nRBC: 0 % (ref 0.0–0.2)

## 2021-12-07 MED ORDER — FENTANYL CITRATE (PF) 100 MCG/2ML IJ SOLN
INTRAMUSCULAR | Status: AC
  Filled 2021-12-07: qty 4

## 2021-12-07 MED ORDER — MIDAZOLAM HCL 2 MG/2ML IJ SOLN
INTRAMUSCULAR | Status: AC
  Filled 2021-12-07: qty 4

## 2021-12-07 MED ORDER — MIDAZOLAM HCL 2 MG/2ML IJ SOLN
INTRAMUSCULAR | Status: AC | PRN
  Administered 2021-12-07: .5 mg via INTRAVENOUS
  Administered 2021-12-07: 1 mg via INTRAVENOUS
  Administered 2021-12-07: .5 mg via INTRAVENOUS

## 2021-12-07 MED ORDER — LIDOCAINE HCL 1 % IJ SOLN
INTRAMUSCULAR | Status: AC
  Filled 2021-12-07: qty 10

## 2021-12-07 MED ORDER — FENTANYL CITRATE (PF) 100 MCG/2ML IJ SOLN
INTRAMUSCULAR | Status: AC | PRN
  Administered 2021-12-07 (×2): 25 ug via INTRAVENOUS
  Administered 2021-12-07: 50 ug via INTRAVENOUS

## 2021-12-07 MED ORDER — OXYCODONE HCL 5 MG PO TABS
5.0000 mg | ORAL_TABLET | ORAL | Status: DC | PRN
  Administered 2021-12-07 – 2021-12-15 (×31): 10 mg via ORAL
  Filled 2021-12-07 (×31): qty 2

## 2021-12-07 NOTE — Progress Notes (Signed)
PT Cancellation Note ? ?Patient Details ?Name: Judith Miller ?MRN: [id number] ?DOB: 10/15/1960 ? ? ?Cancelled Treatment:    Reason Eval/Treat Not Completed: Patient declined, no reason specified.  Initially refused for pain and then to have a meal after returning from her procedure. ? ? ?Ivar Drape ?12/07/2021, 1:28 PM ? ?Samul Dada, PT PhD ?Acute Rehab Dept. Number: [facility] [phone] and [facility] [phone] ? ?

## 2021-12-07 NOTE — Sedation Documentation (Signed)
Vital signs stable. 

## 2021-12-07 NOTE — Progress Notes (Signed)
Mobility Specialist Progress Note: ? ? 12/07/21 1211  ?Mobility  ?Activity Refused mobility  ? ?Will follow-up as time allows. ? ?Judith Miller ?Mobility Specialist ?Primary Phone [phone] ? ?

## 2021-12-07 NOTE — Sedation Documentation (Signed)
Pt tolerated procedure well. ? ?Totals: ?Time 23 mins ?Fentanyl ?Versed 2mg  ?

## 2021-12-07 NOTE — Progress Notes (Signed)
PT Cancellation Note ? ?Patient Details ?Name: Judith Miller ?MRN: [id number] ?DOB: 05-15-1961 ? ? ?Cancelled Treatment:    Reason Eval/Treat Not Completed: Patient at procedure or test/unavailable.  Retry as time and pt allow. ? ? ?Ivar Drape ?12/07/2021, 9:35 AM ? ?Samul Dada, PT PhD ?Acute Rehab Dept. Number: [facility] [phone] and [facility] [phone] ? ?

## 2021-12-07 NOTE — Procedures (Signed)
Vascular and Interventional Radiology Procedure Note ? ?Patient: Judith Miller ?DOB: October 05, 1960 ?Medical Record Number: [id number] ?Note Date/Time: 12/07/21 11:33 AM  ? ?Performing Physician: Roanna Banning, MD ?Assistant(s): None ? ?Diagnosis: Intra-abdominal abscess ? ?Procedure: DRAINAGE CATHETER PLACEMENT into RLQ ABSCESS ? ?Anesthesia: Conscious Sedation ?Complications: None ?Estimated Blood Loss: Minimal ?Specimens: Sent for Gram Stain, Aerobe Culture, and Anerobe Culture ? ?Findings:  ?Successful CT-guided placement of 14 F catheter into RLQ abscess. ? ?Plan:  ?- Flush drain with 5 mL Normal Saline every 8 hours. ?- Follow up drain evaluation / sinogram in 2 week(s). ? ?See detailed procedure note with images in PACS. ?The patient tolerated the procedure well without incident or complication and was returned to Floor Bed in stable condition.  ? ? ?Roanna Banning, MD ?[facility] ?[facility] ? ? ?Pager. [phone] ?Clinic. [phone]  ?

## 2021-12-07 NOTE — Sedation Documentation (Signed)
Patient is resting comfortably. Procedure started °

## 2021-12-07 NOTE — Sedation Documentation (Signed)
Vital signs stable. Pt grimacing 

## 2021-12-07 NOTE — Progress Notes (Signed)
Mobility Specialist Progress Note: ? ? 12/07/21 1454  ?Mobility  ?Activity Stood at bedside  ?Level of Assistance Modified independent, requires aide device or extra time  ?Assistive Device None  ?Distance Ambulated (ft) 2 ft  ?Activity Response Tolerated fair  ?$Mobility charge 1 Mobility  ? ?Pt received in bed and with max encouragement agreed to try to walk. Pt in a lot of pain at the new drain site. Sat EOB and pt was in too much pain but needed the pad under her changed so pt stood briefly then got back in bed. Need assistance getting feet into bed. Left in bed with call bell in reach and all needs met.  ? ?Mia Milan ?Mobility Specialist ?Primary Phone [phone] ? ?

## 2021-12-07 NOTE — Progress Notes (Signed)
? ?Subjective/Chief Complaint: ?Pt just returned from IR s/p 3rd perc drain placement. Complaining of pain after drain placement. Reports she was not having worsened pain with PO intake yesterday.  ? ?Objective: ?Vital signs in last 24 hours: ?Temp:  [97.4 ?F (36.3 ?C)-99 ?F (37.2 ?C)] 98.4 ?F (36.9 ?C) (04/04 2585) ?Pulse Rate:  [75-88] 75 (04/04 1007) ?Resp:  [15-20] 16 (04/04 1007) ?BP: (99-141)/(49-67) 114/54 (04/04 1007) ?SpO2:  [84 %-98 %] 91 % (04/04 1014) ?Last BM Date : 12/06/21 ? ?Intake/Output from previous day: ?04/03 0701 - 04/04 0700 ?In: 1542.5 [P.O.:860; I.V.:672.5] ?Out: 52 [Drains:75] ?Intake/Output this shift: ?No intake/output data recorded. ? ? ?PE: ?Constitutional: No acute distress, conversant ?Lungs: normal respiratory effort ?CV: regular rate and rhythm  ?GI: Soft, ttp at superior anterior abdominal drain site; TG drain purulent, superior anterior drain purulent, inferior anterior drain with scant fluid but looks more lacteal vs serous, ND ?Skin: No rashes, palpation reveals normal turgor ?Psychiatric: appropriate judgment and insight, oriented to person, place, and time ? ?Lab Results:  ?Recent Labs  ?  12/06/21 ?1044 12/07/21 ?0741  ?WBC 12.2* 10.5  ?HGB 8.8* 8.5*  ?HCT 26.5* 25.9*  ?PLT 556* 555*  ? ? ?BMET ?Recent Labs  ?  12/06/21 ?1044  ?NA 136  ?K 3.7  ?CL 100  ?CO2 29  ?GLUCOSE 115*  ?BUN 5*  ?CREATININE 0.42*  ?CALCIUM 8.4*  ? ? ?PT/INR ?No results for input(s): LABPROT, INR in the last 72 hours. ?ABG ?No results for input(s): PHART, HCO3 in the last 72 hours. ? ?Invalid input(s): PCO2, PO2 ? ?Studies/Results: ?CT ABDOMEN PELVIS W CONTRAST ? ?Result Date: 12/06/2021 ?CLINICAL DATA:  A 61 year old female presents for evaluation of complication of diverticulitis. EXAM: CT ABDOMEN AND PELVIS WITH CONTRAST TECHNIQUE: Multidetector CT imaging of the abdomen and pelvis was performed using the standard protocol following bolus administration of intravenous contrast. RADIATION DOSE  REDUCTION: This exam was performed according to the departmental dose-optimization program which includes automated exposure control, adjustment of the mA and/or kV according to patient size and/or use of iterative reconstruction technique. CONTRAST:  100 mL Omnipaque 300. COMPARISON:  Comparison exam from November 26, 2021 FINDINGS: Lower chest: Basilar airspace disease with increasing consolidation at the RIGHT greater than LEFT lung base. Small bilateral effusions also RIGHT greater than LEFT. Hepatobiliary: Gallbladder is collapsed. No biliary duct distension. Portal vein is patent. Lenticular subcapsular collection along the RIGHT hemiliver (image 29/3) this measures 4.2 x 2.5 cm as compared to 5.0 x 2.1 cm. Appears more organized than on the previous study. No intrahepatic collection. Pancreas: Normal, without mass, inflammation or ductal dilatation. Spleen: Normal. Adrenals/Urinary Tract: Adrenal glands are normal. Symmetric renal enhancement. No hydronephrosis though there is mild fullness of the ureters most pronounced on the RIGHT and terminating at the pelvic brim. Stomach/Bowel: Post drain placement into RIGHT lower pelvic collections both anterior and posterior with decompression of these collections. Gastrointestinal tract is opacified with ingested contrast media to level of the terminal ileum. There are complex interloop collections which show further organization and there is also a collection along the RIGHT flank that shows peripheral enhancement previously not well organized. (Image 56/3) 8 x 3.5 cm as compared to 6.4 x 2.4 cm and containing gas just below the RIGHT hemiliver. Central abdominal collections within the mesentery of the small bowel, interloop fluid with more organized appearance also containing gas and better organized than on previous imaging mild increase in volume, as measured at the root of the small  bowel mesentery at 10 x 5.8 cm as compared to 9.1 x 4.2 cm. With of the central of  3 ax tensions from this collection at the root of the mesentery extending into the mesenteric reflections at 4.3 cm (image 57/3) previously 3.2 cm. No new collections. Vascular/Lymphatic: Aortic atherosclerosis. No sign of aneurysm. Smooth contour of the IVC. There is no gastrohepatic or hepatoduodenal ligament lymphadenopathy. No retroperitoneal or mesenteric lymphadenopathy. No pelvic sidewall lymphadenopathy. Reproductive: Assessment of reproductive structures is limited due to extensive inflammation in the pelvis, no gross change. Stranding in the adnexa bilaterally related to presumed colonic perforation. Other: No pneumoperitoneum. Collections even once containing gas appear contained but are quite large. Musculoskeletal: Body wall edema.  No abdominal wall collection. IMPRESSION: 1. Post drain placement into RIGHT lower pelvic collections both anterior and posterior with decompression of these collections. 2. Complex interloop and RIGHT flank collection with increased size and further organization/walled-off appearance compatible with multiple intra-abdominal abscesses, some within the mesentery of the small bowel. 3. No pneumoperitoneum. 4. Basilar airspace disease with increasing consolidation at the RIGHT greater than LEFT lung base, findings are concerning for developing infection in the chest though could reflect volume loss, attention on follow-up. 5. Mild distension of the RIGHT slightly greater than LEFT ureter could reflect volume status though given the presence of extensive inflammation at the sacral promontory near the distal ureters would suggest close attention on follow-up to ensure no developing obstruction. 6. Small bilateral effusions also RIGHT greater than LEFT. 7. Aortic atherosclerosis. Aortic Atherosclerosis (ICD10-I70.0). Electronically Signed   By: Donzetta Kohut M.D.   On: 12/06/2021 13:37   ? ?Anti-infectives: ?Anti-infectives (From admission, onward)  ? ? Start     Dose/Rate Route  Frequency Ordered Stop  ? 11/27/21 2300  piperacillin-tazobactam (ZOSYN) IVPB 3.375 g       ? 3.375 g ?12.5 mL/hr over 240 Minutes Intravenous Every 8 hours 11/27/21 2202 12/04/21 2025  ? ?  ? ? ?Assessment/Plan: ?Sigmoid diverticulitis with pelvic abscesses ?S/P IR drainage x 2 3/26.  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  ?- CT 4/2: decompression of previously drained collections, complex R flank collection increased in size ?- s/p IR drain placement today  ?  ?FEN: Soft/IVF ?ID: Zosyn ?VTE: SCD's, lovenox ?Foley: none ? ?Small bilateral pleural effusions with atelectasis vs consolidation - seen on CT 4/2, on abx, needing some supplemental oxygen after IR procedure but got sedation, encourage IS and continue to monitor  ?Bipolar disorder, home meds.  ? LOS: 10 days  ? ?I independently reviewed and agree with radiologist assessment of CT 4/2. I reviewed CBC and abscess cultures.  ? ? ?Juliet Rude, PA-C ?[facility] ?12/07/2021, 11:16 AM ?Please see Amion for pager number during day hours 7:00am-4:30pm ? ? ?

## 2021-12-08 LAB — BASIC METABOLIC PANEL
Anion gap: 6 (ref 5–15)
BUN: 8 mg/dL (ref 6–20)
CO2: 29 mmol/L (ref 22–32)
Calcium: 8.8 mg/dL — ABNORMAL LOW (ref 8.9–10.3)
Chloride: 100 mmol/L (ref 98–111)
Creatinine, Ser: 0.48 mg/dL (ref 0.44–1.00)
GFR, Estimated: >60 mL/min (ref >60)
Glucose, Bld: 87 mg/dL (ref 70–99)
Potassium: 4 mmol/L (ref 3.5–5.1)
Sodium: 135 mmol/L (ref 135–145)

## 2021-12-08 LAB — CBC
HCT: 25.7 % — ABNORMAL LOW (ref 36.0–46.0)
Hemoglobin: 8.8 g/dL — ABNORMAL LOW (ref 12.0–15.0)
MCH: 29.1 pg (ref 26.0–34.0)
MCHC: 34.2 g/dL (ref 30.0–36.0)
MCV: 85.1 fL (ref 80.0–100.0)
Platelets: 529 10*3/uL — ABNORMAL HIGH (ref 150–400)
RBC: 3.02 MIL/uL — ABNORMAL LOW (ref 3.87–5.11)
RDW: 14.9 % (ref 11.5–15.5)
WBC: 10.6 10*3/uL — ABNORMAL HIGH (ref 4.0–10.5)
nRBC: 0 % (ref 0.0–0.2)

## 2021-12-08 MED ORDER — ALUM & MAG HYDROXIDE-SIMETH 200-200-20 MG/5ML PO SUSP
30.0000 mL | ORAL | Status: DC | PRN
  Administered 2021-12-11: 30 mL via ORAL
  Filled 2021-12-08 (×3): qty 30

## 2021-12-08 MED ORDER — PIPERACILLIN-TAZOBACTAM 3.375 G IVPB
3.3750 g | Freq: Three times a day (TID) | INTRAVENOUS | Status: DC
  Administered 2021-12-08 – 2021-12-15 (×21): 3.375 g via INTRAVENOUS
  Filled 2021-12-08 (×21): qty 50

## 2021-12-08 NOTE — Progress Notes (Signed)
? ?Subjective/Chief Complaint: ?Reports abd pain a new drain site. Tolerating SOFT diet and having BMs.  ?Afebrile, vitals stable ? ?Objective: ?Vital signs in last 24 hours: ?Temp:  [97.5 ?F (36.4 ?C)-100.7 ?F (38.2 ?C)] 97.5 ?F (36.4 ?C) (04/05 95620807) ?Pulse Rate:  [79-88] 79 (04/05 0807) ?Resp:  [17-18] 18 (04/05 13080807) ?BP: (117-144)/(54-61) 128/54 (04/05 65780807) ?SpO2:  [90 %-93 %] 92 % (04/05 0807) ?Last BM Date : 12/06/21 ? ?Intake/Output from previous day: ?04/04 0701 - 04/05 0700 ?In: 1034 [P.O.:360; I.V.:644] ?Out: 1585 [Urine:1400; Drains:185] ?Intake/Output this shift: ?Total I/O ?In: 980.7 [I.V.:965.7; Other:15] ?Out: -  ? ? ?PE: ?Constitutional: No acute distress, conversant ?Lungs: normal respiratory effort ?CV: regular rate and rhythm  ?GI: Soft, ttp at superior anterior abdominal drain site; TG drain purulent, superior anterior drain purulent, inferior anterior drain with scant fluid  ?Skin: No rashes, palpation reveals normal turgor ?Psychiatric: appropriate judgment and insight, oriented to person, place, and time ? ?Lab Results:  ?Recent Labs  ?  12/07/21 ?0741 12/08/21 ?0234  ?WBC 10.5 10.6*  ?HGB 8.5* 8.8*  ?HCT 25.9* 25.7*  ?PLT 555* 529*  ? ?BMET ?Recent Labs  ?  12/06/21 ?1044 12/08/21 ?0234  ?NA 136 135  ?K 3.7 4.0  ?CL 100 100  ?CO2 29 29  ?GLUCOSE 115* 87  ?BUN 5* 8  ?CREATININE 0.42* 0.48  ?CALCIUM 8.4* 8.8*  ? ?PT/INR ?No results for input(s): LABPROT, INR in the last 72 hours. ?ABG ?No results for input(s): PHART, HCO3 in the last 72 hours. ? ?Invalid input(s): PCO2, PO2 ? ?Studies/Results: ?CT ABDOMEN PELVIS W CONTRAST ? ?Result Date: 12/06/2021 ?CLINICAL DATA:  A 61 year old female presents for evaluation of complication of diverticulitis. EXAM: CT ABDOMEN AND PELVIS WITH CONTRAST TECHNIQUE: Multidetector CT imaging of the abdomen and pelvis was performed using the standard protocol following bolus administration of intravenous contrast. RADIATION DOSE REDUCTION: This exam was performed  according to the departmental dose-optimization program which includes automated exposure control, adjustment of the mA and/or kV according to patient size and/or use of iterative reconstruction technique. CONTRAST:  100 mL Omnipaque 300. COMPARISON:  Comparison exam from November 26, 2021 FINDINGS: Lower chest: Basilar airspace disease with increasing consolidation at the RIGHT greater than LEFT lung base. Small bilateral effusions also RIGHT greater than LEFT. Hepatobiliary: Gallbladder is collapsed. No biliary duct distension. Portal vein is patent. Lenticular subcapsular collection along the RIGHT hemiliver (image 29/3) this measures 4.2 x 2.5 cm as compared to 5.0 x 2.1 cm. Appears more organized than on the previous study. No intrahepatic collection. Pancreas: Normal, without mass, inflammation or ductal dilatation. Spleen: Normal. Adrenals/Urinary Tract: Adrenal glands are normal. Symmetric renal enhancement. No hydronephrosis though there is mild fullness of the ureters most pronounced on the RIGHT and terminating at the pelvic brim. Stomach/Bowel: Post drain placement into RIGHT lower pelvic collections both anterior and posterior with decompression of these collections. Gastrointestinal tract is opacified with ingested contrast media to level of the terminal ileum. There are complex interloop collections which show further organization and there is also a collection along the RIGHT flank that shows peripheral enhancement previously not well organized. (Image 56/3) 8 x 3.5 cm as compared to 6.4 x 2.4 cm and containing gas just below the RIGHT hemiliver. Central abdominal collections within the mesentery of the small bowel, interloop fluid with more organized appearance also containing gas and better organized than on previous imaging mild increase in volume, as measured at the root of the small bowel mesentery at  10 x 5.8 cm as compared to 9.1 x 4.2 cm. With of the central of 3 ax tensions from this collection  at the root of the mesentery extending into the mesenteric reflections at 4.3 cm (image 57/3) previously 3.2 cm. No new collections. Vascular/Lymphatic: Aortic atherosclerosis. No sign of aneurysm. Smooth contour of the IVC. There is no gastrohepatic or hepatoduodenal ligament lymphadenopathy. No retroperitoneal or mesenteric lymphadenopathy. No pelvic sidewall lymphadenopathy. Reproductive: Assessment of reproductive structures is limited due to extensive inflammation in the pelvis, no gross change. Stranding in the adnexa bilaterally related to presumed colonic perforation. Other: No pneumoperitoneum. Collections even once containing gas appear contained but are quite large. Musculoskeletal: Body wall edema.  No abdominal wall collection. IMPRESSION: 1. Post drain placement into RIGHT lower pelvic collections both anterior and posterior with decompression of these collections. 2. Complex interloop and RIGHT flank collection with increased size and further organization/walled-off appearance compatible with multiple intra-abdominal abscesses, some within the mesentery of the small bowel. 3. No pneumoperitoneum. 4. Basilar airspace disease with increasing consolidation at the RIGHT greater than LEFT lung base, findings are concerning for developing infection in the chest though could reflect volume loss, attention on follow-up. 5. Mild distension of the RIGHT slightly greater than LEFT ureter could reflect volume status though given the presence of extensive inflammation at the sacral promontory near the distal ureters would suggest close attention on follow-up to ensure no developing obstruction. 6. Small bilateral effusions also RIGHT greater than LEFT. 7. Aortic atherosclerosis. Aortic Atherosclerosis (ICD10-I70.0). Electronically Signed   By: Donzetta Kohut M.D.   On: 12/06/2021 13:37  ? ?CT IMAGE GUIDED DRAINAGE BY PERCUTANEOUS CATHETER ? ?Result Date: 12/07/2021 ?INDICATION: Briefly, 61 year old female with  multiple diverticular abscesses s/p previous drain placement with persistent inferior hepatic collection. EXAM: CT GUIDED DRAINAGE CATHETER PLACEMENT INTO RIGHT LOWER QUADRANT ABSCESS RADIATION DOSE REDUCTION: This exam was performed according to the departmental dose-optimization program which includes automated exposure control, adjustment of the mA and/or kV according to patient size and/or use of iterative reconstruction technique. COMPARISON:  CT AP, 12/05/2021.  IR CT, 11/28/2021. MEDICATIONS: The patient is currently admitted to the hospital and receiving intravenous antibiotics. The antibiotics were administered within an appropriate time frame prior to the initiation of the procedure. ANESTHESIA/SEDATION: Moderate (conscious) sedation was employed during this procedure. A total of Versed 2 mg and Fentanyl 100 mcg was administered intravenously. Moderate Sedation Time: 23 minutes. The patient's level of consciousness and vital signs were monitored continuously by radiology nursing throughout the procedure under my direct supervision. CONTRAST:  None COMPLICATIONS: None immediate. PROCEDURE: Informed written consent was obtained from the patient after a discussion of the risks, benefits and alternatives to treatment. The patient was placed supine on the CT gantry and a pre procedural CT was performed re-demonstrating the known abscess/fluid collection within the RIGHT lower quadrant. The procedure was planned. A timeout was performed prior to the initiation of the procedure. The RIGHT lower quadrant was prepped and draped in the usual sterile fashion. The overlying soft tissues were anesthetized with 1% lidocaine with epinephrine. Appropriate trajectory was planned with the use of a 22 gauge spinal needle. An 18 gauge trocar needle was advanced into the abscess/fluid collection and a short Amplatz super stiff wire was coiled within the collection. Appropriate positioning was confirmed with a limited CT scan.  The tract was serially dilated allowing placement of a 14 Fr all-purpose drainage catheter. Appropriate positioning was confirmed with a limited postprocedural CT scan. 10 mL  of purulent fluid was aspira

## 2021-12-08 NOTE — Progress Notes (Signed)
Nutrition Follow-up ? ?DOCUMENTATION CODES:  ?Not applicable ? ?INTERVENTION:  ?Continue current diet as ordered, encourage PO intake ?Add request for easy to chew foods in dining software. ?Continue Boost Breeze po TID, each supplement provides 250 kcal and 9 grams of protein  ?Continue MVI with minerals daily ? ?NUTRITION DIAGNOSIS:  ?Inadequate oral intake related to other (see comment) (restricted diet) as evidenced by NPO status (NPO status x 4 days). ?- remains applicable ? ?GOAL:  ?Patient will meet greater than or equal to 90% of their needs ?- progressing, diet advanced, supplements in place ? ?MONITOR:  ?PO intake, Supplement acceptance, Diet advancement, Labs, Weight trends, I & O's ? ?REASON FOR ASSESSMENT:  ?NPO/Clear Liquid Diet ?  ? ?ASSESSMENT:  ?60 year old female with no relevant medical hx admitted from [facility] after imaging showed a perforated sigmoid colon and pt was in need of radiology interventions. ? ?3/26 - Ct guided drain placement x2 in IR ?4/2 - repeat imaging showed RLQ abscess ?4/4 - additional drain placed in IR ? ?Pt resting in bedside chair at the time of assessment. Reports she has a great appetite and really likes the boost breeze supplements. Prefers to receive them over the milk-based supplements. Will continue current interventions for now. Noted pt has poor dentition. States she is unable to chew the canned pineapple she received for lunch. Will add notes to dining software to send soft foods for pt.  ? ?Noted that per surgery, if pt's clinical picture worsens, plan is for ex lap with partial colectomy/colostomy  ? ?Average Meal Intake: ?3/30-4/3: 82% intake x 6 recorded meals ? ?Nutritionally Relevant Medications: ?Scheduled Meds: ? Boost Breeze   1 Container Oral TID BM  ? multivitamin with minerals  1 tablet Oral Daily  ? ?PRN Meds: ondansetron ? ?Labs Reviewed  ? ?NUTRITION - FOCUSED PHYSICAL EXAM: ?Flowsheet Row Most Recent Value  ?Orbital Region No depletion   ?Upper Arm Region No depletion  ?Thoracic and Lumbar Region No depletion  ?Buccal Region No depletion  ?Temple Region Mild depletion  ?Clavicle Bone Region No depletion  ?Clavicle and Acromion Bone Region No depletion  ?Scapular Bone Region Mild depletion  ?Dorsal Hand No depletion  ?Patellar Region No depletion  ?Anterior Thigh Region Mild depletion  ?Posterior Calf Region Mild depletion  ?Edema (RD Assessment) None  ?Hair Reviewed  ?Eyes Reviewed  ?Mouth Reviewed  ?Skin Reviewed  ?Nails Reviewed  ? ?Diet Order:   ?Diet Order   ? ?       ?  DIET SOFT Room service appropriate? Yes; Fluid consistency: Thin  Diet effective now       ?  ? ?  ?  ? ?  ? ? ?EDUCATION NEEDS:  ?Education needs have been addressed ? ?Skin:  Skin Assessment: Reviewed RN Assessment ? ?Last BM:  4/3 ? ?Height:  ?Ht Readings from Last 1 Encounters:  ?12/02/21 5\' 4"  (1.626 m)  ? ?Weight:  ?Wt Readings from Last 1 Encounters:  ?12/02/21 82.6 kg  ? ? ?Ideal Body Weight:  54.5 kg ? ?BMI:  Body mass index is 31.24 kg/m?. ? ?Estimated Nutritional Needs:  ?Kcal:  1800-2000 ?Protein:  90-100 ?Fluid:  >/= 2 L ? ? ?12/04/21, RD, LDN ?Clinical Dietitian ?RD pager # available in [facility]ION  ?After hours/weekend pager # available in [facility]ION ?

## 2021-12-08 NOTE — Progress Notes (Signed)
? ? ?Referring Physician(s): Sophronia Simas  ? ?Supervising Physician: Gilmer Mor ? ?Patient Status:  [facility] - In-pt ? ?Chief Complaint: ? ?Pelvic fluid collection, s/p L TG and RLQ drain placement by Dr. Bryn Gulling on 3/26.  ?Additional 14 fr RLQ (exits at RUQ) drain placement on 4/4 with Dr. Milford Cage.  ? ?Subjective: ? ?Patient laying in bed, NAD.  ?Reports soreness and pain on RUQ, no chills,fever, N/V.  ?Reports that the procedure yesterday was quite painful.  ?Informed the patient that the drain we placed yesterday is the biggest drain she got so far, expect that it will be sore around the exit site for couple days.  ? ?Allergies: ?Sulfa antibiotics ? ?Medications: ?Prior to Admission medications   ?Medication Sig Start Date End Date Taking? Authorizing Provider  ?risperiDONE (RISPERDAL) 0.5 MG tablet Take 0.5 mg by mouth in the morning.   Yes [provider]  ?risperiDONE (RISPERDAL) 1 MG tablet Take 1.5 mg by mouth at bedtime.   Yes [provider]  ?vitamin B-12 (CYANOCOBALAMIN) 500 MCG tablet Take 500 mcg by mouth daily.   Yes [provider]  ? ? ? ?Vital Signs: ?BP (!) 128/54 (BP Location: Right Arm)   Pulse 79   Temp (!) 97.5 ?F (36.4 ?C) (Oral)   Resp 18   Ht 5\' 4"  (1.626 m)   Wt 182 lb (82.6 kg)   SpO2 92%   BMI 31.24 kg/m?  ? ?Physical Exam ?Vitals reviewed.  ?Constitutional:   ?   General: She is not in acute distress. ?   Appearance: Normal appearance. She is not ill-appearing.  ?HENT:  ?   Head: Normocephalic and atraumatic.  ?Pulmonary:  ?   Effort: Pulmonary effort is normal.  ?Abdominal:  ?   Palpations: Abdomen is soft.  ?Musculoskeletal:  ?   Cervical back: Neck supple.  ?Skin: ?   General: Skin is warm and dry.  ?   Coloration: Skin is not jaundiced or pale.  ?   Comments: Drain #1. Positive RLQ drain to suction bulb. Site is unremarkable with no erythema, edema, tenderness, bleeding or drainage. Dressing is clean, dry, and intact. 5 ml of clear purulent fluid noted  in the bulb. Drain aspirates and flushes well.  ? ?Drain # 2. Positive left TG drain to suction bulb. Site is unremarkable with no erythema, edema, tenderness, bleeding or drainage. Dressing is clean, dry, and intact. 10 ml of  purulent fluid noted in the bulb. Drain aspirates and flushes well.  ? ?Drain #3. Positive RUQ drain to a suction bulb. Site is unremarkable with no erythema, edema, tenderness, bleeding or drainage. Suture and stat lock in place. Dressing is clean, dry, and intact. 10 ml of purulent fluid noted in the bulb. Drain aspirates and flushes well.  ? ? ?  ?Neurological:  ?   Mental Status: She is alert and oriented to person, place, and time.  ?Psychiatric:     ?   Mood and Affect: Mood normal.     ?   Behavior: Behavior normal.  ? ? ?Imaging: ?CT ABDOMEN PELVIS W CONTRAST ? ?Result Date: 12/06/2021 ?CLINICAL DATA:  A 61 year old female presents for evaluation of complication of diverticulitis. EXAM: CT ABDOMEN AND PELVIS WITH CONTRAST TECHNIQUE: Multidetector CT imaging of the abdomen and pelvis was performed using the standard protocol following bolus administration of intravenous contrast. RADIATION DOSE REDUCTION: This exam was performed according to the departmental dose-optimization program which includes automated exposure control, adjustment of the mA and/or kV  according to patient size and/or use of iterative reconstruction technique. CONTRAST:  100 mL Omnipaque 300. COMPARISON:  Comparison exam from November 26, 2021 FINDINGS: Lower chest: Basilar airspace disease with increasing consolidation at the RIGHT greater than LEFT lung base. Small bilateral effusions also RIGHT greater than LEFT. Hepatobiliary: Gallbladder is collapsed. No biliary duct distension. Portal vein is patent. Lenticular subcapsular collection along the RIGHT hemiliver (image 29/3) this measures 4.2 x 2.5 cm as compared to 5.0 x 2.1 cm. Appears more organized than on the previous study. No intrahepatic collection. Pancreas:  Normal, without mass, inflammation or ductal dilatation. Spleen: Normal. Adrenals/Urinary Tract: Adrenal glands are normal. Symmetric renal enhancement. No hydronephrosis though there is mild fullness of the ureters most pronounced on the RIGHT and terminating at the pelvic brim. Stomach/Bowel: Post drain placement into RIGHT lower pelvic collections both anterior and posterior with decompression of these collections. Gastrointestinal tract is opacified with ingested contrast media to level of the terminal ileum. There are complex interloop collections which show further organization and there is also a collection along the RIGHT flank that shows peripheral enhancement previously not well organized. (Image 56/3) 8 x 3.5 cm as compared to 6.4 x 2.4 cm and containing gas just below the RIGHT hemiliver. Central abdominal collections within the mesentery of the small bowel, interloop fluid with more organized appearance also containing gas and better organized than on previous imaging mild increase in volume, as measured at the root of the small bowel mesentery at 10 x 5.8 cm as compared to 9.1 x 4.2 cm. With of the central of 3 ax tensions from this collection at the root of the mesentery extending into the mesenteric reflections at 4.3 cm (image 57/3) previously 3.2 cm. No new collections. Vascular/Lymphatic: Aortic atherosclerosis. No sign of aneurysm. Smooth contour of the IVC. There is no gastrohepatic or hepatoduodenal ligament lymphadenopathy. No retroperitoneal or mesenteric lymphadenopathy. No pelvic sidewall lymphadenopathy. Reproductive: Assessment of reproductive structures is limited due to extensive inflammation in the pelvis, no gross change. Stranding in the adnexa bilaterally related to presumed colonic perforation. Other: No pneumoperitoneum. Collections even once containing gas appear contained but are quite large. Musculoskeletal: Body wall edema.  No abdominal wall collection. IMPRESSION: 1. Post  drain placement into RIGHT lower pelvic collections both anterior and posterior with decompression of these collections. 2. Complex interloop and RIGHT flank collection with increased size and further organization/walled-off appearance compatible with multiple intra-abdominal abscesses, some within the mesentery of the small bowel. 3. No pneumoperitoneum. 4. Basilar airspace disease with increasing consolidation at the RIGHT greater than LEFT lung base, findings are concerning for developing infection in the chest though could reflect volume loss, attention on follow-up. 5. Mild distension of the RIGHT slightly greater than LEFT ureter could reflect volume status though given the presence of extensive inflammation at the sacral promontory near the distal ureters would suggest close attention on follow-up to ensure no developing obstruction. 6. Small bilateral effusions also RIGHT greater than LEFT. 7. Aortic atherosclerosis. Aortic Atherosclerosis (ICD10-I70.0). Electronically Signed   By: Donzetta KohutGeoffrey  Wile M.D.   On: 12/06/2021 13:37  ? ?CT IMAGE GUIDED DRAINAGE BY PERCUTANEOUS CATHETER ? ?Result Date: 12/07/2021 ?INDICATION: Briefly, 61 year old female with multiple diverticular abscesses s/p previous drain placement with persistent inferior hepatic collection. EXAM: CT GUIDED DRAINAGE CATHETER PLACEMENT INTO RIGHT LOWER QUADRANT ABSCESS RADIATION DOSE REDUCTION: This exam was performed according to the departmental dose-optimization program which includes automated exposure control, adjustment of the mA and/or kV according to patient size  and/or use of iterative reconstruction technique. COMPARISON:  CT AP, 12/05/2021.  IR CT, 11/28/2021. MEDICATIONS: The patient is currently admitted to the hospital and receiving intravenous antibiotics. The antibiotics were administered within an appropriate time frame prior to the initiation of the procedure. ANESTHESIA/SEDATION: Moderate (conscious) sedation was employed during  this procedure. A total of Versed 2 mg and Fentanyl 100 mcg was administered intravenously. Moderate Sedation Time: 23 minutes. The patient's level of consciousness and vital signs were monitored continuously b

## 2021-12-08 NOTE — Progress Notes (Signed)
Physical Therapy Treatment ?Patient Details ?Name: Judith Miller ?MRN: [id number] ?DOB: September 10, 1960 ?Today's Date: 12/08/2021 ? ? ?History of Present Illness 61 y.o. female presents to [facility] hospital 11/27/2021 as a transfer from [facility] with perforated sigmoid colon. Pt underwent CT guided pelvic drain placement on 3/26. PMH includes bipolar disorder. ? ?  ?PT Comments  ? ? Pt received supine, requesting to ambulate to BR. Pt requiring grossly min assist this session secondary to increased pain s/p drain placement. Pt with x2 LOB needing min assist to correct upon standing from EOB, and requiring hands on assist during short bout of ambulation in room to steady. Ambulation distance limited secondary to increased c/o abdominal pain, despite premedication, cues needed for upright posture with pt able to correct but unable to maintain. Encouraged pt to continue with MT today, pt in agreement. Pt continues to benefit from skilled PT services to progress toward functional mobility goals.  ?  ?Recommendations for follow up therapy are one component of a multi-disciplinary discharge planning process, led by the attending physician.  Recommendations may be updated based on patient status, additional functional criteria and insurance authorization. ? ?Follow Up Recommendations ? Home health PT ?  ?  ?Assistance Recommended at Discharge Intermittent Supervision/Assistance  ?Patient can return home with the following [name];[name];Assist for transportation;[name];Assistance with cooking/housework ?  ?Equipment Recommendations ? Rolling walker (2 wheels)  ?  ?Recommendations for Other Services   ? ? ?  ?Precautions / Restrictions Precautions ?Precautions: Fall ?Precaution Comments: JP drain x 3 ?Restrictions ?Weight Bearing Restrictions: No  ?  ? ?Mobility ? Bed Mobility ?Overal bed mobility: Needs Assistance ?Bed  Mobility: Supine to Sit, Sit to Supine ?  ?  ?Supine to sit: Min guard ?Sit to supine: Min assist ?  ?General bed mobility comments: min assist for LE's back to bed ?  ? ?Transfers ?Overall transfer level: Needs assistance ?Equipment used: None ?Transfers: Sit to/from Stand ?Sit to Stand: Min guard, Min assist ?  ?  ?  ?  ?  ?General transfer comment: min a to steady on rise as pt with x2 mild LOB upon standing requring min a to correct ?  ? ?Ambulation/Gait ?Ambulation/Gait assistance: Min guard, Min assist ?Gait Distance (Feet): 40 Feet ?Assistive device: None ?Gait Pattern/deviations: Step-through pattern, Trunk flexed ?Gait velocity: reduced ?  ?  ?General Gait Details: slow unstable gait needing hands on assist to steady, guarding abdomen with flexed posture, distance limited to increased adbomonal pain s/p drain placement ? ? ?Stairs ?  ?  ?  ?  ?  ? ? ?Wheelchair Mobility ?  ? ?Modified Rankin (Stroke Patients Only) ?  ? ? ?  ?Balance Overall balance assessment: Needs assistance ?Sitting-balance support: No upper extremity supported, Feet supported ?Sitting balance-Leahy Scale: Good ?  ?  ?Standing balance support: Single extremity supported, No upper extremity supported ?Standing balance-Leahy Scale: Poor ?Standing balance comment: minG-minA ?  ?  ?  ?  ?  ?  ?  ?  ?  ?  ?  ?  ? ?  ?Cognition Arousal/Alertness: Awake/alert ?Behavior During Therapy: [facility] for tasks assessed/performed ?Overall Cognitive Status: Within Functional Limits for tasks assessed ?  ?  ?  ?  ?  ?  ?  ?  ?  ?  ?  ?  ?  ?  ?  ?  ?  ?  ?  ? ?  ?  Exercises   ? ?  ?General Comments   ?  ?  ? ?Pertinent Vitals/Pain Pain Assessment ?Pain Assessment: Faces ?Faces Pain Scale: Hurts even more ?Pain Location: abdomen, buttocks ?Pain Descriptors / Indicators: Grimacing, Aching ?Pain Intervention(s): Limited activity within patient's tolerance, Monitored during session, Premedicated before session, Repositioned  ? ? ?Home Living   ?  ?  ?  ?  ?  ?  ?   ?  ?  ?   ?  ?Prior Function    ?  ?  ?   ? ?PT Goals (current goals can now be found in the care plan section) Acute Rehab PT Goals ?Patient Stated Goal: to return to independence and reduce pain ?PT Goal Formulation: With patient ?Time For Goal Achievement: 12/15/21 ? ?  ?Frequency ? ? ? Min 3X/week ? ? ? ?  ?PT Plan    ? ? ?Co-evaluation   ?  ?  ?  ?  ? ?  ?AM-PAC PT "6 Clicks" Mobility   ?Outcome Measure ? Help needed turning from your back to your side while in a flat bed without using bedrails?: A Little ?Help needed moving from lying on your back to sitting on the side of a flat bed without using bedrails?: A Little ?Help needed moving to and from a bed to a chair (including a wheelchair)?: A Little ?Help needed standing up from a chair using your arms (e.g., wheelchair or bedside chair)?: A Little ?Help needed to walk in hospital room?: A Little ?Help needed climbing 3-5 steps with a railing? : Total ?6 Click Score: 16 ? ?  ?End of Session   ?Activity Tolerance: Patient tolerated treatment well ?Patient left: in bed;with call bell/phone within reach;with bed alarm set ?Nurse Communication: Mobility status ?PT Visit Diagnosis: Other abnormalities of gait and mobility (R26.89);Muscle weakness (generalized) (M62.81);Pain ?Pain - part of body:  (abdomen) ?  ? ? ?Time: 4098-1191 ?PT Time Calculation (min) (ACUTE ONLY): 13 min ? ?Charges:  $Gait Training: 8-22 mins          ?          ? ?Lenora Boys. PTA ?Acute Rehabilitation Services ?Office: [phone] ? ? ? ?Marlana Salvage Luiz Trumpower ?12/08/2021, 9:37 AM ? ?

## 2021-12-08 NOTE — Progress Notes (Signed)
Mobility Specialist Progress Note: ? ? 12/08/21 1300  ?Mobility  ?Activity Ambulated with assistance in hallway  ?Level of Assistance Contact guard assist, steadying assist  ?Assistive Device None  ?Distance Ambulated (ft) 550 ft  ?Activity Response Tolerated well  ?$Mobility charge 1 Mobility  ? ?Pt received in chair willing to participate in mobility. Complaints of pain at drain sites. Left in chair with call bell in reach and all needs met.  ? ?Judith Miller ?Mobility Specialist ?Primary Phone [phone] ? ?

## 2021-12-09 LAB — CBC
HCT: 26 % — ABNORMAL LOW (ref 36.0–46.0)
Hemoglobin: 8.7 g/dL — ABNORMAL LOW (ref 12.0–15.0)
MCH: 28.3 pg (ref 26.0–34.0)
MCHC: 33.5 g/dL (ref 30.0–36.0)
MCV: 84.7 fL (ref 80.0–100.0)
Platelets: 536 10*3/uL — ABNORMAL HIGH (ref 150–400)
RBC: 3.07 MIL/uL — ABNORMAL LOW (ref 3.87–5.11)
RDW: 14.9 % (ref 11.5–15.5)
WBC: 11 10*3/uL — ABNORMAL HIGH (ref 4.0–10.5)
nRBC: 0 % (ref 0.0–0.2)

## 2021-12-09 LAB — AEROBIC/ANAEROBIC CULTURE W GRAM STAIN (SURGICAL/DEEP WOUND)

## 2021-12-09 MED ORDER — SODIUM CHLORIDE 0.9% FLUSH
3.0000 mL | INTRAVENOUS | Status: DC | PRN
  Administered 2021-12-09 – 2021-12-24 (×3): 3 mL via INTRAVENOUS

## 2021-12-09 MED ORDER — SODIUM CHLORIDE 0.9% FLUSH
3.0000 mL | Freq: Two times a day (BID) | INTRAVENOUS | Status: DC
  Administered 2021-12-09 – 2021-12-28 (×31): 3 mL via INTRAVENOUS

## 2021-12-09 MED ORDER — SODIUM CHLORIDE 0.9 % IV SOLN: 250.0000 mL | INTRAVENOUS | Status: DC | PRN

## 2021-12-09 NOTE — Progress Notes (Signed)
Mobility Specialist Progress Note: ? ? 12/09/21 1303  ?Mobility  ?Activity Ambulated with assistance in hallway  ?Level of Assistance Independent after set-up  ?Assistive Device None  ?Distance Ambulated (ft) 350 ft  ?Activity Response Tolerated well  ?$Mobility charge 1 Mobility  ? ?Pt received getting back to bed willing to participate in mobility. Complaints of pain at JP drain site. Left in bed with call bell in reach and all needs met.  ? ?Treva Huyett ?Mobility Specialist ?Primary Phone [phone] ? ?

## 2021-12-09 NOTE — Progress Notes (Addendum)
? ?  Subjective/Chief Complaint: ?Reports stable abd pain a new drain site. Tolerating SOFT diet and having small BMs, non-bloody and no mucus. Passing flatus. Denies n/v. She is taking pain medication ~q4h.  ?Afebrile, vitals stable ? ?Objective: ?Vital signs in last 24 hours: ?Temp:  [98 ?F (36.7 ?C)-99.6 ?F (37.6 ?C)] 98 ?F (36.7 ?C) (04/06 0755) ?Pulse Rate:  [73-97] 97 (04/06 0755) ?Resp:  [17-18] 18 (04/06 0755) ?BP: (122-150)/(55-70) 150/70 (04/06 0755) ?SpO2:  [91 %-96 %] 91 % (04/06 0755) ?Last BM Date : 12/09/21 ? ?Intake/Output from previous day: ?04/05 0701 - 04/06 0700 ?In: 1868.9 [I.V.:1723.9; IV Piggyback:99.9] ?Out: 105 [Drains:105] ?Intake/Output this shift: ?Total I/O ?In: 460 [P.O.:460] ?Out: -  ? ? ?PE: ?Constitutional: No acute distress, conversant ?Lungs: normal respiratory effort ?CV: regular rate and rhythm  ?GI: Soft, ttp at superior anterior abdominal drain site; TG drain purulent, superior anterior drain with serous fluid today, inferior anterior drain with scant serous fluid  ?Skin: No rashes, palpation reveals normal turgor ?Psychiatric: appropriate judgment and insight, oriented to person, place, and time ? ?Lab Results:  ?Recent Labs  ?  12/08/21 ?0234 12/09/21 ?0926  ?WBC 10.6* 11.0*  ?HGB 8.8* 8.7*  ?HCT 25.7* 26.0*  ?PLT 529* 536*  ? ? ?BMET ?Recent Labs  ?  12/08/21 ?0234  ?NA 135  ?K 4.0  ?CL 100  ?CO2 29  ?GLUCOSE 87  ?BUN 8  ?CREATININE 0.48  ?CALCIUM 8.8*  ? ? ?PT/INR ?No results for input(s): LABPROT, INR in the last 72 hours. ?ABG ?No results for input(s): PHART, HCO3 in the last 72 hours. ? ?Invalid input(s): PCO2, PO2 ? ?Studies/Results: ?No results found. ? ?Anti-infectives: ?Anti-infectives (From admission, onward)  ? ? Start     Dose/Rate Route Frequency Ordered Stop  ? 12/08/21 1500  piperacillin-tazobactam (ZOSYN) IVPB 3.375 g       ? 3.375 g ?12.5 mL/hr over 240 Minutes Intravenous Every 8 hours 12/08/21 1414    ? 11/27/21 2300  piperacillin-tazobactam (ZOSYN) IVPB  3.375 g       ? 3.375 g ?12.5 mL/hr over 240 Minutes Intravenous Every 8 hours 11/27/21 2202 12/04/21 2025  ? ?  ? ? ?Assessment/Plan: ?Sigmoid diverticulitis with pelvic abscesses ?S/P IR drainage x 2 3/26.  ?CT 4/2: decompression of previously drained collections, complex R flank collection increased in size; s/p IR drainage 4/4 ?- afebrile, VSS, WBC 11 from 10.6 ?- con't SOFT diet and monitor ?- Cxs from 3/26 with klebsiella/pseudomonas/enterococcus; Cx from 4/4 with E.coli/Klebsiella/Strep constellatus - currently on IV zosyn ?- OOB and mobilize  ?- continue to closely monitor; no emergent indications for surgery today. Seems to have adequate source control for now with perc drains and Abx. Possibly transition to PO abx today pending discussion with attending MD.  ? ?FEN: Soft, SLIV ?ID: Zosyn 3/25>> day 12 ?VTE: SCD's, lovenox ?Foley: none ? ?Small bilateral pleural effusions with atelectasis vs consolidation  ?Bipolar disorder, home meds.  ? LOS: 12 days  ? ?I reviewed vitals, labs, imaging from last 24 hours.  ? ?Juliet Rude, PA-C ?[facility] ?12/09/2021, 12:04 PM ?Please see Amion for pager number during day hours 7:00am-4:30pm ? ? ?

## 2021-12-09 NOTE — Progress Notes (Signed)
Mobility Specialist Progress Note: ? ? 12/09/21 0959  ?Mobility  ?Activity Ambulated with assistance in room;Ambulated with assistance to bathroom  ?Level of Assistance Standby assist, set-up cues, supervision of patient - no hands on  ?Assistive Device None  ?Distance Ambulated (ft) 40 ft  ?Activity Response Tolerated well  ?$Mobility charge 1 Mobility  ? ?Pt received in bed requesting to go to the bathroom. No complaints of pain. Left EOB with call bell in reach and all needs met.  ? ?Keymani Mclean ?Mobility Specialist ?Primary Phone [phone] ? ?

## 2021-12-10 LAB — CBC
HCT: 25.3 % — ABNORMAL LOW (ref 36.0–46.0)
Hemoglobin: 8.4 g/dL — ABNORMAL LOW (ref 12.0–15.0)
MCH: 28.6 pg (ref 26.0–34.0)
MCHC: 33.2 g/dL (ref 30.0–36.0)
MCV: 86.1 fL (ref 80.0–100.0)
Platelets: 551 10*3/uL — ABNORMAL HIGH (ref 150–400)
RBC: 2.94 MIL/uL — ABNORMAL LOW (ref 3.87–5.11)
RDW: 14.9 % (ref 11.5–15.5)
WBC: 11.7 10*3/uL — ABNORMAL HIGH (ref 4.0–10.5)
nRBC: 0 % (ref 0.0–0.2)

## 2021-12-10 LAB — BASIC METABOLIC PANEL
Anion gap: 5 (ref 5–15)
BUN: 8 mg/dL (ref 6–20)
CO2: 30 mmol/L (ref 22–32)
Calcium: 8.5 mg/dL — ABNORMAL LOW (ref 8.9–10.3)
Chloride: 101 mmol/L (ref 98–111)
Creatinine, Ser: 0.53 mg/dL (ref 0.44–1.00)
GFR, Estimated: >60 mL/min (ref >60)
Glucose, Bld: 96 mg/dL (ref 70–99)
Potassium: 3.8 mmol/L (ref 3.5–5.1)
Sodium: 136 mmol/L (ref 135–145)

## 2021-12-10 NOTE — Progress Notes (Signed)
Patient ID: Judith Miller, female   DOB: [birth year], 61 y.o.   MRN: [id number] ?[facility] Progress Note:   * No surgery found *  ?Subjective: ?Mental status is clear.  Complaints Few-up walking and having some discomfort from drain sites. ?Objective: ?Vital signs in last 24 hours: ?Temp:  [97.6 ?F (36.4 ?C)-98.8 ?F (37.1 ?C)] 98.8 ?F (37.1 ?C) (04/07 5573) ?Pulse Rate:  [74-93] 84 (04/07 0739) ?Resp:  [16-18] 18 (04/07 0739) ?BP: (108-129)/(44-66) 108/66 (04/07 0739) ?SpO2:  [82 %-98 %] 90 % (04/07 0739) ? ?Intake/Output from previous day: ?04/06 0701 - 04/07 0700 ?In: 1211.1 [P.O.:1060; IV Piggyback:151.1] ?Out: 90 [Drains:90] ?Intake/Output this shift: ?Total I/O ?In: 220 [P.O.:220] ?Out: -  ? ?Physical Exam: Work of breathing is normal.  Drains in place ? ?Lab Results:  ?Results for orders placed or performed during the hospital encounter of 11/27/21 (from the past 48 hour(s))  ?CBC     Status: Abnormal  ? Collection Time: 12/09/21  9:26 AM  ?Result Value Ref Range  ? WBC 11.0 (H) 4.0 - 10.5 K/uL  ? RBC 3.07 (L) 3.87 - 5.11 MIL/uL  ? Hemoglobin 8.7 (L) 12.0 - 15.0 g/dL  ? HCT 26.0 (L) 36.0 - 46.0 %  ? MCV 84.7 80.0 - 100.0 fL  ? MCH 28.3 26.0 - 34.0 pg  ? MCHC 33.5 30.0 - 36.0 g/dL  ? RDW 14.9 11.5 - 15.5 %  ? Platelets 536 (H) 150 - 400 K/uL  ? nRBC 0.0 0.0 - 0.2 %  ?  Comment: Performed at [facility], [address] [place], [place] [address]  ?CBC     Status: Abnormal  ? Collection Time: 12/10/21 12:46 AM  ?Result Value Ref Range  ? WBC 11.7 (H) 4.0 - 10.5 K/uL  ? RBC 2.94 (L) 3.87 - 5.11 MIL/uL  ? Hemoglobin 8.4 (L) 12.0 - 15.0 g/dL  ? HCT 25.3 (L) 36.0 - 46.0 %  ? MCV 86.1 80.0 - 100.0 fL  ? MCH 28.6 26.0 - 34.0 pg  ? MCHC 33.2 30.0 - 36.0 g/dL  ? RDW 14.9 11.5 - 15.5 %  ? Platelets 551 (H) 150 - 400 K/uL  ? nRBC 0.0 0.0 - 0.2 %  ?  Comment: Performed at [facility], [address] [place], [place] [address]  ?Basic metabolic panel     Status: Abnormal  ? Collection Time:  12/10/21 12:46 AM  ?Result Value Ref Range  ? Sodium 136 135 - 145 mmol/L  ? Potassium 3.8 3.5 - 5.1 mmol/L  ? Chloride 101 98 - 111 mmol/L  ? CO2 30 22 - 32 mmol/L  ? Glucose, Bld 96 70 - 99 mg/dL  ?  Comment: Glucose reference range applies only to samples taken after fasting for at least 8 hours.  ? BUN 8 6 - 20 mg/dL  ? Creatinine, Ser 0.53 0.44 - 1.00 mg/dL  ? Calcium 8.5 (L) 8.9 - 10.3 mg/dL  ? GFR, Estimated >60 >60 mL/min  ?  Comment: (NOTE) ?Calculated using the CKD-EPI Creatinine Equation (2021) ?  ? Anion gap 5 5 - 15  ?  Comment: Performed at [facility], [address] [place], [place] [address]  ? ? ?Radiology/Results: ?No results found. ? ?Anti-infectives: ?Anti-infectives (From admission, onward)  ? ? Start     Dose/Rate Route Frequency Ordered Stop  ? 12/08/21 1500  piperacillin-tazobactam (ZOSYN) IVPB 3.375 g       ? 3.375 g ?  12.5 mL/hr over 240 Minutes Intravenous Every 8 hours 12/08/21 1414    ? 11/27/21 2300  piperacillin-tazobactam (ZOSYN) IVPB 3.375 g       ? 3.375 g ?12.5 mL/hr over 240 Minutes Intravenous Every 8 hours 11/27/21 2202 12/04/21 2025  ? ?  ? ? ?Assessment/Plan: ?Problem List: ?Patient Active Problem List  ? Diagnosis Date Noted  ? Perforation of sigmoid colon (HCC) 11/27/2021  ? ? ?Doing well.  Continue observation with drains in place ?* No surgery found *  ? ? LOS: 13 days  ? ?Matt B. Daphine Deutscher, MD, FACS ? ?[facility] ?[phone] to reach the surgeon on call.   ? ?12/10/2021 8:43 AM  ?

## 2021-12-10 NOTE — Progress Notes (Signed)
? ? ?Supervising Physician: Malachy Moan ? ?Patient Status:  [facility] - In-pt ? ?Chief Complaint: ? ?Pelvic fluid collection with sigmoid perforation, s/p L TG and RLQ drain placement by Dr. Bryn Gulling on 3/26.  ?Additional 14 fr RLQ (exits at RUQ) drain placement on 4/4 with Dr. Milford Cage. ? ?Subjective: ? ?Patient laying in bed, NAD.  ?Reports soreness on RUQ, improved from prior, no chills,fever, or N/V.  ? ? ?Allergies: ?Sulfa antibiotics ? ?Medications: ?Prior to Admission medications   ?Medication Sig Start Date End Date Taking? Authorizing Provider  ?risperiDONE (RISPERDAL) 0.5 MG tablet Take 0.5 mg by mouth in the morning.   Yes [provider]  ?risperiDONE (RISPERDAL) 1 MG tablet Take 1.5 mg by mouth at bedtime.   Yes [provider]  ?vitamin B-12 (CYANOCOBALAMIN) 500 MCG tablet Take 500 mcg by mouth daily.   Yes [provider]  ? ? ? ?Vital Signs: ?BP 108/66 (BP Location: Left Arm)   Pulse 84   Temp 98.8 ?F (37.1 ?C) (Oral)   Resp 18   Ht 5\' 4"  (1.626 m)   Wt 182 lb (82.6 kg)   SpO2 90%   BMI 31.24 kg/m?  ? ?Physical Exam ?Constitutional:   ?   General: She is not in acute distress. ?   Appearance: She is ill-appearing.  ?HENT:  ?   Head: Normocephalic and atraumatic.  ?   Mouth/Throat:  ?   Pharynx: Oropharynx is clear.  ?Eyes:  ?   Extraocular Movements: Extraocular movements intact.  ?Pulmonary:  ?   Effort: Pulmonary effort is normal. No respiratory distress.  ?Abdominal:  ?   General: Abdomen is flat.  ?   Palpations: Abdomen is soft.  ?Skin: ?   General: Skin is warm and dry.  ?   Comments: Drain #1. Positive RLQ drain to suction bulb. Site is unremarkable with no erythema, edema, tenderness, bleeding or drainage. Dressing is clean, dry, and intact. 5 ml of clear fluid noted in the bulb.   ?  ?Drain # 2. Positive left TG drain to suction bulb. Site is unremarkable with no erythema, edema, tenderness, bleeding or drainage. Dressing is clean, dry, and intact. 20 ml of   purulent fluid noted in the bulb.  ?  ?Drain #3. Positive RUQ drain to a suction bulb. Site is unremarkable with no erythema, edema, bleeding or drainage.  Site tender to palpation Suture and stat lock in place. Dressing is clean, dry, and intact. 5 ml of clear fluid noted in the bulb.   ?Neurological:  ?   General: No focal deficit present.  ?   Mental Status: She is alert.  ?Psychiatric:     ?   Mood and Affect: Mood normal.     ?   Behavior: Behavior normal.  ? ? ?Imaging: ?CT ABDOMEN PELVIS W CONTRAST ? ?Result Date: 12/06/2021 ?CLINICAL DATA:  A 61 year old female presents for evaluation of complication of diverticulitis. EXAM: CT ABDOMEN AND PELVIS WITH CONTRAST TECHNIQUE: Multidetector CT imaging of the abdomen and pelvis was performed using the standard protocol following bolus administration of intravenous contrast. RADIATION DOSE REDUCTION: This exam was performed according to the departmental dose-optimization program which includes automated exposure control, adjustment of the mA and/or kV according to patient size and/or use of iterative reconstruction technique. CONTRAST:  100 mL Omnipaque 300. COMPARISON:  Comparison exam from November 26, 2021 FINDINGS: Lower chest: Basilar airspace disease with increasing consolidation at the RIGHT greater than LEFT lung base. Small bilateral effusions also  RIGHT greater than LEFT. Hepatobiliary: Gallbladder is collapsed. No biliary duct distension. Portal vein is patent. Lenticular subcapsular collection along the RIGHT hemiliver (image 29/3) this measures 4.2 x 2.5 cm as compared to 5.0 x 2.1 cm. Appears more organized than on the previous study. No intrahepatic collection. Pancreas: Normal, without mass, inflammation or ductal dilatation. Spleen: Normal. Adrenals/Urinary Tract: Adrenal glands are normal. Symmetric renal enhancement. No hydronephrosis though there is mild fullness of the ureters most pronounced on the RIGHT and terminating at the pelvic brim.  Stomach/Bowel: Post drain placement into RIGHT lower pelvic collections both anterior and posterior with decompression of these collections. Gastrointestinal tract is opacified with ingested contrast media to level of the terminal ileum. There are complex interloop collections which show further organization and there is also a collection along the RIGHT flank that shows peripheral enhancement previously not well organized. (Image 56/3) 8 x 3.5 cm as compared to 6.4 x 2.4 cm and containing gas just below the RIGHT hemiliver. Central abdominal collections within the mesentery of the small bowel, interloop fluid with more organized appearance also containing gas and better organized than on previous imaging mild increase in volume, as measured at the root of the small bowel mesentery at 10 x 5.8 cm as compared to 9.1 x 4.2 cm. With of the central of 3 ax tensions from this collection at the root of the mesentery extending into the mesenteric reflections at 4.3 cm (image 57/3) previously 3.2 cm. No new collections. Vascular/Lymphatic: Aortic atherosclerosis. No sign of aneurysm. Smooth contour of the IVC. There is no gastrohepatic or hepatoduodenal ligament lymphadenopathy. No retroperitoneal or mesenteric lymphadenopathy. No pelvic sidewall lymphadenopathy. Reproductive: Assessment of reproductive structures is limited due to extensive inflammation in the pelvis, no gross change. Stranding in the adnexa bilaterally related to presumed colonic perforation. Other: No pneumoperitoneum. Collections even once containing gas appear contained but are quite large. Musculoskeletal: Body wall edema.  No abdominal wall collection. IMPRESSION: 1. Post drain placement into RIGHT lower pelvic collections both anterior and posterior with decompression of these collections. 2. Complex interloop and RIGHT flank collection with increased size and further organization/walled-off appearance compatible with multiple intra-abdominal  abscesses, some within the mesentery of the small bowel. 3. No pneumoperitoneum. 4. Basilar airspace disease with increasing consolidation at the RIGHT greater than LEFT lung base, findings are concerning for developing infection in the chest though could reflect volume loss, attention on follow-up. 5. Mild distension of the RIGHT slightly greater than LEFT ureter could reflect volume status though given the presence of extensive inflammation at the sacral promontory near the distal ureters would suggest close attention on follow-up to ensure no developing obstruction. 6. Small bilateral effusions also RIGHT greater than LEFT. 7. Aortic atherosclerosis. Aortic Atherosclerosis (ICD10-I70.0). Electronically Signed   By: Donzetta KohutGeoffrey  Wile M.D.   On: 12/06/2021 13:37  ? ?CT IMAGE GUIDED DRAINAGE BY PERCUTANEOUS CATHETER ? ?Result Date: 12/07/2021 ?INDICATION: Briefly, 61 year old female with multiple diverticular abscesses s/p previous drain placement with persistent inferior hepatic collection. EXAM: CT GUIDED DRAINAGE CATHETER PLACEMENT INTO RIGHT LOWER QUADRANT ABSCESS RADIATION DOSE REDUCTION: This exam was performed according to the departmental dose-optimization program which includes automated exposure control, adjustment of the mA and/or kV according to patient size and/or use of iterative reconstruction technique. COMPARISON:  CT AP, 12/05/2021.  IR CT, 11/28/2021. MEDICATIONS: The patient is currently admitted to the hospital and receiving intravenous antibiotics. The antibiotics were administered within an appropriate time frame prior to the initiation of the procedure. ANESTHESIA/SEDATION:  Moderate (conscious) sedation was employed during this procedure. A total of Versed 2 mg and Fentanyl 100 mcg was administered intravenously. Moderate Sedation Time: 23 minutes. The patient's level of consciousness and vital signs were monitored continuously by radiology nursing throughout the procedure under my direct  supervision. CONTRAST:  None COMPLICATIONS: None immediate. PROCEDURE: Informed written consent was obtained from the patient after a discussion of the risks, benefits and alternatives to treatment. The patient was placed supine on th

## 2021-12-10 NOTE — Progress Notes (Signed)
Mobility Specialist Progress Note  ? ? 12/10/21 1014  ?Mobility  ?Activity Ambulated independently in hallway  ?Level of Assistance Modified independent, requires aide device or extra time  ?Assistive Device Other (Comment) ?(IV pole)  ?Distance Ambulated (ft) 305 ft  ?Activity Response Tolerated well  ?$Mobility charge 1 Mobility  ? ?Pt received in bed and agreeable. C/o 4/10 at rest and said it stayed about the same with activity. Returned to bed with call bell in reach and RN present.  ? ?[place] [facility] ?Mobility Specialist  ?  ?

## 2021-12-10 NOTE — Progress Notes (Signed)
Physical Therapy Treatment ?Patient Details ?Name: Judith Miller ?MRN: [id number] ?DOB: [birth year] ?Today's Date: 12/10/2021 ? ? ?History of Present Illness 61 y.o. female presents to [facility] hospital 11/27/2021 as a transfer from [facility] with perforated sigmoid colon. Pt underwent CT guided pelvic drain placement on 3/26. PMH includes bipolar disorder. ? ?  ?PT Comments  ? ? Pt was seen for mobility on bed with rolling and then to do strengthening and ROM to hips and legs.  Pt is limited by recent disuse, lamenting having dropped out of working out before all this change occurred.  Pt is able to demonstrate competence to do the exercises, and will encourage her to continue with HHPT.  Talked with her about warming up to walk with the exercises, and to use them for recovery of ROM to avoid painful stiffness with hips when walking.  Follow acutely for goals of PT with focus on strength of LE's, standing balance and control of gait quality with the full routine.   ?Recommendations for follow up therapy are one component of a multi-disciplinary discharge planning process, led by the attending physician.  Recommendations may be updated based on patient status, additional functional criteria and insurance authorization. ? ?Follow Up Recommendations ? Home health PT ?  ?  ?Assistance Recommended at Discharge Intermittent Supervision/Assistance  ?Patient can return home with the following [name];[name];Assist for transportation;[name];Assistance with cooking/housework ?  ?Equipment Recommendations ? Rolling walker (2 wheels)  ?  ?Recommendations for Other Services   ? ? ?  ?Precautions / Restrictions Precautions ?Precautions: Fall ?Precaution Comments: JP drain x 3 ?Restrictions ?Weight Bearing Restrictions: No ?Other Position/Activity Restrictions: skin change on sacral area  ?  ? ?Mobility ? Bed Mobility ?Overal bed  mobility: Needs Assistance ?Bed Mobility: Rolling ?Rolling: Supervision ?  ?  ?  ?  ?General bed mobility comments: declines OOB but can roll independently ?  ? ?Transfers ?  ?  ?  ?  ?  ?  ?  ?  ?  ?General transfer comment: declines OOB ?  ? ?Ambulation/Gait ?  ?  ?  ?  ?  ?  ?  ?  ? ? ?Stairs ?  ?  ?  ?  ?  ? ? ?Wheelchair Mobility ?  ? ?Modified Rankin (Stroke Patients Only) ?  ? ? ?  ?Balance   ?  ?  ?  ?  ?  ?  ?  ?  ?  ?  ?  ?  ?  ?  ?  ?  ?  ?  ?  ? ?  ?Cognition Arousal/Alertness: Awake/alert ?Behavior During Therapy: [facility] for tasks assessed/performed ?Overall Cognitive Status: Within Functional Limits for tasks assessed ?  ?  ?  ?  ?  ?  ?  ?  ?  ?  ?  ?  ?  ?  ?  ?  ?  ?  ?  ? ?  ?Exercises General Exercises - Lower Extremity ?Ankle Circles/Pumps: AAROM, AROM, 5 reps ?Quad Sets: AROM, 10 reps ?Gluteal Sets: AROM, 10 reps ?Heel Slides: AROM, 10 reps ?Hip ABduction/ADduction: AROM, 10 reps ? ?  ?General Comments General comments (skin integrity, edema, etc.): pt is motivated to walk but now tired from being up walking, agreed to ex which she is doing with vc's only ?  ?  ? ?Pertinent Vitals/Pain Pain Assessment ?Pain Assessment: Faces ?Faces Pain Scale: Hurts  little more ?Pain Location: abdomen, buttocks ?Pain Descriptors / Indicators: Grimacing, Guarding ?Pain Intervention(s): Limited activity within patient's tolerance, Monitored during session, Premedicated before session, Repositioned  ? ? ?Home Living   ?  ?  ?  ?  ?  ?  ?  ?  ?  ?   ?  ?Prior Function    ?  ?  ?   ? ?PT Goals (current goals can now be found in the care plan section) Acute Rehab PT Goals ?Patient Stated Goal: go back to work ? ?  ?Frequency ? ? ? Min 3X/week ? ? ? ?  ?PT Plan Current plan remains appropriate  ? ? ?Co-evaluation   ?  ?  ?  ?  ? ?  ?AM-PAC PT "6 Clicks" Mobility   ?Outcome Measure ? Help needed turning from your back to your side while in a flat bed without using bedrails?: A Little ?Help needed moving from lying on your  back to sitting on the side of a flat bed without using bedrails?: A Little ?Help needed moving to and from a bed to a chair (including a wheelchair)?: A Little ?Help needed standing up from a chair using your arms (e.g., wheelchair or bedside chair)?: A Little ?Help needed to walk in hospital room?: A Little ?Help needed climbing 3-5 steps with a railing? : A Lot ?6 Click Score: 17 ? ?  ?End of Session   ?Activity Tolerance: Patient tolerated treatment well ?Patient left: in bed;with call bell/phone within reach;with bed alarm set ?Nurse Communication: Mobility status ?PT Visit Diagnosis: Other abnormalities of gait and mobility (R26.89);Muscle weakness (generalized) (M62.81);Pain ?Pain - Right/Left:  (abdomen and sacrum) ?Pain - part of body:  (abdomen and sacrum) ?  ? ? ?Time: 0272-5366 ?PT Time Calculation (min) (ACUTE ONLY): 13 min ? ?Charges:  $Therapeutic Exercise: 8-22 mins ?Ivar Drape ?12/10/2021, 1:02 PM ? ?Samul Dada, PT PhD ?Acute Rehab Dept. Number: [facility] [phone] and [facility] [phone] ? ? ?

## 2021-12-11 LAB — CREATININE, SERUM
Creatinine, Ser: 0.49 mg/dL (ref 0.44–1.00)
GFR, Estimated: >60 mL/min (ref >60)

## 2021-12-11 NOTE — Progress Notes (Signed)
Patient ID: Judith Miller, female   DOB: 1961-03-03, 61 y.o.   MRN: [id number] ?[facility] Progress Note:   * No surgery found *  ?Subjective: ?Mental status is clear.  Complaints urgency to have BM. ?Objective: ?Vital signs in last 24 hours: ?Temp:  [98 ?F (36.7 ?C)-98.8 ?F (37.1 ?C)] 98.8 ?F (37.1 ?C) (04/08 9741) ?Pulse Rate:  [85-103] 93 (04/08 0836) ?Resp:  [18-19] 18 (04/08 0836) ?BP: (125-143)/(56-80) 140/62 (04/08 0836) ?SpO2:  [89 %-92 %] 91 % (04/08 0836) ? ?Intake/Output from previous day: ?04/07 0701 - 04/08 0700 ?In: 235 [P.O.:220] ?Out: 40 [Drains:43] ?Intake/Output this shift: ?No intake/output data recorded. ? ?Physical Exam: Work of breathing is normal.  Pain less with drains in place ? ?Lab Results:  ?Results for orders placed or performed during the hospital encounter of 11/27/21 (from the past 48 hour(s))  ?CBC     Status: Abnormal  ? Collection Time: 12/10/21 12:46 AM  ?Result Value Ref Range  ? WBC 11.7 (H) 4.0 - 10.5 K/uL  ? RBC 2.94 (L) 3.87 - 5.11 MIL/uL  ? Hemoglobin 8.4 (L) 12.0 - 15.0 g/dL  ? HCT 25.3 (L) 36.0 - 46.0 %  ? MCV 86.1 80.0 - 100.0 fL  ? MCH 28.6 26.0 - 34.0 pg  ? MCHC 33.2 30.0 - 36.0 g/dL  ? RDW 14.9 11.5 - 15.5 %  ? Platelets 551 (H) 150 - 400 K/uL  ? nRBC 0.0 0.0 - 0.2 %  ?  Comment: Performed at [facility], [address] [place], [place] [address]  ?Basic metabolic panel     Status: Abnormal  ? Collection Time: 12/10/21 12:46 AM  ?Result Value Ref Range  ? Sodium 136 135 - 145 mmol/L  ? Potassium 3.8 3.5 - 5.1 mmol/L  ? Chloride 101 98 - 111 mmol/L  ? CO2 30 22 - 32 mmol/L  ? Glucose, Bld 96 70 - 99 mg/dL  ?  Comment: Glucose reference range applies only to samples taken after fasting for at least 8 hours.  ? BUN 8 6 - 20 mg/dL  ? Creatinine, Ser 0.53 0.44 - 1.00 mg/dL  ? Calcium 8.5 (L) 8.9 - 10.3 mg/dL  ? GFR, Estimated >60 >60 mL/min  ?  Comment: (NOTE) ?Calculated using the CKD-EPI Creatinine Equation (2021) ?  ? Anion gap 5 5 - 15  ?   Comment: Performed at [facility], [address] [place], [place] [address]  ?Creatinine, serum     Status: None  ? Collection Time: 12/11/21  4:45 AM  ?Result Value Ref Range  ? Creatinine, Ser 0.49 0.44 - 1.00 mg/dL  ? GFR, Estimated >60 >60 mL/min  ?  Comment: (NOTE) ?Calculated using the CKD-EPI Creatinine Equation (2021) ?Performed at [facility], [address], [place], [place] ?[address] ?  ? ? ?Radiology/Results: ?No results found. ? ?Anti-infectives: ?Anti-infectives (From admission, onward)  ? ? Start     Dose/Rate Route Frequency Ordered Stop  ? 12/08/21 1500  piperacillin-tazobactam (ZOSYN) IVPB 3.375 g       ? 3.375 g ?12.5 mL/hr over 240 Minutes Intravenous Every 8 hours 12/08/21 1414    ? 11/27/21 2300  piperacillin-tazobactam (ZOSYN) IVPB 3.375 g       ? 3.375 g ?12.5 mL/hr over 240 Minutes Intravenous Every 8 hours 11/27/21 2202 12/04/21 2025  ? ?  ? ? ?Assessment/Plan: ?Problem List: ?Patient Active Problem List  ? Diagnosis Date Noted  ? Perforation of  sigmoid colon (HCC) 11/27/2021  ? ? ?Improving with perc drains and IV antibiotics ?* No surgery found *  ? ? LOS: 14 days  ? ?Matt B. Daphine Deutscher, MD, FACS ? ?[facility] ?[phone] to reach the surgeon on call.   ? ?12/11/2021 10:48 AM  ?

## 2021-12-12 NOTE — Progress Notes (Signed)
Patient ID: Judith Miller, female   DOB: 1961-04-20, 61 y.o.   MRN: [id number] ?[facility] Progress Note:   * No surgery found *  ?Subjective: ?Mental status is alert.  Complaints not any new complaints except irritation from the right side perc drain site. ?Objective: ?Vital signs in last 24 hours: ?Temp:  [97.9 ?F (36.6 ?C)-99.3 ?F (37.4 ?C)] 97.9 ?F (36.6 ?C) (04/09 4709) ?Pulse Rate:  [81-91] 81 (04/09 0943) ?Resp:  [18] 18 (04/09 0943) ?BP: (111-141)/(52-64) 121/52 (04/09 0943) ?SpO2:  [89 %-93 %] 89 % (04/09 0943) ? ?Intake/Output from previous day: ?04/08 0701 - 04/09 0700 ?In: 1270 [P.O.:1160; IV Piggyback:50] ?Out: 905 [Drains:905] ?Intake/Output this shift: ?Total I/O ?In: -  ?Out: 10 [Drains:10] ? ?Physical Exam: Work of breathing is normal.  Abscess drains with cloudy material.  Abdomen is overall not tender ? ?Lab Results:  ?Results for orders placed or performed during the hospital encounter of 11/27/21 (from the past 48 hour(s))  ?Creatinine, serum     Status: None  ? Collection Time: 12/11/21  4:45 AM  ?Result Value Ref Range  ? Creatinine, Ser 0.49 0.44 - 1.00 mg/dL  ? GFR, Estimated >60 >60 mL/min  ?  Comment: (NOTE) ?Calculated using the CKD-EPI Creatinine Equation (2021) ?Performed at [facility], [address] [place], [place] ?[address] ?  ? ? ?Radiology/Results: ?No results found. ? ?Anti-infectives: ?Anti-infectives (From admission, onward)  ? ? Start     Dose/Rate Route Frequency Ordered Stop  ? 12/08/21 1500  piperacillin-tazobactam (ZOSYN) IVPB 3.375 g       ? 3.375 g ?12.5 mL/hr over 240 Minutes Intravenous Every 8 hours 12/08/21 1414    ? 11/27/21 2300  piperacillin-tazobactam (ZOSYN) IVPB 3.375 g       ? 3.375 g ?12.5 mL/hr over 240 Minutes Intravenous Every 8 hours 11/27/21 2202 12/04/21 2025  ? ?  ? ? ?Assessment/Plan: ?Problem List: ?Patient Active Problem List  ? Diagnosis Date Noted  ? Perforation of sigmoid colon (HCC) 11/27/2021  ? ? ?She appears to have  responded to antibiotics and percutaneous drainage.  Will check CBC in am.   ?* No surgery found *  ? ? LOS: 15 days  ? ?Matt B. Daphine Deutscher, MD, FACS ? ?[facility] ?[phone] to reach the surgeon on call.   ? ?12/12/2021 11:18 AM  ?

## 2021-12-12 NOTE — Plan of Care (Signed)

## 2021-12-13 LAB — CBC WITH DIFFERENTIAL/PLATELET
Abs Immature Granulocytes: 0.16 10*3/uL — ABNORMAL HIGH (ref 0.00–0.07)
Basophils Absolute: 0 10*3/uL (ref 0.0–0.1)
Basophils Relative: 0 %
Eosinophils Absolute: 0.1 10*3/uL (ref 0.0–0.5)
Eosinophils Relative: 0 %
HCT: 24.2 % — ABNORMAL LOW (ref 36.0–46.0)
Hemoglobin: 7.8 g/dL — ABNORMAL LOW (ref 12.0–15.0)
Immature Granulocytes: 1 %
Lymphocytes Relative: 12 %
Lymphs Abs: 2.2 10*3/uL (ref 0.7–4.0)
MCH: 27.5 pg (ref 26.0–34.0)
MCHC: 32.2 g/dL (ref 30.0–36.0)
MCV: 85.2 fL (ref 80.0–100.0)
Monocytes Absolute: 1.5 10*3/uL — ABNORMAL HIGH (ref 0.1–1.0)
Monocytes Relative: 8 %
Neutro Abs: 14.3 10*3/uL — ABNORMAL HIGH (ref 1.7–7.7)
Neutrophils Relative %: 79 %
Platelets: 539 10*3/uL — ABNORMAL HIGH (ref 150–400)
RBC: 2.84 MIL/uL — ABNORMAL LOW (ref 3.87–5.11)
RDW: 14.7 % (ref 11.5–15.5)
WBC: 18.2 10*3/uL — ABNORMAL HIGH (ref 4.0–10.5)
nRBC: 0 % (ref 0.0–0.2)

## 2021-12-13 NOTE — Progress Notes (Signed)
Mobility Specialist Progress Note: ? ? 12/13/21 1630  ?Mobility  ?Activity Ambulated with assistance in hallway  ?Level of Assistance Independent after set-up  ?Assistive Device None  ?Distance Ambulated (ft) 570 ft  ?Activity Response Tolerated well  ?$Mobility charge 1 Mobility  ? ?Pt received EOB willing to participate in mobility. Complaints of low abdomen pain and pain at the drain site. Left in bed with call bell in reach and all needs met.  ? ?Zailyn Thoennes ?Mobility Specialist ?Primary Phone [phone] ? ?

## 2021-12-13 NOTE — Progress Notes (Signed)
? ? ?Referring Physician(s): ?Sophronia Simas, MD ? ?Supervising Physician: Pernell Dupre ? ?Patient Status:  [facility] - In-pt ? ?Chief Complaint: ? ?Pelvic fluid collection with sigmoid perforation, s/p L TG and RLQ drain placement by Dr. Bryn Gulling on 3/26.  ?Additional 14 fr RLQ (exits at RUQ) drain placement on 4/4 with Dr. Milford Cage. ? ?Subjective: ? ?Pt walking around room pushing IV pole. She states she is feeling some better. Pt endorses tenderness to insertion site of drain 1. ? ?Allergies: ?Sulfa antibiotics ? ?Medications: ?Prior to Admission medications   ?Medication Sig Start Date End Date Taking? Authorizing Provider  ?risperiDONE (RISPERDAL) 0.5 MG tablet Take 0.5 mg by mouth in the morning.   Yes [provider]  ?risperiDONE (RISPERDAL) 1 MG tablet Take 1.5 mg by mouth at bedtime.   Yes [provider]  ?vitamin B-12 (CYANOCOBALAMIN) 500 MCG tablet Take 500 mcg by mouth daily.   Yes [provider]  ? ? ? ?Vital Signs: ?BP 120/61 (BP Location: Left Arm)   Pulse 79   Temp 97.9 ?F (36.6 ?C) (Oral)   Resp 19   Ht 5\' 4"  (1.626 m)   Wt 182 lb (82.6 kg)   SpO2 (!) 89%   BMI 31.24 kg/m?  ? ?Physical Exam ?Constitutional:   ?   General: She is not in acute distress. ?   Appearance: She is ill-appearing.  ?HENT:  ?   Head: Normocephalic and atraumatic.  ?Eyes:  ?   Extraocular Movements: Extraocular movements intact.  ?   Pupils: Pupils are equal, round, and reactive to light.  ?Pulmonary:  ?   Effort: Pulmonary effort is normal. No respiratory distress.  ?Skin: ?   General: Skin is warm and dry.  ?   Comments: Comments: Drain #1. Positive RLQ drain to suction bulb. Site is unremarkable with no erythema, edema, tenderness, bleeding or drainage. Dressing is clean, dry, and intact. ~5 cc of clear, pink tinged OP in JP drain.  ?  ?Drain # 2. Positive left TG drain to suction bulb. Site is unremarkable with no erythema, edema or bleeding. Pt endorses tenderness to site. Small amt purulent  drainage to dressing. RN to change. ~5 cc of  purulent, tan, malodorous OP in JP. Flushes easily with no leaking around site.  ? ?Drain #3. Positive RUQ drain to a suction bulb. Site is unremarkable with no erythema, edema, bleeding or drainage.  Site tender to palpation. Suture and stat lock in place. There is a long sutures hanging from the drain suture clamp. Pt advised not to pull. Dressing is clean, dry, and intact. ~25 cc of murky, white OP in JP.   ?Neurological:  ?   Mental Status: She is alert.  ? ? ?Imaging: ?No results found. ? ?Labs: ? ?CBC: ?Recent Labs  ?  12/08/21 ?0234 12/09/21 ?0926 12/10/21 ?02/09/22 12/13/21 ?0144  ?WBC 10.6* 11.0* 11.7* 18.2*  ?HGB 8.8* 8.7* 8.4* 7.8*  ?HCT 25.7* 26.0* 25.3* 24.2*  ?PLT 529* 536* 551* 539*  ? ? ?COAGS: ?Recent Labs  ?  11/28/21 ?1054  ?INR 1.2  ? ? ?BMP: ?Recent Labs  ?  12/04/21 ?0713 12/06/21 ?1044 12/08/21 ?0234 12/10/21 ?02/09/22 12/11/21 ?02/10/22  ?NA 137 136 135 136  --   ?K 3.5 3.7 4.0 3.8  --   ?CL 101 100 100 101  --   ?CO2 30 29 29 30   --   ?GLUCOSE 87 115* 87 96  --   ?BUN 5* 5* 8 8  --   ?  CALCIUM 8.2* 8.4* 8.8* 8.5*  --   ?CREATININE 0.37* 0.42* 0.48 0.53 0.49  ?GFRNONAA >60 >60 >60 >60 >60  ? ? ?LIVER FUNCTION TESTS: ?No results for input(s): BILITOT, AST, ALT, ALKPHOS, PROT, ALBUMIN in the last 8760 hours. ? ?Assessment and Plan: ? ?Sigmoid perforation s/p 3 drain placements into fluid collections ?--drains functioning well Drain 1: ~10cc OP last 24, Drain 2: ~10cc OP (purulent) last 24, Drain 3: ~30 cc OP last 24 ?--WBC at 18.2 (11.7) ?--afebrile ? ?Continue to flush drain with 5 cc NS TID. ?Record OP q shift.  ?Change dressing q shift or as needed to keep clean and dry.  ?Contact IR if difficulty flushing or if there is a sudden change in OP.  ?IR to follow- call with questions or concerns.    ? ?Electronically Signed: ?Shon Hough, NP ?12/13/2021, 11:55 AM ? ? ?I spent a total of 15 Minutes at the the patient's bedside AND on the patient's hospital floor  or unit, greater than 50% of which was counseling/coordinating care for s/p 3 drain placements for sigmoid perforation with abdominal fluid collections.   ? ? ?  ?

## 2021-12-13 NOTE — Progress Notes (Signed)
Physical Therapy Treatment ?Patient Details ?Name: Judith Miller ?MRN: [id number] ?DOB: [birth year] ?Today's Date: 12/13/2021 ? ? ?History of Present Illness 61 y.o. female presents to [facility] hospital 11/27/2021 as a transfer from [facility] with perforated sigmoid colon. Pt underwent CT guided pelvic drain placement on 3/26. PMH includes bipolar disorder. ? ?  ?PT Comments  ? ? Pt had just walked and declined to get OOB but readily agreed to exercises.  Pt is motivated to walk on the hallway but also once a bit tired tends to wait.  Her plan to encourage mobility is hindered by abd pain, by discomfort and by limited endurance.  Pt reiterated her regret about having stopped regular workouts before this issue, but reassured her that everyone would have lost ground on tolerance to move with her issues.  Encourage her to be up in the chair and to progress with the opportunities to walk every day.  Assured her that the frequency of moving would help her to get home and back to work faster.   ?Recommendations for follow up therapy are one component of a multi-disciplinary discharge planning process, led by the attending physician.  Recommendations may be updated based on patient status, additional functional criteria and insurance authorization. ? ?Follow Up Recommendations ? Home health PT ?  ?  ?Assistance Recommended at Discharge Intermittent Supervision/Assistance  ?Patient can return home with the following [name];[name];Assistance with cooking/housework;Assist for transportation;[name] ?  ?Equipment Recommendations ? Rolling walker (2 wheels)  ?  ?Recommendations for Other Services   ? ? ?  ?Precautions / Restrictions Precautions ?Precautions: Fall ?Precaution Comments: JP drain x 3 ?Restrictions ?Weight Bearing Restrictions: No ?Other Position/Activity Restrictions: skin change on sacral area  ?  ? ?Mobility ?  Bed Mobility ?Overal bed mobility: Needs Assistance ?Bed Mobility: Rolling (scooting up the bed) ?Rolling: Supervision ?  ?  ?  ?  ?General bed mobility comments: scooting up bed with supervision and cues for managing pain on abd ?  ? ?Transfers ?Overall transfer level: Needs assistance ?  ?  ?  ?  ?  ?  ?  ?  ?General transfer comment: declined OOB ?  ? ?Ambulation/Gait ?  ?  ?  ?  ?  ?  ?  ?  ? ? ?Stairs ?  ?  ?  ?  ?  ? ? ?Wheelchair Mobility ?  ? ?Modified Rankin (Stroke Patients Only) ?  ? ? ?  ?Balance   ?  ?  ?  ?  ?  ?  ?  ?  ?  ?  ?  ?  ?  ?  ?  ?  ?  ?  ?  ? ?  ?Cognition Arousal/Alertness: Awake/alert ?Behavior During Therapy: [facility] for tasks assessed/performed ?Overall Cognitive Status: Within Functional Limits for tasks assessed ?  ?  ?  ?  ?  ?  ?  ?  ?  ?  ?  ?  ?  ?  ?  ?  ?  ?  ?  ? ?  ?Exercises General Exercises - Lower Extremity ?Ankle Circles/Pumps: AROM, 5 reps ?Quad Sets: AROM, 10 reps ?Gluteal Sets: AROM, 10 reps ?Heel Slides: AROM, 10 reps ?Hip ABduction/ADduction: AROM, 10 reps ? ?  ?General Comments   ?  ?  ? ?Pertinent Vitals/Pain Pain Assessment ?Pain Assessment: Faces ?Faces Pain Scale: Hurts little more ?Pain Location: abdomen, buttocks ?Pain Descriptors /  Indicators: Guarding, Grimacing ?Pain Intervention(s): Limited activity within patient's tolerance, Monitored during session, Premedicated before session, Repositioned  ? ? ?Home Living   ?  ?  ?  ?  ?  ?  ?  ?  ?  ?   ?  ?Prior Function    ?  ?  ?   ? ?PT Goals (current goals can now be found in the care plan section) Acute Rehab PT Goals ?Patient Stated Goal: go back to work ?Progress towards PT goals: Progressing toward goals ? ?  ?Frequency ? ? ? Min 3X/week ? ? ? ?  ?PT Plan Current plan remains appropriate  ? ? ?Co-evaluation   ?  ?  ?  ?  ? ?  ?AM-PAC PT "6 Clicks" Mobility   ?Outcome Measure ? Help needed turning from your back to your side while in a flat bed without using bedrails?: A Little ?Help needed moving from lying on  your back to sitting on the side of a flat bed without using bedrails?: A Little ?Help needed moving to and from a bed to a chair (including a wheelchair)?: A Little ?Help needed standing up from a chair using your arms (e.g., wheelchair or bedside chair)?: A Little ?Help needed to walk in hospital room?: A Little ?Help needed climbing 3-5 steps with a railing? : A Lot ?6 Click Score: 17 ? ?  ?End of Session   ?Activity Tolerance: Patient tolerated treatment well ?Patient left: in bed;with call bell/phone within reach;with bed alarm set ?Nurse Communication: Mobility status ?PT Visit Diagnosis: Other abnormalities of gait and mobility (R26.89);Muscle weakness (generalized) (M62.81);Pain ?Pain - Right/Left:  (abdomen) ?Pain - part of body:  (abdomen) ?  ? ? ?Time: [id number] ?PT Time Calculation (min) (ACUTE ONLY): 19 min ? ?Charges:  $Therapeutic Exercise: 8-22 mins    ?Ramond Dial ?12/13/2021, 5:11 PM ? ?Mee Hives, PT PhD ?Acute Rehab Dept. Number: [facility] [id number] and [place] [phone] ? ? ?

## 2021-12-13 NOTE — Progress Notes (Addendum)
Mobility Specialist Progress Note: ? ? 12/13/21 1059  ?Mobility  ?Activity Ambulated with assistance in room  ?Level of Assistance Independent after set-up  ?Assistive Device None  ?Distance Ambulated (ft) 200 ft  ?Activity Response Tolerated well  ?$Mobility charge 1 Mobility  ? ?Pt received in bed willing to participate in mobility. Complaints of 4/10 lower abdomen pain. Pt ambulated to BR and was able to have a BM. Pt stating tail bone hurts d/t pressure injury but encouraged to sit up in chair. Call bell in reach and all needs met.  ? ?Joscelyn Hardrick ?Mobility Specialist ?Primary Phone [phone] ? ?

## 2021-12-13 NOTE — Progress Notes (Signed)
? ? ?   ?Subjective: ?CC: ?Patient reports since Friday she has had decreased appetite with early satiety.  She has felt fevers over the weekend.  Yesterday started having more right-sided abdominal pain and began requiring IV pain medication again.  She denies nausea or vomiting.  Continues to pass flatus and had a 2 BMs that were formed yesterday.  Voiding.  Mobilizing in the room and halls.  Denies chest pain or shortness of breath.  No other complaints. ? ?Objective: ?Vital signs in last 24 hours: ?Temp:  [97.9 ?F (36.6 ?C)-99 ?F (37.2 ?C)] 97.9 ?F (36.6 ?C) (04/10 8315) ?Pulse Rate:  [74-88] 79 (04/10 1761) ?Resp:  [16-19] 19 (04/10 6073) ?BP: (120-144)/(55-61) 120/61 (04/10 7106) ?SpO2:  [86 %-96 %] 89 % (04/10 0822) ?Last BM Date : 12/11/21 ? ?Intake/Output from previous day: ?04/09 0701 - 04/10 0700 ?In: 641.7 [P.O.:580; IV Piggyback:46.7] ?Out: 10 [Drains:10] ?Intake/Output this shift: ?Total I/O ?In: -  ?Out: 30 [Drains:30] ? ?PE: ?Const: Awake and alert, NAD ?Heart: RRR, trace b/l LE edema ?Lungs: CTA b/l, normal rate and effort ?GI: Soft, TTP around drains and more on the right hemiabdomen without peritonitis; TG drain now feculent (appears it was purulent on 4/7), superior anterior drain with serous fluid today, inferior anterior drain with serous fluid  ? ?Lab Results:  ?Recent Labs  ?  12/13/21 ?0144  ?WBC 18.2*  ?HGB 7.8*  ?HCT 24.2*  ?PLT 539*  ? ?BMET ?Recent Labs  ?  12/11/21 ?2694  ?CREATININE 0.49  ? ?PT/INR ?No results for input(s): LABPROT, INR in the last 72 hours. ?CMP  ?   ?Component Value Date/Time  ? NA 136 12/10/2021 0046  ? K 3.8 12/10/2021 0046  ? CL 101 12/10/2021 0046  ? CO2 30 12/10/2021 0046  ? GLUCOSE 96 12/10/2021 0046  ? BUN 8 12/10/2021 0046  ? CREATININE 0.49 12/11/2021 0445  ? CALCIUM 8.5 (L) 12/10/2021 0046  ? GFRNONAA >60 12/11/2021 0445  ? GFRAA  02/27/2010 2026  ?  >60        ?The eGFR has been calculated ?using the MDRD equation. ?This calculation has not  been ?validated in all clinical ?situations. ?eGFR's persistently ?<60 mL/min signify ?possible Chronic Kidney Disease.  ? ?Lipase  ?No results found for: LIPASE ? ?Studies/Results: ?No results found. ? ?Anti-infectives: ?Anti-infectives (From admission, onward)  ? ? Start     Dose/Rate Route Frequency Ordered Stop  ? 12/08/21 1500  piperacillin-tazobactam (ZOSYN) IVPB 3.375 g       ? 3.375 g ?12.5 mL/hr over 240 Minutes Intravenous Every 8 hours 12/08/21 1414    ? 11/27/21 2300  piperacillin-tazobactam (ZOSYN) IVPB 3.375 g       ? 3.375 g ?12.5 mL/hr over 240 Minutes Intravenous Every 8 hours 11/27/21 2202 12/04/21 2025  ? ?  ? ? ? ?Assessment/Plan ?Sigmoid diverticulitis with pelvic abscesses ?  S/P IR drainage x 2 3/26.  ?CT 4/2: decompression of previously drained collections, complex R flank collection increased in size; s/p IR drainage 4/4 ?- Cxs from 3/26 with klebsiella/pseudomonas/enterococcus; Cx from 4/4 with E.coli/Klebsiella/Strep constellatus - currently on IV zosyn ? ?Patient has now been on IV antibiotics for greater than 2 weeks and is status post IR drain x3.  She is having increased pain, WBC and her L TG drain is now feculent.  We will get repeat CT A/P today.  We discussed with patient that depending on the CT scan she may require surgery during this admission and will  likely result in a colostomy. ?  ?FEN: CLD, SLIV ?ID: Zosyn 3/25>> day 17 ?VTE: SCD's, lovenox ?Foley: none ?  ?Small bilateral pleural effusions with atelectasis vs consolidation  ?Bipolar disorder, home meds.  ? ?I reviewed last 24 h vitals and pain scores, last 48 h intake and output, last 24 h labs and trends, and last 24 h imaging results. ? ? ? LOS: 16 days  ? ? ?Jillyn Ledger , PA-C ?[facility] ?12/13/2021, 12:27 PM ?Please see Amion for pager number during day hours 7:00am-4:30pm ? ?

## 2021-12-14 ENCOUNTER — Inpatient Hospital Stay (HOSPITAL_COMMUNITY): Payer: Self-pay

## 2021-12-14 LAB — BASIC METABOLIC PANEL
Anion gap: 7 (ref 5–15)
BUN: 7 mg/dL (ref 6–20)
CO2: 30 mmol/L (ref 22–32)
Calcium: 8.5 mg/dL — ABNORMAL LOW (ref 8.9–10.3)
Chloride: 99 mmol/L (ref 98–111)
Creatinine, Ser: 0.4 mg/dL — ABNORMAL LOW (ref 0.44–1.00)
GFR, Estimated: >60 mL/min (ref >60)
Glucose, Bld: 104 mg/dL — ABNORMAL HIGH (ref 70–99)
Potassium: 3.5 mmol/L (ref 3.5–5.1)
Sodium: 136 mmol/L (ref 135–145)

## 2021-12-14 LAB — CBC
HCT: 25.1 % — ABNORMAL LOW (ref 36.0–46.0)
Hemoglobin: 8.1 g/dL — ABNORMAL LOW (ref 12.0–15.0)
MCH: 27.6 pg (ref 26.0–34.0)
MCHC: 32.3 g/dL (ref 30.0–36.0)
MCV: 85.4 fL (ref 80.0–100.0)
Platelets: 590 10*3/uL — ABNORMAL HIGH (ref 150–400)
RBC: 2.94 MIL/uL — ABNORMAL LOW (ref 3.87–5.11)
RDW: 14.9 % (ref 11.5–15.5)
WBC: 14.7 10*3/uL — ABNORMAL HIGH (ref 4.0–10.5)
nRBC: 0 % (ref 0.0–0.2)

## 2021-12-14 MED ORDER — NICOTINE 21 MG/24HR TD PT24
21.0000 mg | MEDICATED_PATCH | Freq: Every day | TRANSDERMAL | Status: DC
  Administered 2021-12-14 – 2021-12-28 (×14): 21 mg via TRANSDERMAL
  Filled 2021-12-14 (×14): qty 1

## 2021-12-14 MED ORDER — ZINC OXIDE 40 % EX OINT
TOPICAL_OINTMENT | Freq: Four times a day (QID) | CUTANEOUS | Status: DC | PRN
  Filled 2021-12-14 (×2): qty 57

## 2021-12-14 MED ORDER — IOHEXOL 9 MG/ML PO SOLN
ORAL | Status: AC
  Filled 2021-12-14: qty 1000

## 2021-12-14 MED ORDER — IOHEXOL 300 MG/ML  SOLN
100.0000 mL | Freq: Once | INTRAMUSCULAR | Status: AC | PRN
  Administered 2021-12-14: 100 mL via INTRAVENOUS

## 2021-12-14 NOTE — Progress Notes (Signed)
Mobility Specialist Progress Note: ? ? 12/14/21 1435  ?Mobility  ?Activity Ambulated with assistance in hallway  ?Level of Assistance Independent after set-up  ?Assistive Device None  ?Distance Ambulated (ft) 570 ft  ?Activity Response Tolerated well  ?$Mobility charge 1 Mobility  ? ?Second walk of the Judith Miller. Complaints of low abdomen pain. Left in bed with call bell in reach and all needs met.  ? ?Judith Miller ?Mobility Specialist ?Primary Phone [phone] ? ?

## 2021-12-14 NOTE — Progress Notes (Signed)
Mobility Specialist Progress Note: ? ? 12/14/21 1055  ?Mobility  ?Activity Ambulated with assistance in hallway  ?Level of Assistance Independent after set-up  ?Assistive Device None  ?Distance Ambulated (ft) 570 ft  ?Activity Response Tolerated well  ?$Mobility charge 1 Mobility  ? ?Pt received in bed willing to participate in mobility. Complaints of 2/10 pain in her low abdomen where the drain is. Left in bed with call bell in reach and all needs met.  ? ?Tanny Harnack ?Mobility Specialist ?Primary Phone [phone] ? ?

## 2021-12-14 NOTE — Consult Note (Addendum)
WOC Nurse requested for preoperative stoma site marking. ?Discussed surgical procedure and stoma creation with patient.  Explained role of the WOC nurse team. Provided the patient with educational booklet and provided samples of pouching options.  Answered patient's questions.  ? ?Examined patient lying and sitting on the side of the bed in order to place the marking in the patient's visual field, away from any creases or abdominal contour issues and within the rectus muscle. Attempted to mark below the patient's belt line, but this was not possible since a significant crease occurs lower on the abd when sitting forward, which should be avoided if possible ? ?Marked for colostomy in the LLQ  __7__ cm to the left of the umbilicus and _3___cm above the umbilicus. ? ?Patient's abdomen cleansed with CHG wipes at site markings, allowed to air dry prior to marking. ?WOC Nurse team will follow up with patient after surgery for continued ostomy care and teaching.  ?Cammie Mcgee MSN, RN, CWOCN, CWCN-AP, CNS ?[phone]  ?

## 2021-12-14 NOTE — Progress Notes (Signed)
[facility] ?Progress Note ? ?   ?Subjective: ?CC-  ?Feels about the same as yesterday. She reports some mild right sided abdominal pain. Denies any nausea or vomiting. Continues to pass flatus, no BM yesterday. ?WBC down 14.7, afebrile ? ?Objective: ?Vital signs in last 24 hours: ?Temp:  [98.2 ?F (36.8 ?C)-98.8 ?F (37.1 ?C)] 98.8 ?F (37.1 ?C) (04/10 1952) ?Pulse Rate:  [85-86] 85 (04/10 1952) ?Resp:  [18] 18 (04/10 1952) ?BP: (122-141)/(51-67) 141/67 (04/10 1952) ?SpO2:  [92 %-97 %] 97 % (04/10 1952) ?Last BM Date : 12/11/21 ? ?Intake/Output from previous day: ?04/10 0701 - 04/11 0700 ?In: 720 [P.O.:720] ?Out: 95 [Drains:95] ?Intake/Output this shift: ?No intake/output data recorded. ? ?PE: ?Const: Alert, NAD ?GI: Soft, TTP around drains and more on the right hemiabdomen without peritonitis; TG drain now feculent (appears it was purulent on 4/7), superior anterior drain with trace serous fluid today, inferior anterior drain with trace serous fluid  ? ?Lab Results:  ?Recent Labs  ?  12/13/21 ?0144 12/14/21 ?0041  ?WBC 18.2* 14.7*  ?HGB 7.8* 8.1*  ?HCT 24.2* 25.1*  ?PLT 539* 590*  ? ?BMET ?Recent Labs  ?  12/14/21 ?0041  ?NA 136  ?K 3.5  ?CL 99  ?CO2 30  ?GLUCOSE 104*  ?BUN 7  ?CREATININE 0.40*  ?CALCIUM 8.5*  ? ?PT/INR ?No results for input(s): LABPROT, INR in the last 72 hours. ?CMP  ?   ?Component Value Date/Time  ? NA 136 12/14/2021 0041  ? K 3.5 12/14/2021 0041  ? CL 99 12/14/2021 0041  ? CO2 30 12/14/2021 0041  ? GLUCOSE 104 (H) 12/14/2021 0041  ? BUN 7 12/14/2021 0041  ? CREATININE 0.40 (L) 12/14/2021 0041  ? CALCIUM 8.5 (L) 12/14/2021 0041  ? GFRNONAA >60 12/14/2021 0041  ? GFRAA  02/27/2010 2026  ?  >60        ?The eGFR has been calculated ?using the MDRD equation. ?This calculation has not been ?validated in all clinical ?situations. ?eGFR's persistently ?<60 mL/min signify ?possible Chronic Kidney Disease.  ? ?Lipase  ?No results found for: LIPASE ? ? ? ? ?Studies/Results: ?No results  found. ? ?Anti-infectives: ?Anti-infectives (From admission, onward)  ? ? Start     Dose/Rate Route Frequency Ordered Stop  ? 12/08/21 1500  piperacillin-tazobactam (ZOSYN) IVPB 3.375 g       ? 3.375 g ?12.5 mL/hr over 240 Minutes Intravenous Every 8 hours 12/08/21 1414    ? 11/27/21 2300  piperacillin-tazobactam (ZOSYN) IVPB 3.375 g       ? 3.375 g ?12.5 mL/hr over 240 Minutes Intravenous Every 8 hours 11/27/21 2202 12/04/21 2025  ? ?  ? ? ? ?Assessment/Plan ?Sigmoid diverticulitis with pelvic abscesses ?- S/P IR drainage x 2 3/26.  ?- CT 4/2: decompression of previously drained collections, complex R flank collection increased in size; s/p IR drainage 4/4 ?- Cxs from 3/26 with klebsiella/ pseudomonas/ enterococcus; Cx from 4/4 with E.coli/ Klebsiella/ Strep constellatus - currently on IV zosyn ?- CT abdomen/pelvis today - I spoke with radiology and it should be done around 1100. Continue IV antibiotics and drains for now. Again discussed with the patient that she has been here for 2 weeks, has 3 IR drains, and has not fully clinically improved therefore it may be time to consider surgery. We will follow up after CT. ?  ?FEN: CLD, SLIV ?ID: Zosyn 3/25>> day 18 ?VTE: SCD's, lovenox ?Foley: none ?  ?Small bilateral pleural effusions with atelectasis vs consolidation  ?Bipolar  disorder, home meds.  ?  ?I reviewed last 24 h vitals and pain scores, last 48 h intake and output, last 24 h labs and trends (including CBC and BMP). ? ? ? LOS: 17 days  ? ? ?Wellington Hampshire, PA-C ?[facility] ?12/14/2021, 8:41 AM ?Please see Amion for pager number during day hours 7:00am-4:30pm ? ?

## 2021-12-15 ENCOUNTER — Inpatient Hospital Stay: Payer: Self-pay

## 2021-12-15 ENCOUNTER — Encounter (HOSPITAL_COMMUNITY): Payer: Self-pay

## 2021-12-15 ENCOUNTER — Encounter (HOSPITAL_COMMUNITY)
Admission: AD | Disposition: A | Discharge status: Home Health | Payer: Self-pay | Source: Other Acute Inpatient Hospital

## 2021-12-15 ENCOUNTER — Inpatient Hospital Stay (HOSPITAL_COMMUNITY): Payer: Self-pay | Admitting: Anesthesiology

## 2021-12-15 ENCOUNTER — Other Ambulatory Visit: Payer: Self-pay

## 2021-12-15 DIAGNOSIS — F319 Bipolar disorder, unspecified: Secondary | ICD-10-CM

## 2021-12-15 DIAGNOSIS — K5732 Diverticulitis of large intestine without perforation or abscess without bleeding: Secondary | ICD-10-CM

## 2021-12-15 HISTORY — PX: BOWEL RESECTION: SHX1257

## 2021-12-15 HISTORY — PX: TRANSVERSE LOOP COLOSTOMY: SHX6478

## 2021-12-15 HISTORY — PX: LAPAROTOMY: SHX154

## 2021-12-15 LAB — BASIC METABOLIC PANEL
Anion gap: 7 (ref 5–15)
BUN: 5 mg/dL — ABNORMAL LOW (ref 6–20)
CO2: 31 mmol/L (ref 22–32)
Calcium: 8.5 mg/dL — ABNORMAL LOW (ref 8.9–10.3)
Chloride: 99 mmol/L (ref 98–111)
Creatinine, Ser: 0.44 mg/dL (ref 0.44–1.00)
GFR, Estimated: >60 mL/min (ref >60)
Glucose, Bld: 91 mg/dL (ref 70–99)
Potassium: 3.8 mmol/L (ref 3.5–5.1)
Sodium: 137 mmol/L (ref 135–145)

## 2021-12-15 LAB — PREPARE RBC (CROSSMATCH)

## 2021-12-15 LAB — POCT I-STAT, CHEM 8
BUN: 4 mg/dL — ABNORMAL LOW (ref 6–20)
Calcium, Ion: 1.21 mmol/L (ref 1.15–1.40)
Chloride: 97 mmol/L — ABNORMAL LOW (ref 98–111)
Creatinine, Ser: 0.2 mg/dL — ABNORMAL LOW (ref 0.44–1.00)
Glucose, Bld: 97 mg/dL (ref 70–99)
HCT: 23 % — ABNORMAL LOW (ref 36.0–46.0)
Hemoglobin: 7.8 g/dL — ABNORMAL LOW (ref 12.0–15.0)
Potassium: 3.4 mmol/L — ABNORMAL LOW (ref 3.5–5.1)
Sodium: 136 mmol/L (ref 135–145)
TCO2: 30 mmol/L (ref 22–32)

## 2021-12-15 LAB — CBC
HCT: 25.1 % — ABNORMAL LOW (ref 36.0–46.0)
Hemoglobin: 8.1 g/dL — ABNORMAL LOW (ref 12.0–15.0)
MCH: 27.8 pg (ref 26.0–34.0)
MCHC: 32.3 g/dL (ref 30.0–36.0)
MCV: 86.3 fL (ref 80.0–100.0)
Platelets: 590 10*3/uL — ABNORMAL HIGH (ref 150–400)
RBC: 2.91 MIL/uL — ABNORMAL LOW (ref 3.87–5.11)
RDW: 15 % (ref 11.5–15.5)
WBC: 12.9 10*3/uL — ABNORMAL HIGH (ref 4.0–10.5)
nRBC: 0 % (ref 0.0–0.2)

## 2021-12-15 LAB — ABO/RH: ABO/RH(D): A POS

## 2021-12-15 SURGERY — LAPAROTOMY, EXPLORATORY
Anesthesia: General | Site: Abdomen

## 2021-12-15 MED ORDER — MIDAZOLAM HCL 2 MG/2ML IJ SOLN
INTRAMUSCULAR | Status: AC
  Filled 2021-12-15: qty 2

## 2021-12-15 MED ORDER — ORAL CARE MOUTH RINSE: 15.0000 mL | Freq: Once | OROMUCOSAL | Status: AC

## 2021-12-15 MED ORDER — ONDANSETRON HCL 4 MG/2ML IJ SOLN
INTRAMUSCULAR | Status: DC | PRN
Start: 2021-12-15 — End: 2021-12-15
  Administered 2021-12-15: 4 mg via INTRAVENOUS

## 2021-12-15 MED ORDER — ACETAMINOPHEN 10 MG/ML IV SOLN
1000.0000 mg | Freq: Four times a day (QID) | INTRAVENOUS | Status: AC
  Administered 2021-12-15 – 2021-12-16 (×4): 1000 mg via INTRAVENOUS
  Filled 2021-12-15 (×4): qty 100

## 2021-12-15 MED ORDER — CHLORHEXIDINE GLUCONATE 0.12 % MT SOLN: 15.0000 mL | Freq: Once | OROMUCOSAL | Status: AC

## 2021-12-15 MED ORDER — PROPOFOL 10 MG/ML IV BOLUS
INTRAVENOUS | Status: AC
Start: 2021-12-15 — End: ?
  Filled 2021-12-15: qty 20

## 2021-12-15 MED ORDER — LACTATED RINGERS IV SOLN: INTRAVENOUS | Status: DC

## 2021-12-15 MED ORDER — SUFENTANIL CITRATE 50 MCG/ML IV SOLN
INTRAVENOUS | Status: AC
  Filled 2021-12-15: qty 1

## 2021-12-15 MED ORDER — METHOCARBAMOL 1000 MG/10ML IJ SOLN
500.0000 mg | Freq: Four times a day (QID) | INTRAVENOUS | Status: DC
  Administered 2021-12-15 – 2021-12-20 (×19): 500 mg via INTRAVENOUS
  Filled 2021-12-15 (×6): qty 5
  Filled 2021-12-15: qty 500
  Filled 2021-12-15: qty 5
  Filled 2021-12-15: qty 500
  Filled 2021-12-15 (×2): qty 5
  Filled 2021-12-15: qty 500
  Filled 2021-12-15 (×2): qty 5
  Filled 2021-12-15 (×2): qty 500
  Filled 2021-12-15: qty 5
  Filled 2021-12-15 (×2): qty 500
  Filled 2021-12-15 (×3): qty 5
  Filled 2021-12-15: qty 500
  Filled 2021-12-15: qty 5

## 2021-12-15 MED ORDER — 0.9 % SODIUM CHLORIDE (POUR BTL) OPTIME
TOPICAL | Status: DC | PRN
  Administered 2021-12-15 (×2): 1000 mL

## 2021-12-15 MED ORDER — MIDAZOLAM HCL 2 MG/2ML IJ SOLN
INTRAMUSCULAR | Status: DC | PRN
  Administered 2021-12-15: 2 mg via INTRAVENOUS

## 2021-12-15 MED ORDER — LIDOCAINE 2% (20 MG/ML) 5 ML SYRINGE
INTRAMUSCULAR | Status: DC | PRN
Start: 2021-12-15 — End: 2021-12-15
  Administered 2021-12-15: 30 mg via INTRAVENOUS

## 2021-12-15 MED ORDER — SODIUM CHLORIDE 0.9% FLUSH
5.0000 mL | Freq: Three times a day (TID) | INTRAVENOUS | Status: DC
  Administered 2021-12-15 – 2021-12-28 (×33): 5 mL

## 2021-12-15 MED ORDER — AMISULPRIDE (ANTIEMETIC) 5 MG/2ML IV SOLN: 10.0000 mg | Freq: Once | INTRAVENOUS | Status: DC | PRN

## 2021-12-15 MED ORDER — HYDROMORPHONE HCL 1 MG/ML IJ SOLN
0.2500 mg | INTRAMUSCULAR | Status: DC | PRN
  Administered 2021-12-15 (×4): 0.5 mg via INTRAVENOUS

## 2021-12-15 MED ORDER — SUGAMMADEX SODIUM 200 MG/2ML IV SOLN
INTRAVENOUS | Status: DC | PRN
Start: 2021-12-15 — End: 2021-12-15
  Administered 2021-12-15: 400 mg via INTRAVENOUS

## 2021-12-15 MED ORDER — SUFENTANIL CITRATE 50 MCG/ML IV SOLN
INTRAVENOUS | Status: DC | PRN
  Administered 2021-12-15 (×3): 10 ug via INTRAVENOUS
  Administered 2021-12-15: 15 ug via INTRAVENOUS
  Administered 2021-12-15: 5 ug via INTRAVENOUS

## 2021-12-15 MED ORDER — PIPERACILLIN-TAZOBACTAM 3.375 G IVPB
3.3750 g | Freq: Three times a day (TID) | INTRAVENOUS | Status: AC
  Administered 2021-12-15 – 2021-12-20 (×15): 3.375 g via INTRAVENOUS
  Filled 2021-12-15 (×15): qty 50

## 2021-12-15 MED ORDER — CHLORHEXIDINE GLUCONATE CLOTH 2 % EX PADS
6.0000 | MEDICATED_PAD | Freq: Once | CUTANEOUS | Status: AC
  Administered 2021-12-15: 6 via TOPICAL

## 2021-12-15 MED ORDER — LACTATED RINGERS IV SOLN: INTRAVENOUS | Status: DC | PRN

## 2021-12-15 MED ORDER — MEPERIDINE HCL 25 MG/ML IJ SOLN: 6.2500 mg | INTRAMUSCULAR | Status: DC | PRN

## 2021-12-15 MED ORDER — CHLORHEXIDINE GLUCONATE 0.12 % MT SOLN
OROMUCOSAL | Status: AC
  Administered 2021-12-15: 15 mL via OROMUCOSAL
  Filled 2021-12-15: qty 15

## 2021-12-15 MED ORDER — HYDROMORPHONE HCL 1 MG/ML IJ SOLN
0.5000 mg | INTRAMUSCULAR | Status: DC | PRN
  Administered 2021-12-15 – 2021-12-16 (×5): 1 mg via INTRAVENOUS
  Filled 2021-12-15 (×5): qty 1

## 2021-12-15 MED ORDER — POTASSIUM CHLORIDE 10 MEQ/100ML IV SOLN
10.0000 meq | INTRAVENOUS | Status: AC
  Administered 2021-12-15 (×2): 10 meq via INTRAVENOUS
  Filled 2021-12-15 (×3): qty 100

## 2021-12-15 MED ORDER — PROPOFOL 10 MG/ML IV BOLUS
INTRAVENOUS | Status: DC | PRN
  Administered 2021-12-15: 150 mg via INTRAVENOUS

## 2021-12-15 MED ORDER — SODIUM CHLORIDE 0.9 % IV SOLN
INTRAVENOUS | Status: AC
Start: 2021-12-15 — End: 2021-12-16

## 2021-12-15 MED ORDER — DEXAMETHASONE SODIUM PHOSPHATE 10 MG/ML IJ SOLN
INTRAMUSCULAR | Status: DC | PRN
  Administered 2021-12-15: 10 mg via INTRAVENOUS

## 2021-12-15 MED ORDER — OXYCODONE HCL 5 MG PO TABS: 5.0000 mg | ORAL_TABLET | Freq: Once | ORAL | Status: DC | PRN

## 2021-12-15 MED ORDER — HYDROMORPHONE HCL 1 MG/ML IJ SOLN
INTRAMUSCULAR | Status: AC
  Filled 2021-12-15: qty 1

## 2021-12-15 MED ORDER — SODIUM CHLORIDE 0.9% IV SOLUTION: Freq: Once | INTRAVENOUS | Status: AC

## 2021-12-15 MED ORDER — PHENYLEPHRINE HCL-NACL 20-0.9 MG/250ML-% IV SOLN
INTRAVENOUS | Status: DC | PRN
  Administered 2021-12-15: 50 ug/min via INTRAVENOUS

## 2021-12-15 MED ORDER — ROCURONIUM BROMIDE 10 MG/ML (PF) SYRINGE
PREFILLED_SYRINGE | INTRAVENOUS | Status: DC | PRN
  Administered 2021-12-15: 20 mg via INTRAVENOUS
  Administered 2021-12-15: 60 mg via INTRAVENOUS
  Administered 2021-12-15: 20 mg via INTRAVENOUS

## 2021-12-15 MED ORDER — OXYCODONE HCL 5 MG/5ML PO SOLN: 5.0000 mg | Freq: Once | ORAL | Status: DC | PRN

## 2021-12-15 MED ORDER — SUCCINYLCHOLINE CHLORIDE 200 MG/10ML IV SOSY
PREFILLED_SYRINGE | INTRAVENOUS | Status: DC | PRN
  Administered 2021-12-15: 100 mg via INTRAVENOUS

## 2021-12-15 MED ORDER — PROMETHAZINE HCL 25 MG/ML IJ SOLN: 6.2500 mg | INTRAMUSCULAR | Status: DC | PRN

## 2021-12-15 SURGICAL SUPPLY — 71 items
ADH SKN CLS APL DERMABOND .7 (GAUZE/BANDAGES/DRESSINGS) ×2
APL PRP STRL LF DISP 70% ISPRP (MISCELLANEOUS) ×2
BAG COUNTER SPONGE SURGICOUNT (BAG) ×2 IMPLANT
BAG SPNG CNTER NS LX DISP (BAG) ×1
BLADE CLIPPER SURG (BLADE) IMPLANT
BNDG GAUZE ELAST 4 BULKY (GAUZE/BANDAGES/DRESSINGS) ×1 IMPLANT
CANISTER SUCT 3000ML PPV (MISCELLANEOUS) ×3 IMPLANT
CATH ROBINSON RED A/P 14FR (CATHETERS) ×1 IMPLANT
CHLORAPREP W/TINT 26 (MISCELLANEOUS) ×3 IMPLANT
COVER SURGICAL LIGHT HANDLE (MISCELLANEOUS) ×2 IMPLANT
DERMABOND ADVANCED (GAUZE/BANDAGES/DRESSINGS) ×2
DERMABOND ADVANCED .7 DNX12 (GAUZE/BANDAGES/DRESSINGS) ×2 IMPLANT
DRAIN CHANNEL 19F RND (DRAIN) ×1 IMPLANT
DRAPE INCISE IOBAN 66X45 STRL (DRAPES) ×2 IMPLANT
DRAPE LAPAROSCOPIC ABDOMINAL (DRAPES) ×2 IMPLANT
DRAPE WARM FLUID 44X44 (DRAPES) ×2 IMPLANT
DRSG OPSITE POSTOP 4X10 (GAUZE/BANDAGES/DRESSINGS) IMPLANT
DRSG OPSITE POSTOP 4X8 (GAUZE/BANDAGES/DRESSINGS) IMPLANT
DRSG PAD ABDOMINAL 8X10 ST (GAUZE/BANDAGES/DRESSINGS) ×2 IMPLANT
ELECT BLADE 4.0 EZ CLEAN MEGAD (MISCELLANEOUS) ×2
ELECT BLADE 6.5 EXT (BLADE) IMPLANT
ELECT CAUTERY BLADE 6.4 (BLADE) ×2 IMPLANT
ELECT REM PT RETURN 9FT ADLT (ELECTROSURGICAL) ×2
ELECTRODE BLDE 4.0 EZ CLN MEGD (MISCELLANEOUS) IMPLANT
ELECTRODE REM PT RTRN 9FT ADLT (ELECTROSURGICAL) ×1 IMPLANT
EVACUATOR SILICONE 100CC (DRAIN) ×1 IMPLANT
GAUZE SPONGE 4X4 12PLY STRL (GAUZE/BANDAGES/DRESSINGS) ×1 IMPLANT
GLOVE BIO SURGEON STRL SZ 6 (GLOVE) ×1 IMPLANT
GLOVE BIO SURGEON STRL SZ 6.5 (GLOVE) ×1 IMPLANT
GLOVE BIO SURGEON STRL SZ8 (GLOVE) ×1 IMPLANT
GLOVE BIOGEL PI IND STRL 6 (GLOVE) ×1 IMPLANT
GLOVE BIOGEL PI IND STRL 6.5 (GLOVE) IMPLANT
GLOVE BIOGEL PI IND STRL 8 (GLOVE) IMPLANT
GLOVE BIOGEL PI INDICATOR 6 (GLOVE) ×1
GLOVE BIOGEL PI INDICATOR 6.5 (GLOVE) ×1
GLOVE BIOGEL PI INDICATOR 8 (GLOVE) ×1
GLOVE BIOGEL PI MICRO 5.5 (GLOVE) ×1
GLOVE BIOGEL PI MICRO STRL 5.5 (GLOVE) ×1 IMPLANT
GOWN STRL REUS W/ TWL LRG LVL3 (GOWN DISPOSABLE) ×2 IMPLANT
GOWN STRL REUS W/TWL LRG LVL3 (GOWN DISPOSABLE) ×6
HANDLE SUCTION POOLE (INSTRUMENTS) ×1 IMPLANT
KIT BASIN OR (CUSTOM PROCEDURE TRAY) ×2 IMPLANT
KIT TURNOVER KIT B (KITS) ×2 IMPLANT
LIGASURE IMPACT 36 18CM CVD LR (INSTRUMENTS) ×1 IMPLANT
NS IRRIG 1000ML POUR BTL (IV SOLUTION) ×4 IMPLANT
PACK GENERAL/GYN (CUSTOM PROCEDURE TRAY) ×2 IMPLANT
PAD ARMBOARD 7.5X6 YLW CONV (MISCELLANEOUS) ×2 IMPLANT
PENCIL SMOKE EVACUATOR (MISCELLANEOUS) ×1 IMPLANT
RELOAD PROXIMATE 75MM BLUE (ENDOMECHANICALS) ×2 IMPLANT
RELOAD STAPLE 75 3.8 BLU REG (ENDOMECHANICALS) IMPLANT
SLEEVE SUCTION CATH 165 (SLEEVE) ×2 IMPLANT
SPECIMEN JAR LARGE (MISCELLANEOUS) IMPLANT
SPONGE T-LAP 18X18 ~~LOC~~+RFID (SPONGE) IMPLANT
STAPLER PROXIMATE 75MM BLUE (STAPLE) ×1 IMPLANT
STAPLER VISISTAT 35W (STAPLE) IMPLANT
SUCTION POOLE HANDLE (INSTRUMENTS) ×2
SUT ETHILON 2 0 FS 18 (SUTURE) ×1 IMPLANT
SUT MNCRL AB 4-0 PS2 18 (SUTURE) ×2 IMPLANT
SUT PDS AB 1 TP1 96 (SUTURE) ×4 IMPLANT
SUT SILK 2 0 SH CR/8 (SUTURE) ×2 IMPLANT
SUT SILK 2 0 TIES 10X30 (SUTURE) ×2 IMPLANT
SUT SILK 3 0 SH CR/8 (SUTURE) ×3 IMPLANT
SUT SILK 3 0 TIES 10X30 (SUTURE) ×2 IMPLANT
SUT VIC AB 3-0 SH 18 (SUTURE) ×1 IMPLANT
SUT VIC AB 3-0 SH 27 (SUTURE)
SUT VIC AB 3-0 SH 27XBRD (SUTURE) ×2 IMPLANT
SWAB CULTURE ESWAB REG 1ML (MISCELLANEOUS) ×1 IMPLANT
SWAB CULTURE LIQ STUART DBL (MISCELLANEOUS) ×1 IMPLANT
TOWEL GREEN STERILE (TOWEL DISPOSABLE) ×2 IMPLANT
TRAY FOLEY MTR SLVR 16FR STAT (SET/KITS/TRAYS/PACK) IMPLANT
YANKAUER SUCT BULB TIP NO VENT (SUCTIONS) ×1 IMPLANT

## 2021-12-15 NOTE — Anesthesia Procedure Notes (Signed)
Procedure Name: Intubation ?Date/Time: 12/15/2021 10:28 AM ?Performed by: Eligha Bridegroom, CRNA ?Pre-anesthesia Checklist: Patient identified, Emergency Drugs available, Suction available, Patient being monitored and Timeout performed ?Patient Re-evaluated:Patient Re-evaluated prior to induction ?Oxygen Delivery Method: Circle system utilized ?Induction Type: IV induction and Rapid sequence ?Laryngoscope Size: Mac and 4 ?Grade View: Grade II ?Tube type: Oral ?Tube size: 7.0 mm ?Airway Equipment and Method: Stylet ?Placement Confirmation: ETT inserted through vocal cords under direct vision, positive ETCO2 and breath sounds checked- equal and bilateral ?Secured at: 21 cm ?Tube secured with: Tape ?Dental Injury: Teeth and Oropharynx as per pre-operative assessment  ? ? ? ? ?

## 2021-12-15 NOTE — Progress Notes (Signed)
[facility] ?Progress Note ? ?   ?Subjective: ?CC-  ?Continues to have mostly right sided abdominal pain. No n/v.  ?CT yesterday shows a large interloop abscess and persistent collections in the pelvis. Patient nervous about surgery but ready to proceed today. ? ?Objective: ?Vital signs in last 24 hours: ?Temp:  [98.4 ?F (36.9 ?C)-98.7 ?F (37.1 ?C)] 98.7 ?F (37.1 ?C) (04/12 0407) ?Pulse Rate:  [78-89] 85 (04/12 0407) ?Resp:  [16-18] 16 (04/12 0407) ?BP: (112-142)/(52-64) 112/54 (04/12 0407) ?SpO2:  [91 %-95 %] 95 % (04/12 0407) ?Last BM Date : 12/14/21 ? ?Intake/Output from previous day: ?04/11 0701 - 04/12 0700 ?In: 220 [P.O.:220] ?Out: 90 [Drains:90] ?Intake/Output this shift: ?No intake/output data recorded. ? ?PE: ?Gen: Alert, NAD ?GI: Soft, TTP around drains and more on the right hemiabdomen without peritonitis; TG drain feculent, superior anterior drain with trace serous fluid, inferior anterior drain with trace serous fluid  ? ?Lab Results:  ?Recent Labs  ?  12/14/21 ?0041 12/15/21 ?0209  ?WBC 14.7* 12.9*  ?HGB 8.1* 8.1*  ?HCT 25.1* 25.1*  ?PLT 590* 590*  ? ?BMET ?Recent Labs  ?  12/14/21 ?0041 12/15/21 ?0209  ?NA 136 137  ?K 3.5 3.8  ?CL 99 99  ?CO2 30 31  ?GLUCOSE 104* 91  ?BUN 7 5*  ?CREATININE 0.40* 0.44  ?CALCIUM 8.5* 8.5*  ? ?PT/INR ?No results for input(s): LABPROT, INR in the last 72 hours. ?CMP  ?   ?Component Value Date/Time  ? NA 137 12/15/2021 0209  ? K 3.8 12/15/2021 0209  ? CL 99 12/15/2021 0209  ? CO2 31 12/15/2021 0209  ? GLUCOSE 91 12/15/2021 0209  ? BUN 5 (L) 12/15/2021 0209  ? CREATININE 0.44 12/15/2021 0209  ? CALCIUM 8.5 (L) 12/15/2021 0209  ? GFRNONAA >60 12/15/2021 0209  ? GFRAA  02/27/2010 2026  ?  >60        ?The eGFR has been calculated ?using the MDRD equation. ?This calculation has not been ?validated in all clinical ?situations. ?eGFR's persistently ?<60 mL/min signify ?possible Chronic Kidney Disease.  ? ?Lipase  ?No results found for:  LIPASE ? ? ? ? ?Studies/Results: ?CT ABDOMEN PELVIS W CONTRAST ? ?Result Date: 12/14/2021 ?CLINICAL DATA:  Diverticulitis complicated by perforation and abscesses EXAM: CT ABDOMEN AND PELVIS WITH CONTRAST TECHNIQUE: Multidetector CT imaging of the abdomen and pelvis was performed using the standard protocol following bolus administration of intravenous contrast. RADIATION DOSE REDUCTION: This exam was performed according to the departmental dose-optimization program which includes automated exposure control, adjustment of the mA and/or kV according to patient size and/or use of iterative reconstruction technique. CONTRAST:  176m OMNIPAQUE IOHEXOL 300 MG/ML SOLN, additional oral enteric contrast COMPARISON:  12/05/2021 FINDINGS: Lower chest: Scarring or atelectasis of the bilateral lung bases, right greater than left. Trace right pleural effusion, similar to prior. Hepatobiliary: No solid liver abnormality is seen. No gallstones, gallbladder wall thickening, or biliary dilatation. Unchanged fluid collection in the hepatorenal fossa measuring 4.2 x 2.7 cm (series 3, image 27). Interval placement of a pigtail drainage catheter within a previously seen collection at the tip of the right lobe of the liver, which is now completely resolved (series 3, image 52). Pancreas: Unremarkable. No pancreatic ductal dilatation or surrounding inflammatory changes. Spleen: Normal in size without significant abnormality. Adrenals/Urinary Tract: Adrenal glands are unremarkable. Mild bilateral hydronephrosis and hydroureter, similar to prior examination, presumably secondary to obstruction of the distal ureters by fluid collections in the low pelvis. Bladder is unremarkable. Stomach/Bowel:  Stomach is within normal limits. Descending and sigmoid diverticulosis. Redemonstrated circumferential wall thickening of the mid to distal sigmoid colon (series 3, image 69). There are are multiple, probably contiguous air and fluid collections in the  low pelvis, which are increased in volume compared to prior examination. In particular, a low left pelvic air and fluid collection measures 8.2 x 5.3 cm and contains a pigtail drainage catheter, previously more decompressed, measuring approximately 5.6 x 3.2 cm (series 3, image 72). Low right hemipelvic air and fluid collection measures 6.0 x 3.1 cm, previously 3.8 x 2.0 cm (series 3, image 71). An additional anterior approach pigtail drainage catheter is positioned anterior to this collection (series 3, image 73). Unchanged large interloop fluid collection in the central small bowel mesentery measuring 12.6 x 11.0 cm (series 3, image 54). Vascular/Lymphatic: Aortic atherosclerosis. No enlarged abdominal or pelvic lymph nodes. Reproductive: No mass or other significant abnormality. Other: No abdominal wall hernia or abnormality.  Anasarca. Musculoskeletal: No acute or significant osseous findings. IMPRESSION: 1. Redemonstrated circumferential wall thickening of the mid to distal sigmoid colon, in keeping with diverticulitis. 2. There are multiple, probably contiguous air and fluid collections in the low pelvis, which are increased in volume compared to prior examination. Two pigtail drainage catheters are positioned in the pelvis. 3. Unchanged large interloop fluid collection in the central small bowel mesentery. 4. Interval placement of a pigtail drainage catheter within a previously seen collection at the tip of the right lobe of the liver, which is now completely resolved. 5. Additional smaller fluid collections are unchanged. 6. Mild bilateral hydronephrosis and hydroureter, similar to prior examination, presumably secondary to obstruction of the distal ureters by fluid collections in the low pelvis. 7. Anasarca. Aortic Atherosclerosis (ICD10-I70.0). Electronically Signed   By: Delanna Ahmadi M.D.   On: 12/14/2021 13:10   ? ?Anti-infectives: ?Anti-infectives (From admission, onward)  ? ? Start     Dose/Rate Route  Frequency Ordered Stop  ? 12/08/21 1500  piperacillin-tazobactam (ZOSYN) IVPB 3.375 g       ? 3.375 g ?12.5 mL/hr over 240 Minutes Intravenous Every 8 hours 12/08/21 1414    ? 11/27/21 2300  piperacillin-tazobactam (ZOSYN) IVPB 3.375 g       ? 3.375 g ?12.5 mL/hr over 240 Minutes Intravenous Every 8 hours 11/27/21 2202 12/04/21 2025  ? ?  ? ? ? ?Assessment/Plan ?Sigmoid diverticulitis with pelvic abscesses ?- S/P IR drainage x 2 3/26.  ?- CT 4/2: decompression of previously drained collections, complex R flank collection increased in size; s/p IR drainage 4/4 ?- Cxs from 3/26 with klebsiella/ pseudomonas/ enterococcus; Cx from 4/4 with E.coli/ Klebsiella/ Strep constellatus - currently on IV zosyn ?- CT 4/11 shows a large interloop abscess and persistent collections in the pelvis ?- Patient has failed conservative management with antibiotics and multiple drains. Recommend proceeding with sigmoid colectomy with colostomy. The procedure was discussed at length with the patient. Risks discussed including, but not limited to, anesthesia, pain, bleeding, infection, damage to surrounding structures, leak from anastomosis, need for additional procedures, need for stoma which may be permanent, worsening of pre-existing medical conditions, hernia, recurrence, pneumonia, heart attack, stroke, death). The patient's questions were answered to their satisfaction, they voiced understanding and elected to proceed with surgery. Additionally, we discussed typical postoperative expectations and the recovery process. ?  ?FEN: IVF_0 /hr, NPO ?ID: Zosyn 3/25>>  ?VTE: SCD's, lovenox ?Foley: none ?  ?Small bilateral pleural effusions with atelectasis ?Bipolar disorder, home meds.  ?Tobacco abuse ?  ?  I reviewed last 24 h vitals and pain scores, last 48 h intake and output, last 24 h labs and trends (including CBC and BMP). I personally reviewed images from CT abdomen/ pelvis yesterday.  ? ? ? LOS: 18 days  ? ? ?Wellington Hampshire,  PA-C ?[facility] ?12/15/2021, 7:51 AM ?Please see Amion for pager number during day hours 7:00am-4:30pm ? ?

## 2021-12-15 NOTE — Progress Notes (Signed)
Physical Therapy Treatment ?Patient Details ?Name: Judith Miller ?MRN: [id number] ?DOB: 06/12/1961 ?Today's Date: 12/15/2021 ? ? ?History of Present Illness 61 y.o. female presents to [facility] 11/27/2021 as a transfer from [facility] with perforated sigmoid colon. Pt underwent CT guided pelvic drain placement on 3/26. PMH includes bipolar disorder. ? ?  ?PT Comments  ? ? Pt received supine and initially agreeable to OOB ambulation. Pt requiring min assist to come to sitting EOB for elevation of trunk secondary to increased abdominal pain. Once seated EOB pt tearful expressing concerns and stressors prior to surgery today, provided listening ear and assisted with operation of smartphone on pt request. Pt declining further mobility, able to static sit without LOB on EOB for >89mins.  Pt continues to benefit from skilled PT services to progress toward functional mobility goals.  ?  ?Recommendations for follow up therapy are one component of a multi-disciplinary discharge planning process, led by the attending physician.  Recommendations may be updated based on patient status, additional functional criteria and insurance authorization. ? ?Follow Up Recommendations ? Home health PT ?  ?  ?Assistance Recommended at Discharge Intermittent Supervision/Assistance  ?Patient can return home with the following [name];[name];Assistance with cooking/housework;Assist for transportation;[name] ?  ?Equipment Recommendations ? Rolling walker (2 wheels)  ?  ?Recommendations for Other Services   ? ? ?  ?Precautions / Restrictions Precautions ?Precautions: Fall ?Precaution Comments: JP drain x 3 ?Restrictions ?Weight Bearing Restrictions: No ?Other Position/Activity Restrictions: skin change on sacral area  ?  ? ?Mobility ? Bed Mobility ?Overal bed mobility: Needs Assistance ?Bed Mobility: Rolling ?Rolling: Min  guard ?Sidelying to sit: Min assist ?  ?  ?  ?General bed mobility comments: assist for cues to come to sidelying and toelevate trunk to sitting secondary to increased pain ?  ? ?Transfers ?Overall transfer level: Needs assistance ?  ?  ?  ?  ?  ?  ?  ?  ?General transfer comment: declined OOB ?  ? ?Ambulation/Gait ?  ?  ?  ?  ?  ?  ?  ?  ? ? ?Stairs ?  ?  ?  ?  ?  ? ? ?Wheelchair Mobility ?  ? ?Modified Rankin (Stroke Patients Only) ?  ? ? ?  ?Balance   ?  ?  ?  ?  ?  ?  ?  ?  ?  ?  ?  ?  ?  ?  ?  ?  ?  ?  ?  ? ?  ?Cognition Arousal/Alertness: Awake/alert ?Behavior During Therapy: [facility] for tasks assessed/performed ?Overall Cognitive Status: Within Functional Limits for tasks assessed ?  ?  ?  ?  ?  ?  ?  ?  ?  ?  ?  ?  ?  ?  ?  ?  ?  ?  ?  ? ?  ?Exercises General Exercises - Lower Extremity ?Ankle Circles/Pumps: AROM, 5 reps ?Other Exercises ?Other Exercises: static sitting EOB x 10 mins ? ?  ?General Comments   ?  ?  ? ?Pertinent Vitals/Pain Pain Assessment ?Pain Assessment: Faces ?Faces Pain Scale: Hurts even more ?Pain Location: abdomen, buttocks ?Pain Descriptors / Indicators: Guarding, Grimacing ?Pain Intervention(s): Limited activity within patient's tolerance, Monitored during session, Repositioned  ? ? ?Home Living   ?  ?  ?  ?  ?  ?  ?  ?  ?  ?   ?  ?  Prior Function    ?  ?  ?   ? ?PT Goals (current goals can now be found in the care plan section) Acute Rehab PT Goals ?Patient Stated Goal: go back to work ?PT Goal Formulation: With patient ?Time For Goal Achievement: 12/15/21 ? ?  ?Frequency ? ? ? Min 3X/week ? ? ? ?  ?PT Plan Current plan remains appropriate  ? ? ?Co-evaluation   ?  ?  ?  ?  ? ?  ?AM-PAC PT "6 Clicks" Mobility   ?Outcome Measure ? Help needed turning from your back to your side while in a flat bed without using bedrails?: A Little ?Help needed moving from lying on your back to sitting on the side of a flat bed without using bedrails?: A Little ?Help needed moving to and from a bed to a  chair (including a wheelchair)?: A Little ?Help needed standing up from a chair using your arms (e.g., wheelchair or bedside chair)?: A Little ?Help needed to walk in hospital room?: A Little ?Help needed climbing 3-5 steps with a railing? : A Lot ?6 Click Score: 17 ? ?  ?End of Session   ?Activity Tolerance: Patient tolerated treatment well ?Patient left: in bed;with call bell/phone within reach;with bed alarm set (seated EOB) ?Nurse Communication: Mobility status ?PT Visit Diagnosis: Other abnormalities of gait and mobility (R26.89);Muscle weakness (generalized) (M62.81);Pain ?Pain - Right/Left:  (abdomen) ?Pain - part of body:  (abdomen) ?  ? ? ?Time: 9628-3662 ?PT Time Calculation (min) (ACUTE ONLY): 14 min ? ?Charges:  $Therapeutic Activity: 8-22 mins          ?          ? ?Lenora Boys. PTA ?Acute Rehabilitation Services ?Office: [phone] ? ? ? ?Marlana Salvage Crystalynn Mcinerney ?12/15/2021, 9:02 AM ? ?

## 2021-12-15 NOTE — Transfer of Care (Signed)
Immediate Anesthesia Transfer of Care Note ? ?Patient: OLIWIA KOSIN ? ?Procedure(s) Performed: EXPLORATORY LAPAROTOMY (Abdomen) ?SMALL BOWEL RESECTION (Abdomen) ?TRANSVERSE LOOP COLOSTOMY (Abdomen) ? ?Patient Location: PACU ? ?Anesthesia Type:General ? ?Level of Consciousness: awake and alert  ? ?Airway & Oxygen Therapy: Patient Spontanous Breathing and Patient connected to nasal cannula oxygen ? ?Post-op Assessment: Report given to RN and Post -op Vital signs reviewed and stable ? ?Post vital signs: Reviewed and stable ? ?Last Vitals:  ?Vitals Value Taken Time  ?BP 148/55 12/15/21 1341  ?Temp    ?Pulse 90 12/15/21 1345  ?Resp 23 12/15/21 1345  ?SpO2 97 % 12/15/21 1345  ?Vitals shown include unvalidated device data. ? ?Last Pain:  ?Vitals:  ? 12/15/21 0947  ?TempSrc: Oral  ?PainSc: 8   ?   ? ?Patients Stated Pain Goal: 0 (12/14/21 0830) ? ?Complications: No notable events documented. ?

## 2021-12-15 NOTE — Progress Notes (Addendum)
PICC consult: PICC insertion procedure for TPN discussed with patient. She declined PICC placement tonight. Stated she'll "see how it goes in the morning." Risks and benefits reviewed. She denied any questions at this time. Pt is S/P anesthesia at 1345. IV Team will assess on 4/13. ?

## 2021-12-15 NOTE — Progress Notes (Signed)
Mobility Specialist Progress Note: ? ? 12/15/21 1029  ?Mobility  ?Activity Off unit  ? ?Will follow-up as time allows.  ? ?Miraj Truss ?Mobility Specialist ?Primary Phone [phone] ? ?

## 2021-12-15 NOTE — Op Note (Signed)
Date: 12/15/21 ? ?Patient: Duncan DullKaren F Wissner ?MRN: [id number] ? ?Preoperative Diagnosis: Perforated sigmoid diverticulitis ?Postoperative Diagnosis: Perforated sigmoid diverticulitis, multiple intraabdominal abscesses ? ?Procedure: Exploratory laparotomy, lysis of adhesions, drainage of intraabdominal abscesses, small bowel resection with primary reanastomosis, diverting transverse loop colostomy ? ?Surgeon: Sophronia SimasShelby Jolon Degante, MD ?Assistant: Violeta GelinasBurke Thompson, MD; Phylliss Blakeshelsea Connor, MD ? ?EBL: 50 mL ? ?Anesthesia: General endotracheal ? ?Specimens: Small bowel; intraabdominal abscess (culture) ? ?Indications: [name] is a 61 yo female who initially presented to a hospital in [place] about 2 weeks ago with persistent abdominal pain and leukocytosis, and a CT scan showed sigmoid colon inflammation with multiple intraabdominal fluid collections. [facility] was [facility] services, and underwent placement of a percutaneous drain. She has not had significant improvement and has had 2 further drains placed. She has had recurrent abdominal pain and risking WBC in spite of IV antibiotics. A new CT scan this week showed enlarging pelvic fluid collections and persistent interloop abscesses within the small bowel. Surgical exploration with a sigmoid colectomy and end colostomy was recommended, and the patient has agreed to proceed. She was marked for a stoma site preoperatively by the wound care team. ? ?Findings: Significant inflammatory adhesions involving the majority of the small bowel, with multiple interloop abscesses. Large abscess within the pelvis, with significant thickening and inflammation of the distal sigmoid colon and upper rectum and dense surrounding adhesions. The pelvis was essentially frozen, making safe dissection extremely difficult, thus the decision was made to abort the planned colon resection and perform a diverting colostomy. ? ?Procedure details: Informed consent was obtained in the  preoperative area prior to the procedure. The patient was brought to the operating room and placed on the table in the supine position. General anesthesia was induced and appropriate lines and drains were placed for intraoperative monitoring. Perioperative antibiotics were administered per SCIP guidelines. The abdomen was prepped and draped in the usual sterile fashion. A pre-procedure timeout was taken verifying patient identity, surgical site and procedure to be performed. ? ?The percutaneous drain going through the anterior abdominal wall at the lower midline was removed.  A lower midline skin incision was made and extended above the umbilicus. The subcutaneous tissue was divided with cautery to expose the fascia. The fascia was opened along the linea alba and the peritoneal cavity was entered. The small bowel was diffusely mildly dilated and inflamed but there was no large volume fluid or succus encountered on opening the peritoneum. The small bowel was diffusely adherent to the anterior abdominal wall. The small bowel was carefully taken off the abdominal wall using a combination of sharp dissection and very gentle blunt dissection.  Once the abdominal wall was clear, a Bookwalter fixed retractor was placed.  The descending colon and sigmoid colon were visualized.  The descending and proximal sigmoid colon were soft and normal in appearance, however the distal sigmoid colon and upper rectum were very thickened and inflamed.  The remainder of the rectum was very adherent within the pelvis.  There were numerous thick inflammatory adhesions within the small bowel.  These adhesions were very carefully lysed using sharp and blunt dissection which was a very tedious process.  Two interloop abscesses were encountered within the small bowel, and a culture was sent from one of these collections.  An abscess in the right upper quadrant adjacent to the inferior right liver was also encountered, with one of the  percutaneous drains within this collection.  This was drained and the percutaneous  drain was removed.  During the adhesiolysis, a small full-thickness enterotomy was made on the small bowel, with a nearby serosal tear.   ? ?Next, attempts were made to dissect out the pelvis.  There was a loop of small bowel that was densely adherent to the inflammatory tissue within the pelvis.  We attempted to sharply lyse these adhesions to fully mobilize the loop of small bowel, but the adhesions were very thick without clear tissue planes.  The sigmoid colon was able to be partially mobilized by bluntly dissecting it off the left pelvic sidewall.  On mobilizing the sigmoid colon medially, another abscess was encountered deep in the pelvis, and the tip of the transgluteal drain was palpable within this collection.  We attempted to further dissect out the distal sigmoid and the upper rectum to determine where the colon would need to be divided to perform a colon resection.  However the pelvis was full of dense inflammatory tissue with no clear tissue planes.  At this point, we felt that further attempts to dissect out the upper rectum would risk significant injury to other structures.  Thus my partner and I agreed that the safest option for the patient was to perform a diverting colostomy to achieve source control. ? ?The loop of small bowel with the enterotomy was closely examined.  This loop was mobilized to allow resection of the enterotomy and the adjacent serosal tear by sharply lysing the interloop adhesions.  The bowel was transected proximal and distal to this area using a GIA 75 mm stapler with blue loads.  The intervening mesentery was thickened and inflamed, and was divided with LigaSure.  The cut surface of the mesentery appeared hemostatic.  The resected segment of small bowel, approximately 30 cm in length, was passed off the field and sent for routine pathology.  The resected segment appeared to be from the mid  small bowel, however the exact location was unclear because much of the small bowel remained fixed within inflammatory adhesions.  The two stapled ends of the small bowel were then laid next to each other and a 3-0 silk stay suture was placed.  An enterotomy was made on the antimesenteric border of each end of the small bowel, and a side-to-side antiperistaltic stapled anastomosis was performed using a 75 mm GIA stapler with a blue load.  The common enterotomy was closed with a TA 60 B stapler.  The anastomosis was palpated and was widely patent.  The mesenteric defect was closed with 3-0 silk figure-of-eight sutures.  The remainder of the small bowel was examined and there were no other signs of injury.  The abdomen was copiously irrigated with warm saline. ? ?The sigmoid colon mesentery was too inflamed and thickened to allow a descending loop colostomy to reach the abdominal wall, thus the decision was made to perform a transverse loop colostomy.  A circular skin incision was made in the left upper quadrant at the site that was marked preoperatively.  The subcutaneous tissue was divided to expose the fascia.  The anterior rectus sheath was incised, the rectus muscle was spread, and the posterior sheath was incised.  A loop of transverse colon was brought up through the abdominal wall defect to the skin.  A red rubber catheter was placed underneath the loop to help secure it to the skin.  The retractors were then removed.  A 19 [place] JP drain was placed in the pelvis and brought out through the right lower quadrant abdominal wall, and  secured to the skin with a 2-0 nylon suture.  The fascia was closed at midline with a running looped 1 PDS suture, and the skin was left open.  The loop colostomy was matured with interrupted 3-0 Vicryl sutures.  The red rubber catheter was left in place and secured to itself with 2-0 silk sutures.  A colostomy appliance was placed.  The skin at the midline incision was packed with  saline moistened Kerlix and covered with a clean dressing. ? ?The patient tolerated the procedure with no apparent complications. All counts were correct x2 at the end of the procedure. The patient was extubated and taken

## 2021-12-15 NOTE — Consult Note (Signed)
Consult requested for a new colostomy.  Pt is in the perioperative setting and I will plan to see her tomorrow. ?Cammie Mcgee MSN, RN, CWOCN, CWCN-AP, CNS ?[phone]  ?

## 2021-12-15 NOTE — Progress Notes (Signed)
PHARMACY - TOTAL PARENTERAL NUTRITION CONSULT NOTE ? ?Indication: Prolonged ileus ? ?Patient Measurements: ?Height: 5\' 4"  (162.6 cm) ?Weight: 82.6 kg (182 lb) ?IBW/kg (Calculated) : 54.7 ?TPN AdjBW (KG): 61.7 ?Body mass index is 31.24 kg/m?. ? ?Assessment:  ?30 YOF presented on 3/25 with pelvic abscess 2/2 sigmoid colon perforation, intra-abdominal fluid collections and ileus.  IR placed pelvic drains x2 on 3/26.  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  No clinical improvement and repeat CT on 4/11 shows large abscess and persistent pelvic fluid collection.  Planning sigmoid colectomy with colostomy on 4/12.  Pharmacy consulted to manage TPN. ? ?Patient was NPO on admit, then advanced to CLD on 3/29, FLD on 3/31 and soft diet on 4/3.  Per RD documentation, intake was 50-80% of needs.   ? ?Glucose / Insulin: no hx DM - glucose < 180 on AM labs ?Electrolytes: K 3.8 (goal >/= 4), others WNL ?Renal: SCr < 1, BUN WNL ?Hepatic:  ?Intake / Output; MIVF: drain 77mL, NS 75 ml/hr ?GI Imaging: none since TPN start ?GI Surgeries / Procedures: none since TPN start ? ?Central access: pending ?TPN start date: 12/16/21 ? ?Nutritional Goals:  RD Estimated Needs ?Total Energy Estimated Needs: 1800-2000 ?Total Protein Estimated Needs: 90-100 ?Total Fluid Estimated Needs: >/= 2 L ? ?Current Nutrition:  ?NPO ? ?Plan:  ?Start TPN 4/13 as consult received after hours ?NS at 75 ml/hr per MD ?KCL x 2 runs ?Standard TPN labs and nursing care orders ? ?Jillyan Plitt D. Mina Marble, PharmD, BCPS, BCCCP ?12/15/2021, 2:27 PM ? ? ?

## 2021-12-15 NOTE — Plan of Care (Signed)
°  Problem: Safety: °Goal: Ability to remain free from injury will improve °Outcome: Progressing °  °Problem: Pain Managment: °Goal: General experience of comfort will improve °Outcome: Not Progressing °  °

## 2021-12-15 NOTE — Progress Notes (Signed)
Nutrition Follow-up ? ?DOCUMENTATION CODES:  ?Not applicable ? ?INTERVENTION:  ?After surgery, advance as tolerated to GI soft, request easy to chew foods from dining services due to limited dentition. ?Continue Boost Breeze po TID, each supplement provides 250 kcal and 9 grams of protein  ?Continue MVI with minerals daily ?Will provide diet education for new colostomy at follow-up ? ?NUTRITION DIAGNOSIS:  ?Inadequate oral intake related to other (see comment) (restricted diet) as evidenced by NPO status (NPO status x 4 days). ?- remains applicable ? ?GOAL:  ?Patient will meet greater than or equal to 90% of their needs ?- progressing, diet advanced, supplements in place ? ?MONITOR:  ?PO intake, Supplement acceptance, Diet advancement, Labs, Weight trends, I & O's ? ?REASON FOR ASSESSMENT:  ?NPO/Clear Liquid Diet ?  ? ?ASSESSMENT:  ?61 year old female with no relevant medical hx admitted from [facility] after imaging showed a perforated sigmoid colon and pt was in need of radiology interventions. ? ?3/26 - Ct guided drain placement x2 in IR ?4/2 - repeat imaging showed RLQ abscess ?4/4 - additional drain placed in IR ?4/12 - Op, sigmoid colectomy with colostomy ? ?Pt out of room for surgery at the time of assessment. Pt had a decline in oral intake and worsening abdominal pain over the weekend. New imaging showed a large interloop abscess and persistent collections in the pelvis. Surgery recommended proceeding with surgery as pt was not improving with conservative management. Planned for colectomy with end colostomy. WOC following for teaching. Will monitor pt for diet advancement after surgery and will provide diet education on new colostomy diet at follow-up  ? ?Average Meal Intake: ?3/30-4/3: 82% intake x 6 recorded meals ?4/6-4/12: 50% average intake x 6 recorded meals ? ?Nutritionally Relevant Medications: ?Scheduled Meds: ? Boost Breeze  1 Container Oral TID BM  ? multivitamin with minerals  1 tablet Oral  Daily  ? ?Continuous Infusions: ? sodium chloride 75 mL/hr at 12/15/21 2595  ? piperacillin-tazobactam (ZOSYN)  IV 3.375 g (12/15/21 6387)  ? ?PRN Meds: alum & mag hydroxide-simeth, liver oil-zinc oxide, ondansetron ? ?Labs Reviewed  ? ?NUTRITION - FOCUSED PHYSICAL EXAM: ?Flowsheet Row Most Recent Value  ?Orbital Region No depletion  ?Upper Arm Region No depletion  ?Thoracic and Lumbar Region No depletion  ?Buccal Region No depletion  ?Temple Region Mild depletion  ?Clavicle Bone Region No depletion  ?Clavicle and Acromion Bone Region No depletion  ?Scapular Bone Region Mild depletion  ?Dorsal Hand No depletion  ?Patellar Region No depletion  ?Anterior Thigh Region Mild depletion  ?Posterior Calf Region Mild depletion  ?Edema (RD Assessment) None  ?Hair Reviewed  ?Eyes Reviewed  ?Mouth Reviewed  ?Skin Reviewed  ?Nails Reviewed  ? ?Diet Order:   ?Diet Order   ? ?       ?  Diet NPO time specified  Diet effective midnight       ?  ? ?  ?  ? ?  ? ? ?EDUCATION NEEDS:  ?Education needs have been addressed ? ?Skin:  Skin Assessment: Reviewed RN Assessment ? ?Last BM:  4/11 ? ?Height:  ?Ht Readings from Last 1 Encounters:  ?12/02/21 5\' 4"  (1.626 m)  ? ?Weight:  ?Wt Readings from Last 1 Encounters:  ?12/02/21 82.6 kg  ? ? ?Ideal Body Weight:  54.5 kg ? ?BMI:  Body mass index is 31.24 kg/m?. ? ?Estimated Nutritional Needs:  ?Kcal:  1800-2000 ?Protein:  90-100 ?Fluid:  >/= 2 L ? ? ?12/04/21, RD, LDN ?Clinical  Dietitian ?RD pager # available in [place]  ?After hours/weekend pager # available in [place] ?

## 2021-12-15 NOTE — Anesthesia Preprocedure Evaluation (Signed)
Anesthesia Evaluation  ?Patient identified by MRN, date of birth, ID band ?Patient awake ? ? ? ?Reviewed: ?Allergy & Precautions, NPO status , Patient's Chart, lab work & pertinent test results ? ?Airway ?Mallampati: II ? ?TM Distance: >3 FB ?Neck ROM: Full ? ? ? Dental ?no notable dental hx. ? ?  ?Pulmonary ?neg pulmonary ROS, Current Smoker and Patient abstained from smoking.,  ?  ?Pulmonary exam normal ?breath sounds clear to auscultation ? ? ? ? ? ? Cardiovascular ?negative cardio ROS ?Normal cardiovascular exam ?Rhythm:Regular Rate:Normal ? ? ?  ?Neuro/Psych ?Bipolar Disorder negative neurological ROS ? negative psych ROS  ? GI/Hepatic ?negative GI ROS, Neg liver ROS,   ?Endo/Other  ?negative endocrine ROS ? Renal/GU ?negative Renal ROS  ?negative genitourinary ?  ?Musculoskeletal ?negative musculoskeletal ROS ?(+)  ? Abdominal ?  ?Peds ?negative pediatric ROS ?(+)  Hematology ?negative hematology ROS ?(+)   ?Anesthesia Other Findings ? ? Reproductive/Obstetrics ?negative OB ROS ? ?  ? ? ? ? ? ? ? ? ? ? ? ? ? ?  ?  ? ? ? ? ? ? ? ? ?Anesthesia Physical ?Anesthesia Plan ? ?ASA: 3 ? ?Anesthesia Plan: General  ? ?Post-op Pain Management:   ? ?Induction: Intravenous ? ?PONV Risk Score and Plan: 2 and Ondansetron, Midazolam and Treatment may vary due to age or medical condition ? ?Airway Management Planned: Oral ETT ? ?Additional Equipment:  ? ?Intra-op Plan:  ? ?Post-operative Plan: Extubation in OR ? ?Informed Consent: I have reviewed the patients History and Physical, chart, labs and discussed the procedure including the risks, benefits and alternatives for the proposed anesthesia with the patient or authorized representative who has indicated his/her understanding and acceptance.  ? ? ? ?Dental advisory given ? ?Plan Discussed with: CRNA ? ?Anesthesia Plan Comments:   ? ? ? ? ? ? ?Anesthesia Quick Evaluation ? ?

## 2021-12-16 ENCOUNTER — Encounter (HOSPITAL_COMMUNITY): Payer: Self-pay | Admitting: Surgery

## 2021-12-16 LAB — CBC
HCT: 27.4 % — ABNORMAL LOW (ref 36.0–46.0)
Hemoglobin: 9.1 g/dL — ABNORMAL LOW (ref 12.0–15.0)
MCH: 28 pg (ref 26.0–34.0)
MCHC: 33.2 g/dL (ref 30.0–36.0)
MCV: 84.3 fL (ref 80.0–100.0)
Platelets: 597 10*3/uL — ABNORMAL HIGH (ref 150–400)
RBC: 3.25 MIL/uL — ABNORMAL LOW (ref 3.87–5.11)
RDW: 15.1 % (ref 11.5–15.5)
WBC: 20.2 10*3/uL — ABNORMAL HIGH (ref 4.0–10.5)
nRBC: 0 % (ref 0.0–0.2)

## 2021-12-16 LAB — COMPREHENSIVE METABOLIC PANEL
ALT: 16 U/L (ref 0–44)
AST: 15 U/L (ref 15–41)
Albumin: <1.5 g/dL — ABNORMAL LOW (ref 3.5–5.0)
Alkaline Phosphatase: 52 U/L (ref 38–126)
Anion gap: 4 — ABNORMAL LOW (ref 5–15)
BUN: 7 mg/dL (ref 6–20)
CO2: 30 mmol/L (ref 22–32)
Calcium: 8.2 mg/dL — ABNORMAL LOW (ref 8.9–10.3)
Chloride: 103 mmol/L (ref 98–111)
Creatinine, Ser: <0.3 mg/dL — ABNORMAL LOW (ref 0.44–1.00)
Glucose, Bld: 122 mg/dL — ABNORMAL HIGH (ref 70–99)
Potassium: 3.7 mmol/L (ref 3.5–5.1)
Sodium: 137 mmol/L (ref 135–145)
Total Bilirubin: 0.3 mg/dL (ref 0.3–1.2)
Total Protein: 5.5 g/dL — ABNORMAL LOW (ref 6.5–8.1)

## 2021-12-16 LAB — TRIGLYCERIDES: Triglycerides: 98 mg/dL (ref <150)

## 2021-12-16 LAB — HEMOGLOBIN A1C
Hgb A1c MFr Bld: 5.6 % (ref 4.8–5.6)
Mean Plasma Glucose: 114.02 mg/dL

## 2021-12-16 LAB — PHOSPHORUS: Phosphorus: 3.4 mg/dL (ref 2.5–4.6)

## 2021-12-16 LAB — GLUCOSE, CAPILLARY: Glucose-Capillary: 109 mg/dL — ABNORMAL HIGH (ref 70–99)

## 2021-12-16 LAB — MAGNESIUM: Magnesium: 1.8 mg/dL (ref 1.7–2.4)

## 2021-12-16 MED ORDER — SODIUM CHLORIDE 0.9 % IV SOLN: INTRAVENOUS | Status: DC

## 2021-12-16 MED ORDER — SODIUM CHLORIDE 0.9% FLUSH
10.0000 mL | Freq: Two times a day (BID) | INTRAVENOUS | Status: DC
  Administered 2021-12-16 – 2021-12-18 (×4): 10 mL
  Administered 2021-12-19: 20 mL
  Administered 2021-12-20 – 2021-12-28 (×13): 10 mL

## 2021-12-16 MED ORDER — MAGNESIUM SULFATE 2 GM/50ML IV SOLN
2.0000 g | Freq: Once | INTRAVENOUS | Status: AC
  Administered 2021-12-16: 2 g via INTRAVENOUS
  Filled 2021-12-16: qty 50

## 2021-12-16 MED ORDER — SODIUM CHLORIDE 0.9% FLUSH
10.0000 mL | INTRAVENOUS | Status: DC | PRN
  Administered 2021-12-25: 10 mL

## 2021-12-16 MED ORDER — KETOROLAC TROMETHAMINE 15 MG/ML IJ SOLN
15.0000 mg | Freq: Three times a day (TID) | INTRAMUSCULAR | Status: AC
  Administered 2021-12-16 – 2021-12-17 (×3): 15 mg via INTRAVENOUS
  Filled 2021-12-16 (×4): qty 1

## 2021-12-16 MED ORDER — TRAVASOL 10 % IV SOLN
INTRAVENOUS | Status: AC
  Filled 2021-12-16: qty 636

## 2021-12-16 MED ORDER — PANTOPRAZOLE SODIUM 40 MG IV SOLR
40.0000 mg | Freq: Two times a day (BID) | INTRAVENOUS | Status: DC
  Administered 2021-12-16 – 2021-12-22 (×12): 40 mg via INTRAVENOUS
  Filled 2021-12-16 (×12): qty 10

## 2021-12-16 MED ORDER — POTASSIUM CHLORIDE 10 MEQ/100ML IV SOLN
10.0000 meq | INTRAVENOUS | Status: AC
  Administered 2021-12-16 (×4): 10 meq via INTRAVENOUS
  Filled 2021-12-16 (×4): qty 100

## 2021-12-16 MED ORDER — PHENOL 1.4 % MT LIQD
1.0000 | OROMUCOSAL | Status: DC | PRN
  Administered 2021-12-16: 1 via OROMUCOSAL
  Filled 2021-12-16: qty 177

## 2021-12-16 MED ORDER — HYDROMORPHONE HCL 1 MG/ML IJ SOLN
0.5000 mg | INTRAMUSCULAR | Status: DC | PRN
  Administered 2021-12-16 – 2021-12-19 (×22): 1 mg via INTRAVENOUS
  Filled 2021-12-16 (×22): qty 1

## 2021-12-16 MED ORDER — TRAVASOL 10 % IV SOLN: INTRAVENOUS | Status: DC

## 2021-12-16 MED ORDER — INSULIN ASPART 100 UNIT/ML IJ SOLN
0.0000 [IU] | Freq: Four times a day (QID) | INTRAMUSCULAR | Status: DC
  Administered 2021-12-17 – 2021-12-19 (×6): 1 [IU] via SUBCUTANEOUS
  Administered 2021-12-19: 2 [IU] via SUBCUTANEOUS
  Administered 2021-12-20 – 2021-12-21 (×4): 1 [IU] via SUBCUTANEOUS

## 2021-12-16 MED ORDER — CHLORHEXIDINE GLUCONATE CLOTH 2 % EX PADS
6.0000 | MEDICATED_PAD | Freq: Every day | CUTANEOUS | Status: DC
  Administered 2021-12-16 – 2021-12-28 (×13): 6 via TOPICAL

## 2021-12-16 NOTE — Progress Notes (Signed)
? ? ?Referring Physician(s): Sophronia Simas  ? ?Supervising Physician: Roanna Banning ? ?Patient Status:  [facility] - In-pt ? ?Chief Complaint: ? ?Pelvic fluid collection, s/p L TG and RLQ drain placement by Dr. Bryn Gulling on 3/26.  ?Additional 14 fr RLQ (exits at RUQ) drain placement on 4/4 with Dr. Milford Cage.  ? ?Patient underwent ex lap, RUQ and RLQ drains were removed by surgery.  ? ?Subjective: ? ?Patient laying in bed, NAD.  RN at the bedside. ?States that she feels rough today after the surgery yesterday. ?Reports abdominal pain around the surgical sites. ?No complaints regarding left transgluteal drain. ? ?Allergies: ?Sulfa antibiotics ? ?Medications: ?Prior to Admission medications   ?Medication Sig Start Date End Date Taking? Authorizing Provider  ?risperiDONE (RISPERDAL) 0.5 MG tablet Take 0.5 mg by mouth in the morning.   Yes [provider]  ?risperiDONE (RISPERDAL) 1 MG tablet Take 1.5 mg by mouth at bedtime.   Yes [provider]  ?vitamin B-12 (CYANOCOBALAMIN) 500 MCG tablet Take 500 mcg by mouth daily.   Yes [provider]  ? ? ? ?Vital Signs: ?BP (!) 145/64 (BP Location: Right Arm)   Pulse 78   Temp 97.8 ?F (36.6 ?C)   Resp 18   Ht 5\' 4"  (1.626 m)   Wt 182 lb (82.6 kg)   SpO2 93%   BMI 31.24 kg/m?  ? ?Physical Exam ?Vitals reviewed.  ?Constitutional:   ?   General: She is not in acute distress. ?   Appearance: Normal appearance. She is not ill-appearing.  ?HENT:  ?   Head: Normocephalic and atraumatic.  ?Pulmonary:  ?   Effort: Pulmonary effort is normal.  ?Abdominal:  ?   Palpations: Abdomen is soft.  ?Musculoskeletal:  ?   Cervical back: Neck supple.  ?Skin: ?   General: Skin is warm and dry.  ?   Coloration: Skin is not jaundiced or pale.  ?   Comments: Drain # 2. Positive left TG drain to suction bulb.  20 mL of feculent/purulent fluid noted in the bulb. Drain aspirates and flushes well.  ? ?  ?Neurological:  ?   Mental Status: She is alert and oriented to person, place, and  time.  ?Psychiatric:     ?   Mood and Affect: Mood normal.     ?   Behavior: Behavior normal.  ? ? ?Imaging: ?CT ABDOMEN PELVIS W CONTRAST ? ?Result Date: 12/14/2021 ?CLINICAL DATA:  Diverticulitis complicated by perforation and abscesses EXAM: CT ABDOMEN AND PELVIS WITH CONTRAST TECHNIQUE: Multidetector CT imaging of the abdomen and pelvis was performed using the standard protocol following bolus administration of intravenous contrast. RADIATION DOSE REDUCTION: This exam was performed according to the departmental dose-optimization program which includes automated exposure control, adjustment of the mA and/or kV according to patient size and/or use of iterative reconstruction technique. CONTRAST:  02/13/2022 OMNIPAQUE IOHEXOL 300 MG/ML SOLN, additional oral enteric contrast COMPARISON:  12/05/2021 FINDINGS: Lower chest: Scarring or atelectasis of the bilateral lung bases, right greater than left. Trace right pleural effusion, similar to prior. Hepatobiliary: No solid liver abnormality is seen. No gallstones, gallbladder wall thickening, or biliary dilatation. Unchanged fluid collection in the hepatorenal fossa measuring 4.2 x 2.7 cm (series 3, image 27). Interval placement of a pigtail drainage catheter within a previously seen collection at the tip of the right lobe of the liver, which is now completely resolved (series 3, image 52). Pancreas: Unremarkable. No pancreatic ductal dilatation or surrounding inflammatory changes. Spleen: Normal in size  without significant abnormality. Adrenals/Urinary Tract: Adrenal glands are unremarkable. Mild bilateral hydronephrosis and hydroureter, similar to prior examination, presumably secondary to obstruction of the distal ureters by fluid collections in the low pelvis. Bladder is unremarkable. Stomach/Bowel: Stomach is within normal limits. Descending and sigmoid diverticulosis. Redemonstrated circumferential wall thickening of the mid to distal sigmoid colon (series 3, image 69).  There are are multiple, probably contiguous air and fluid collections in the low pelvis, which are increased in volume compared to prior examination. In particular, a low left pelvic air and fluid collection measures 8.2 x 5.3 cm and contains a pigtail drainage catheter, previously more decompressed, measuring approximately 5.6 x 3.2 cm (series 3, image 72). Low right hemipelvic air and fluid collection measures 6.0 x 3.1 cm, previously 3.8 x 2.0 cm (series 3, image 71). An additional anterior approach pigtail drainage catheter is positioned anterior to this collection (series 3, image 73). Unchanged large interloop fluid collection in the central small bowel mesentery measuring 12.6 x 11.0 cm (series 3, image 54). Vascular/Lymphatic: Aortic atherosclerosis. No enlarged abdominal or pelvic lymph nodes. Reproductive: No mass or other significant abnormality. Other: No abdominal wall hernia or abnormality.  Anasarca. Musculoskeletal: No acute or significant osseous findings. IMPRESSION: 1. Redemonstrated circumferential wall thickening of the mid to distal sigmoid colon, in keeping with diverticulitis. 2. There are multiple, probably contiguous air and fluid collections in the low pelvis, which are increased in volume compared to prior examination. Two pigtail drainage catheters are positioned in the pelvis. 3. Unchanged large interloop fluid collection in the central small bowel mesentery. 4. Interval placement of a pigtail drainage catheter within a previously seen collection at the tip of the right lobe of the liver, which is now completely resolved. 5. Additional smaller fluid collections are unchanged. 6. Mild bilateral hydronephrosis and hydroureter, similar to prior examination, presumably secondary to obstruction of the distal ureters by fluid collections in the low pelvis. 7. Anasarca. Aortic Atherosclerosis (ICD10-I70.0). Electronically Signed   By: Jearld Lesch M.D.   On: 12/14/2021 13:10  ? ?[place] EKG SITE  RITE ? ?Result Date: 12/15/2021 ?If [employer] not attached, placement could not be confirmed due to current cardiac rhythm.  ? ?Labs: ? ?CBC: ?Recent Labs  ?  12/13/21 ?0144 12/14/21 ?0041 12/15/21 ?6256 12/15/21 ?1102 12/16/21 ?0540  ?WBC 18.2* 14.7* 12.9*  --  20.2*  ?HGB 7.8* 8.1* 8.1* 7.8* 9.1*  ?HCT 24.2* 25.1* 25.1* 23.0* 27.4*  ?PLT 539* 590* 590*  --  597*  ? ? ? ?COAGS: ?Recent Labs  ?  11/28/21 ?1054  ?INR 1.2  ? ? ? ?BMP: ?Recent Labs  ?  12/10/21 ?3893 12/11/21 ?7342 12/14/21 ?0041 12/15/21 ?8768 12/15/21 ?1102 12/16/21 ?0540  ?NA 136  --  136 137 136 137  ?K 3.8  --  3.5 3.8 3.4* 3.7  ?CL 101  --  99 99 97* 103  ?CO2 30  --  30 31  --  30  ?GLUCOSE 96  --  104* 91 97 122*  ?BUN 8  --  7 5* 4* 7  ?CALCIUM 8.5*  --  8.5* 8.5*  --  8.2*  ?CREATININE 0.53 0.49 0.40* 0.44 0.20* <0.30*  ?GFRNONAA >60 >60 >60 >60  --  NOT CALCULATED  ? ? ? ?LIVER FUNCTION TESTS: ?Recent Labs  ?  12/16/21 ?0540  ?BILITOT 0.3  ?AST 15  ?ALT 16  ?ALKPHOS 52  ?PROT 5.5*  ?ALBUMIN <1.5*  ? ? ?Assessment and Plan: ? ?61 y.o.  female with pelvic fluid collection, s/p L TG and RLQ drain placement by Dr. Bryn GullingMir on 3/26, s/p RUQ drain placement with Dr. Milford CageMugweru 4/4. Patient underwent ex lap on 12/15/2021, RUQ and RLQ drains were removed by surgery.  ? ?VSS afebrile ?WBC fluctuating  20.2 today (12.8 yesterday) ?CX from midline KLEBSIELLA PNEUMONIAE, ENTEROCOCCUS FAECIUM  ?OP from 4/12-13 not documented, asked RN to continue to document OP.  ?Output appears to be feculent/purulent ? ? ?Drain #2  Location: left buttocks  ?Size: Fr size: 10 Fr ?Date of placement: 3.26.23  ?Currently to: Drain collection device: suction bulb ? ?24 hour output:  ?Output by Drain (mL) 12/14/21 0701 - 12/14/21 1900 12/14/21 1901 - 12/15/21 0700 12/15/21 0701 - 12/15/21 1900 12/15/21 1901 - 12/16/21 0700 12/16/21 0701 - 12/16/21 1114  ?Closed System Drain 2 Left Buttock Bulb (JP) 10 Fr.  70     ?Closed System Drain 1 Right Abdomen Bulb (JP) 19 Fr.   50 125    ? ? ?Interval imaging/drain manipulation:  ?3/26:  RLQ and L TG drain placement ?4/3: CT AP with  ?4/4: RUQ drain placement on 12/08/21  ?4/11: CT AP with  ?1. Redemonstrated circumferential wall thickening of the

## 2021-12-16 NOTE — Progress Notes (Signed)
[facility] ?Progress Note ? ?1 Day Post-Op  ?Subjective: ?No acute issues overnight. Reports difficulty with pain control today.  ? ?Objective: ?Vital signs in last 24 hours: ?Temp:  [97.8 ?F (36.6 ?C)-99.2 ?F (37.3 ?C)] 97.8 ?F (36.6 ?C) (04/13 0756) ?Pulse Rate:  [78-91] 78 (04/13 0756) ?Resp:  [17-29] 18 (04/13 0756) ?BP: (140-163)/(55-74) 145/64 (04/13 0756) ?SpO2:  [90 %-95 %] 93 % (04/13 0756) ?Last BM Date : 12/14/21 ? ?Intake/Output from previous day: ?04/12 0701 - 04/13 0700 ?In: 2052.3 [I.V.:1803.7; IV Piggyback:248.6] ?Out: 5035 [WSFKC:1275; Drains:175; Blood:30] ?Intake/Output this shift: ?No intake/output data recorded. ? ?PE: ?Gen: Alert, NAD ?GI: distended but soft, midline incision open at the skin, fascia in tact, no surrounding erythema or induration. LUQ colostomy is pink with red rubber catheter in place, bowel sweat in bag. RLQ drain is serosanguinous, transgluteal drain with scant seropurulent fluid. ? ?Lab Results:  ?Recent Labs  ?  12/15/21 ?0209 12/15/21 ?1102 12/16/21 ?0540  ?WBC 12.9*  --  20.2*  ?HGB 8.1* 7.8* 9.1*  ?HCT 25.1* 23.0* 27.4*  ?PLT 590*  --  597*  ? ?BMET ?Recent Labs  ?  12/15/21 ?0209 12/15/21 ?1102 12/16/21 ?0540  ?NA 137 136 137  ?K 3.8 3.4* 3.7  ?CL 99 97* 103  ?CO2 31  --  30  ?GLUCOSE 91 97 122*  ?BUN 5* 4* 7  ?CREATININE 0.44 0.20* <0.30*  ?CALCIUM 8.5*  --  8.2*  ? ?PT/INR ?No results for input(s): LABPROT, INR in the last 72 hours. ?CMP  ?   ?Component Value Date/Time  ? NA 137 12/16/2021 0540  ? K 3.7 12/16/2021 0540  ? CL 103 12/16/2021 0540  ? CO2 30 12/16/2021 0540  ? GLUCOSE 122 (H) 12/16/2021 0540  ? BUN 7 12/16/2021 0540  ? CREATININE <0.30 (L) 12/16/2021 0540  ? CALCIUM 8.2 (L) 12/16/2021 0540  ? PROT 5.5 (L) 12/16/2021 0540  ? ALBUMIN <1.5 (L) 12/16/2021 0540  ? AST 15 12/16/2021 0540  ? ALT 16 12/16/2021 0540  ? ALKPHOS 52 12/16/2021 0540  ? BILITOT 0.3 12/16/2021 0540  ? GFRNONAA NOT CALCULATED 12/16/2021 0540  ? GFRAA  02/27/2010 2026  ?   >60        ?The eGFR has been calculated ?using the MDRD equation. ?This calculation has not been ?validated in all clinical ?situations. ?eGFR's persistently ?<60 mL/min signify ?possible Chronic Kidney Disease.  ? ?Lipase  ?No results found for: LIPASE ? ? ? ? ?Studies/Results: ?CT ABDOMEN PELVIS W CONTRAST ? ?Result Date: 12/14/2021 ?CLINICAL DATA:  Diverticulitis complicated by perforation and abscesses EXAM: CT ABDOMEN AND PELVIS WITH CONTRAST TECHNIQUE: Multidetector CT imaging of the abdomen and pelvis was performed using the standard protocol following bolus administration of intravenous contrast. RADIATION DOSE REDUCTION: This exam was performed according to the departmental dose-optimization program which includes automated exposure control, adjustment of the mA and/or kV according to patient size and/or use of iterative reconstruction technique. CONTRAST:  142mL OMNIPAQUE IOHEXOL 300 MG/ML SOLN, additional oral enteric contrast COMPARISON:  12/05/2021 FINDINGS: Lower chest: Scarring or atelectasis of the bilateral lung bases, right greater than left. Trace right pleural effusion, similar to prior. Hepatobiliary: No solid liver abnormality is seen. No gallstones, gallbladder wall thickening, or biliary dilatation. Unchanged fluid collection in the hepatorenal fossa measuring 4.2 x 2.7 cm (series 3, image 27). Interval placement of a pigtail drainage catheter within a previously seen collection at the tip of the right lobe of the liver, which is now completely  resolved (series 3, image 52). Pancreas: Unremarkable. No pancreatic ductal dilatation or surrounding inflammatory changes. Spleen: Normal in size without significant abnormality. Adrenals/Urinary Tract: Adrenal glands are unremarkable. Mild bilateral hydronephrosis and hydroureter, similar to prior examination, presumably secondary to obstruction of the distal ureters by fluid collections in the low pelvis. Bladder is unremarkable. Stomach/Bowel:  Stomach is within normal limits. Descending and sigmoid diverticulosis. Redemonstrated circumferential wall thickening of the mid to distal sigmoid colon (series 3, image 69). There are are multiple, probably contiguous air and fluid collections in the low pelvis, which are increased in volume compared to prior examination. In particular, a low left pelvic air and fluid collection measures 8.2 x 5.3 cm and contains a pigtail drainage catheter, previously more decompressed, measuring approximately 5.6 x 3.2 cm (series 3, image 72). Low right hemipelvic air and fluid collection measures 6.0 x 3.1 cm, previously 3.8 x 2.0 cm (series 3, image 71). An additional anterior approach pigtail drainage catheter is positioned anterior to this collection (series 3, image 73). Unchanged large interloop fluid collection in the central small bowel mesentery measuring 12.6 x 11.0 cm (series 3, image 54). Vascular/Lymphatic: Aortic atherosclerosis. No enlarged abdominal or pelvic lymph nodes. Reproductive: No mass or other significant abnormality. Other: No abdominal wall hernia or abnormality.  Anasarca. Musculoskeletal: No acute or significant osseous findings. IMPRESSION: 1. Redemonstrated circumferential wall thickening of the mid to distal sigmoid colon, in keeping with diverticulitis. 2. There are multiple, probably contiguous air and fluid collections in the low pelvis, which are increased in volume compared to prior examination. Two pigtail drainage catheters are positioned in the pelvis. 3. Unchanged large interloop fluid collection in the central small bowel mesentery. 4. Interval placement of a pigtail drainage catheter within a previously seen collection at the tip of the right lobe of the liver, which is now completely resolved. 5. Additional smaller fluid collections are unchanged. 6. Mild bilateral hydronephrosis and hydroureter, similar to prior examination, presumably secondary to obstruction of the distal ureters by  fluid collections in the low pelvis. 7. Anasarca. Aortic Atherosclerosis (ICD10-I70.0). Electronically Signed   By: Delanna Ahmadi M.D.   On: 12/14/2021 13:10  ? ?[place] EKG SITE RITE ? ?Result Date: 12/15/2021 ?If [employer] not attached, placement could not be confirmed due to current cardiac rhythm.  ? ?Anti-infectives: ?Anti-infectives (From admission, onward)  ? ? Start     Dose/Rate Route Frequency Ordered Stop  ? 12/15/21 2200  piperacillin-tazobactam (ZOSYN) IVPB 3.375 g       ? 3.375 g ?12.5 mL/hr over 240 Minutes Intravenous Every 8 hours 12/15/21 1457 12/20/21 2159  ? 12/08/21 1500  piperacillin-tazobactam (ZOSYN) IVPB 3.375 g  Status:  Discontinued       ? 3.375 g ?12.5 mL/hr over 240 Minutes Intravenous Every 8 hours 12/08/21 1414 12/15/21 1457  ? 11/27/21 2300  piperacillin-tazobactam (ZOSYN) IVPB 3.375 g       ? 3.375 g ?12.5 mL/hr over 240 Minutes Intravenous Every 8 hours 11/27/21 2202 12/04/21 2025  ? ?  ? ? ? ?Assessment/Plan ?Sigmoid diverticulitis with pelvic abscesses, multiple small bowel interloop abscess, failed nonoperative management with percutaneous drains. 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 ?- Continue Zosyn ?- NPO, NG to low intermittent suction. ?- Awaiting return of bowel function, expect patient will have a prolonged ileus given the extent of inflammatory adhesions. ?- WOC following for ostomy teaching and care ?- Wound care: Saline WTD dressings to  midline incision, plan to vac in next few days. ?- Remove foley catheter ?- Place PICC today and begin TPN. ?- Multimodal pain control: prn dilaudid; scheduled tylenol, toradol and robaxin. ?  ?FEN: IVF$RemoveB'@75cc'aZDjofwO$ /hr, NPO ?ID: Zosyn 3/25>>  ?VTE: SCD's, lovenox ?Foley: none ?  ?Small bilateral pleural effusions with atelectasis ?Bipolar disorder, home meds.  ?Tobacco abuse ?  ?I reviewed last 24 h vitals and pain scores, last 48 h intake and output,  last 24 h labs and trends (including CBC and BMP).  ? ? ? LOS: 19 days  ? ? ?Dwan Bolt, MD ?[facility] ?12/16/2021, 8:55 AM ?Please see Amion for pager number during day hours 7:00am-4:30

## 2021-12-16 NOTE — Progress Notes (Signed)
OT Cancellation Note ? ?Patient Details ?Name: Judith Miller ?MRN: [id number] ?DOB: Nov 08, 1960 ? ? ?Cancelled Treatment:    Reason Eval/Treat Not Completed: Fatigue/lethargy limiting ability to participate;Patient at procedure or test/ unavailable. Attempted x 2. Will continue to follow.  ? ?Evern Bio ?12/16/2021, 2:25 PM ?Martie Round, OTR/L ?Acute Rehabilitation Services ?Pager: [phone] ?Office: [phone]  ?

## 2021-12-16 NOTE — Consult Note (Signed)
[place] Nurse ostomy follow up ?Pt had colostomy surgery performed yesterday.  She has an NG in place and is in pain and feeling poorly.  She requests initial pouch change and teaching session be performed tomorrow, and she will contact her son or daughter to determine if they can be present.  ?Stoma is red and viable, slightly above skin level, red rubber rod in place.  There is no stool or flatus and small amt pink drainage in the pouch. ?Supplies ordered to the bedside: Use barrier rings, Kellie Simmering # [id number], and convex pouches, Kellie Simmering # [phone]  ?Enrolled patient in [facility] Discharge program: Not yet. ?Julien Girt MSN, RN, [place], [place], CNS ?[phone]  ?

## 2021-12-16 NOTE — Progress Notes (Signed)
Peripherally Inserted Central Catheter Placement ? ?The IV Nurse has discussed with the patient and/or persons authorized to consent for the patient, the purpose of this procedure and the potential benefits and risks involved with this procedure.  The benefits include less needle sticks, lab draws from the catheter, and the patient may be discharged home with the catheter. Risks include, but not limited to, infection, bleeding, blood clot (thrombus formation), and puncture of an artery; nerve damage and irregular heartbeat and possibility to perform a PICC exchange if needed/ordered by physician.  Alternatives to this procedure were also discussed.  Bard Power PICC patient education guide, fact sheet on infection prevention and patient information card has been provided to patient /or left at bedside.   ? ?PICC Placement Documentation  ?PICC Double Lumen 12/16/21 Right Brachial 40 cm 0 cm (Active)  ?Indication for Insertion or Continuance of Line Administration of hyperosmolar/irritating solutions (i.e. TPN, Vancomycin, etc.) 12/16/21 1442  ?Exposed Catheter (cm) 0 cm 12/16/21 1442  ?Site Assessment Clean, Dry, Intact 12/16/21 1442  ?Lumen #1 Status Flushed;Blood return noted;Saline locked 12/16/21 1442  ?Lumen #2 Status Flushed;Blood return noted;Saline locked 12/16/21 1442  ?Dressing Type Transparent 12/16/21 1442  ?Dressing Status Antimicrobial disc in place 12/16/21 1442  ?Dressing Change Due 12/23/21 12/16/21 1442  ? ? ? ? ? ?Judith Miller ?12/16/2021, 2:45 PM ? ?

## 2021-12-16 NOTE — Plan of Care (Signed)
  Problem: Pain Managment: Goal: General experience of comfort will improve Outcome: Progressing   Problem: Safety: Goal: Ability to remain free from injury will improve Outcome: Progressing   

## 2021-12-16 NOTE — Progress Notes (Signed)
PHARMACY - TOTAL PARENTERAL NUTRITION CONSULT NOTE ? ?Indication: Prolonged ileus ? ?Patient Measurements: ?Height: 5\' 4"  (162.6 cm) ?Weight: 82.6 kg (182 lb) ?IBW/kg (Calculated) : 54.7 ?TPN AdjBW (KG): 61.7 ?Body mass index is 31.24 kg/m?. ? ?Assessment:  ?25 YOF presented on 3/25 with pelvic abscess 2/2 sigmoid colon perforation, intra-abdominal fluid collections and ileus.  IR placed pelvic drains x2 on 3/26.  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  No clinical improvement and repeat CT on 4/11 shows large abscess and persistent pelvic fluid collection.  Underwent ex-lap with SBR and colostomy on 4/12.  Pharmacy consulted to manage TPN. ? ?Patient was NPO on admit, then advanced to CLD on 3/29, FLD on 3/31 and soft diet on 4/3 through 4/12.  Per RD documentation, intake was 50-80% of patient's needs. ? ?Glucose / Insulin: no hx DM - glucose < 180 on AM labs ?Electrolytes: K 3.7 post 2 runs (goal >/= 4), Mag 1.8 (goal >/= 2), others WNL ?Renal: SCr < 1, BUN WNL ?Hepatic: LFTs / tbili / TG WNL, albumin < 1.5 ?Intake / Output; MIVF: UOP 0.8 ml/kg/hr, drain 6/12, NS 75 ml/hr ?GI Imaging: none since TPN start ?GI Surgeries / Procedures: none since TPN start ? ?Central access: PICC placement pending ?TPN start date: 12/16/21 ? ?Nutritional Goals:  RD Estimated Needs ?Total Energy Estimated Needs: 1800-2000 ?Total Protein Estimated Needs: 90-100 ?Total Fluid Estimated Needs: >/= 2 L ? ?Current Nutrition:  ?NPO ? ?Plan:  ?Initiate TPN at 50 ml/hr at 1800 (goal rate 75 ml/hr) to provide 64g AA, 174g CHO and 37g ILE for a total of 1218 kCal, meeting ~65% of needs ?Electrolytes in TPN: Na 77mEq/L, K 62mEq/L, Ca 47mEq/L, Mag 71mEq/L, Phos 23mmol/L, Cl:Ac 2:1 ?Daily multivitamin and trace elements in TPN ?Start sensitive SSI Q6H ?KCL x 4 per MD ?Mag sulfate 2gm IV x 1 ?Reduce NS to 20 ml/hr at 1800 ?F/U AM labs ? ?Zosyn EID 3.375gm IV Q8H for IAI/pelvic abscess - f/u LOT ? ?Judith Miller  D. 12m, PharmD, BCPS, BCCCP ?12/16/2021, 9:05 AM ? ?

## 2021-12-16 NOTE — TOC Progression Note (Addendum)
Transition of Care (TOC) - Progression Note  ? ? ?Patient Details  ?Name: Judith Miller ?MRN: [id number] ?Date of Birth: [birth year] ? ?Transition of Care (TOC) CM/SW Contact  ?Lockie Pares, RN ?Phone Number: ?12/16/2021, 10:10 AM ? ?Clinical Narrative:    ? ?Hospital day 18 Patient continues with 3 drains, increase in abscess on CT 4/11 4/12 underwent exploratory Lap with colostomy. She has started on TPN. .She is followed by [facility] clinic. PICC will be placed for TPN.   ?Patient uninsured, PT recommended Home Health.  Has submitted for Medicaid. Will need education regarding colostomy to be done by wound care and floor nurses.  ?Will need charity HH closer to [place]. Has son and daughter to assist with care at home, request the wound care RN educate tomorrow so both can be present.  ?TOC will continue to follow for needs, recommendations, and transitions. ?1430 Jeri Modena from [place] aware of possible TPN at home. Checked with FC to see progress of Medicaid ? ?Expected Discharge Plan: Home w Home Health Services ?Barriers to Discharge: Continued Medical Work up ? ?Expected Discharge Plan and Services ?Expected Discharge Plan: Home w Home Health Services ?  ?Discharge Planning Services: CM Consult ?Post Acute Care Choice: Home Health ?Living arrangements for the past 2 months: Single Family Home ?                ?DME Arranged: Walker rolling ?DME Agency: [employer] ?  ?  ?  ?HH Arranged: PT ?HH Agency: [facility] ?Date HH Agency Contacted: 12/02/21 ?Time HH Agency Contacted: 1310 ?Representative spoke with at [facility] Agency: Amy ? ? ?Social Determinants of Health (SDOH) Interventions ?  ? ?Readmission Risk Interventions ?   ? View : No data to display.  ?  ?  ?  ? ? ?

## 2021-12-16 NOTE — Anesthesia Postprocedure Evaluation (Signed)
Anesthesia Post Note ? ?Patient: Judith Miller ? ?Procedure(s) Performed: EXPLORATORY LAPAROTOMY (Abdomen) ?SMALL BOWEL RESECTION (Abdomen) ?TRANSVERSE LOOP COLOSTOMY (Abdomen) ? ?  ? ?Patient location during evaluation: PACU ?Anesthesia Type: General ?Level of consciousness: awake and alert ?Pain management: pain level controlled ?Vital Signs Assessment: post-procedure vital signs reviewed and stable ?Respiratory status: spontaneous breathing, nonlabored ventilation and respiratory function stable ?Cardiovascular status: blood pressure returned to baseline and stable ?Postop Assessment: no apparent nausea or vomiting ?Anesthetic complications: no ? ? ?No notable events documented. ? ?Last Vitals:  ?Vitals:  ? 12/15/21 2117 12/16/21 0438  ?BP: 140/64 (!) 143/65  ?Pulse: 79 78  ?Resp: 18 18  ?Temp: 36.8 ?C 36.8 ?C  ?SpO2: 93% 91%  ?  ?Last Pain:  ?Vitals:  ? 12/16/21 0438  ?TempSrc: Oral  ?PainSc:   ? ? ?  ?  ?  ?  ?  ?  ? ?Lowella Curb ? ? ? ? ?

## 2021-12-17 LAB — GLUCOSE, CAPILLARY
Glucose-Capillary: 114 mg/dL — ABNORMAL HIGH (ref 70–99)
Glucose-Capillary: 122 mg/dL — ABNORMAL HIGH (ref 70–99)
Glucose-Capillary: 124 mg/dL — ABNORMAL HIGH (ref 70–99)
Glucose-Capillary: 141 mg/dL — ABNORMAL HIGH (ref 70–99)

## 2021-12-17 LAB — CBC
HCT: 23.8 % — ABNORMAL LOW (ref 36.0–46.0)
Hemoglobin: 7.5 g/dL — ABNORMAL LOW (ref 12.0–15.0)
MCH: 27.6 pg (ref 26.0–34.0)
MCHC: 31.5 g/dL (ref 30.0–36.0)
MCV: 87.5 fL (ref 80.0–100.0)
Platelets: 608 K/uL — ABNORMAL HIGH (ref 150–400)
RBC: 2.72 MIL/uL — ABNORMAL LOW (ref 3.87–5.11)
RDW: 15.5 % (ref 11.5–15.5)
WBC: 14.5 K/uL — ABNORMAL HIGH (ref 4.0–10.5)
nRBC: 0 % (ref 0.0–0.2)

## 2021-12-17 LAB — BASIC METABOLIC PANEL
Anion gap: 5 (ref 5–15)
BUN: 11 mg/dL (ref 6–20)
CO2: 31 mmol/L (ref 22–32)
Calcium: 8.1 mg/dL — ABNORMAL LOW (ref 8.9–10.3)
Chloride: 97 mmol/L — ABNORMAL LOW (ref 98–111)
Creatinine, Ser: 0.33 mg/dL — ABNORMAL LOW (ref 0.44–1.00)
GFR, Estimated: >60 mL/min (ref >60)
Glucose, Bld: 116 mg/dL — ABNORMAL HIGH (ref 70–99)
Potassium: 3.9 mmol/L (ref 3.5–5.1)
Sodium: 133 mmol/L — ABNORMAL LOW (ref 135–145)

## 2021-12-17 LAB — MAGNESIUM: Magnesium: 2 mg/dL (ref 1.7–2.4)

## 2021-12-17 LAB — PHOSPHORUS: Phosphorus: 2.1 mg/dL — ABNORMAL LOW (ref 2.5–4.6)

## 2021-12-17 LAB — SURGICAL PATHOLOGY

## 2021-12-17 MED ORDER — PROCHLORPERAZINE EDISYLATE 10 MG/2ML IJ SOLN: 10.0000 mg | Freq: Four times a day (QID) | INTRAMUSCULAR | Status: DC | PRN

## 2021-12-17 MED ORDER — MAGNESIUM SULFATE IN D5W 1-5 GM/100ML-% IV SOLN
1.0000 g | Freq: Once | INTRAVENOUS | Status: AC
  Administered 2021-12-17: 1 g via INTRAVENOUS
  Filled 2021-12-17: qty 100

## 2021-12-17 MED ORDER — TRAVASOL 10 % IV SOLN
INTRAVENOUS | Status: AC
  Filled 2021-12-17: qty 954

## 2021-12-17 MED ORDER — KETOROLAC TROMETHAMINE 15 MG/ML IJ SOLN
15.0000 mg | Freq: Three times a day (TID) | INTRAMUSCULAR | Status: AC
  Administered 2021-12-17 (×3): 15 mg via INTRAVENOUS
  Filled 2021-12-17 (×4): qty 1

## 2021-12-17 MED ORDER — SODIUM PHOSPHATES 45 MMOLE/15ML IV SOLN
15.0000 mmol | Freq: Once | INTRAVENOUS | Status: AC
  Administered 2021-12-17: 15 mmol via INTRAVENOUS
  Filled 2021-12-17: qty 5

## 2021-12-17 MED ORDER — ACETAMINOPHEN 10 MG/ML IV SOLN
1000.0000 mg | Freq: Four times a day (QID) | INTRAVENOUS | Status: AC
  Administered 2021-12-17 – 2021-12-18 (×4): 1000 mg via INTRAVENOUS
  Filled 2021-12-17 (×4): qty 100

## 2021-12-17 MED ORDER — POTASSIUM CHLORIDE 10 MEQ/50ML IV SOLN
10.0000 meq | INTRAVENOUS | Status: AC
  Administered 2021-12-17 (×2): 10 meq via INTRAVENOUS
  Filled 2021-12-17 (×2): qty 50

## 2021-12-17 NOTE — Progress Notes (Addendum)
[facility] ?Progress Note ? ?2 Days Post-Op  ?Subjective: ?CC-  ?Complaining of heart burn this morning. Also has a lot of abdominal pain. Did not get out of bed yesterday and refused to work with therapies.  ? ?Objective: ?Vital signs in last 24 hours: ?Temp:  [97.3 ?F (36.3 ?C)-98 ?F (36.7 ?C)] 98 ?F (36.7 ?C) (04/14 1287) ?Pulse Rate:  [70-83] 74 (04/14 0758) ?Resp:  [16-18] 16 (04/14 0758) ?BP: (125-149)/(62-68) 149/62 (04/14 0758) ?SpO2:  [96 %-97 %] 97 % (04/14 0758) ?Last BM Date : 12/14/21 ? ?Intake/Output from previous day: ?04/13 0701 - 04/14 0700 ?In: 1772.8 [I.V.:819.8; IV Piggyback:948] ?Out: 8676 [Urine:1075; Emesis/NG output:275; Drains:100] ?Intake/Output this shift: ?No intake/output data recorded. ? ?PE: ?Gen: Alert, NAD ?GI: distended but soft, open midline incision beefy red with no cellulitis or purulent drainage. LUQ colostomy is pink with red rubber catheter in place, some flatus and trace liquid brown stool in pouch. RLQ drain is serosanguinous, transgluteal drain with scant seropurulent fluid. ? ?Lab Results:  ?Recent Labs  ?  12/15/21 ?0209 12/15/21 ?1102 12/16/21 ?0540  ?WBC 12.9*  --  20.2*  ?HGB 8.1* 7.8* 9.1*  ?HCT 25.1* 23.0* 27.4*  ?PLT 590*  --  597*  ? ?BMET ?Recent Labs  ?  12/16/21 ?7209 12/17/21 ?0409  ?NA 137 133*  ?K 3.7 3.9  ?CL 103 97*  ?CO2 30 31  ?GLUCOSE 122* 116*  ?BUN 7 11  ?CREATININE <0.30* 0.33*  ?CALCIUM 8.2* 8.1*  ? ?PT/INR ?No results for input(s): LABPROT, INR in the last 72 hours. ?CMP  ?   ?Component Value Date/Time  ? NA 133 (L) 12/17/2021 0409  ? K 3.9 12/17/2021 0409  ? CL 97 (L) 12/17/2021 0409  ? CO2 31 12/17/2021 0409  ? GLUCOSE 116 (H) 12/17/2021 0409  ? BUN 11 12/17/2021 0409  ? CREATININE 0.33 (L) 12/17/2021 0409  ? CALCIUM 8.1 (L) 12/17/2021 0409  ? PROT 5.5 (L) 12/16/2021 0540  ? ALBUMIN <1.5 (L) 12/16/2021 0540  ? AST 15 12/16/2021 0540  ? ALT 16 12/16/2021 0540  ? ALKPHOS 52 12/16/2021 0540  ? BILITOT 0.3 12/16/2021 0540  ? GFRNONAA  >60 12/17/2021 0409  ? GFRAA  02/27/2010 2026  ?  >60        ?The eGFR has been calculated ?using the MDRD equation. ?This calculation has not been ?validated in all clinical ?situations. ?eGFR's persistently ?<60 mL/min signify ?possible Chronic Kidney Disease.  ? ?Lipase  ?No results found for: LIPASE ? ? ? ? ?Studies/Results: ?[place] EKG SITE RITE ? ?Result Date: 12/15/2021 ?If [employer] not attached, placement could not be confirmed due to current cardiac rhythm.  ? ?Anti-infectives: ?Anti-infectives (From admission, onward)  ? ? Start     Dose/Rate Route Frequency Ordered Stop  ? 12/15/21 2200  piperacillin-tazobactam (ZOSYN) IVPB 3.375 g       ? 3.375 g ?12.5 mL/hr over 240 Minutes Intravenous Every 8 hours 12/15/21 1457 12/20/21 2159  ? 12/08/21 1500  piperacillin-tazobactam (ZOSYN) IVPB 3.375 g  Status:  Discontinued       ? 3.375 g ?12.5 mL/hr over 240 Minutes Intravenous Every 8 hours 12/08/21 1414 12/15/21 1457  ? 11/27/21 2300  piperacillin-tazobactam (ZOSYN) IVPB 3.375 g       ? 3.375 g ?12.5 mL/hr over 240 Minutes Intravenous Every 8 hours 11/27/21 2202 12/04/21 2025  ? ?  ? ? ? ?Assessment/Plan ?-Sigmoid diverticulitis with pelvic abscesses, multiple small bowel interloop abscess, failed nonoperative management with  percutaneous drains.  ?-POD#2 S/p exploratory laparotomy, small bowel resection, drainage of intraabdominal abscesses, and diverting loop colostomy on 12/15/21. ?- Continue Zosyn at least 5 days postop ?- continue NPO/NGT to LIWS and await return in bowel function. expect patient will have a prolonged ileus given the extent of inflammatory adhesions  ?- continue TPN until reliably tolerating a diet ?- continue surgical JP drain and IR transgluteal drain ?- encouraged mobilization, PT/OT ?- will order wound vac for midline incision today ?- WOC following for ostomy teaching and care. Plan to remove red rubber catheter POD#7 (12/22/21) ?- Multimodal pain control: prn dilaudid; continue  scheduled IV tylenol, toradol and robaxin. ?  ?FEN: IVF$RemoveB'@20cc'befjKnAl$ /hr, NPO/NGT to LIWS, TPN ?ID: Zosyn 3/25>>  ?VTE: SCD's, lovenox ?Foley: out 4/13 and voiding ?  ?ABL anemia - Hgb 7.5, no tachycardia or hypotension. S/p 2 units PRBCs intraop. Repeat CBC in AM ?Small bilateral pleural effusions with atelectasis ?Bipolar disorder, home meds.  ?Tobacco abuse ? ? LOS: 20 days  ? ? ?Wellington Hampshire, PA-C ?[facility] ?12/17/2021, 8:23 AM ?Please see Amion for pager number during day hours 7:00am-4:30pm ? ?

## 2021-12-17 NOTE — Progress Notes (Signed)
Physical Therapy Treatment ?Patient Details ?Name: Judith Miller ?MRN: [id number] ?DOB: 10-26-1960 ?Today's Date: 12/17/2021 ? ? ?History of Present Illness 61 y.o. female presents to [facility] hospital 11/27/2021 as a transfer from [facility] with perforated sigmoid colon. Pt underwent CT guided pelvic drain placement on 3/26. PMH includes bipolar disorder. ? ?  ?PT Comments  ? ? Pt received supine and agreeable to session with focus on initiation of mobility s/p new colostomy. Pt agreeable with encouragement, pt continues to be fearful of mobility increasing her pain and causing harm despite premedication and reassurance. Pt requring mod a for all bed mobility, able to come to sitting EOB with mod a and stand pivot transfer to/from [facility] with min a. Pt guarded throughout all movements, and declining further OOB mobility this session. Pt continues to benefit from skilled PT services to progress toward functional mobility goals.   ?  ?Recommendations for follow up therapy are one component of a multi-disciplinary discharge planning process, led by the attending physician.  Recommendations may be updated based on patient status, additional functional criteria and insurance authorization. ? ?Follow Up Recommendations ? Home health PT ?  ?  ?Assistance Recommended at Discharge Intermittent Supervision/Assistance  ?Patient can return home with the following [name];[name];Assistance with cooking/housework;Assist for transportation;[name] ?  ?Equipment Recommendations ? Rolling walker (2 wheels)  ?  ?Recommendations for Other Services   ? ? ?  ?Precautions / Restrictions Precautions ?Precautions: Fall ?Precaution Comments: JP drain x 3, new colostomy, wound vac ?Restrictions ?Weight Bearing Restrictions: No ?Other Position/Activity Restrictions: skin change on sacral area  ?  ? ?Mobility ? Bed Mobility ?Overal bed  mobility: Needs Assistance ?Bed Mobility: Rolling ?Rolling: Mod assist ?Sidelying to sit: Mod assist ?  ?Sit to supine: Mod assist ?  ?General bed mobility comments: mod assist econdary to increased pain post sx, assist to elevate trunk and bring BLE into bed. ?  ? ?Transfers ?Overall transfer level: Needs assistance ?Equipment used: 1 person hand held assist ?Transfers: Sit to/from Stand, Bed to chair/wheelchair/BSC ?Sit to Stand: Min assist ?Stand pivot transfers: Min assist ?  ?  ?  ?  ?General transfer comment: min a to power up and steady once standing. ?  ? ?Ambulation/Gait ?  ?  ?  ?  ?  ?  ?  ?General Gait Details: declined secondary to pain ? ? ?Stairs ?  ?  ?  ?  ?  ? ? ?Wheelchair Mobility ?  ? ?Modified Rankin (Stroke Patients Only) ?  ? ? ?  ?Balance Overall balance assessment: Needs assistance ?Sitting-balance support: No upper extremity supported, Feet supported ?Sitting balance-Leahy Scale: Good ?  ?  ?Standing balance support: Single extremity supported, No upper extremity supported ?Standing balance-Leahy Scale: Poor ?Standing balance comment: minG-minA ?  ?  ?  ?  ?  ?  ?  ?  ?  ?  ?  ?  ? ?  ?Cognition Arousal/Alertness: Awake/alert ?Behavior During Therapy: [facility] for tasks assessed/performed ?Overall Cognitive Status: Within Functional Limits for tasks assessed ?  ?  ?  ?  ?  ?  ?  ?  ?  ?  ?  ?  ?  ?  ?  ?  ?  ?  ?  ? ?  ?Exercises   ? ?  ?General Comments   ?  ?  ? ?Pertinent Vitals/Pain Pain Assessment ?Pain Assessment: Faces ?Faces Pain  Scale: Hurts whole lot ?Pain Location: abdomen, buttocks ?Pain Descriptors / Indicators: Guarding, Grimacing ?Pain Intervention(s): Limited activity within patient's tolerance, Monitored during session, Premedicated before session, Repositioned  ? ? ?Home Living   ?  ?  ?  ?  ?  ?  ?  ?  ?  ?   ?  ?Prior Function    ?  ?  ?   ? ?PT Goals (current goals can now be found in the care plan section) Acute Rehab PT Goals ?Patient Stated Goal: go back to work ?PT Goal  Formulation: With patient ?Time For Goal Achievement: 12/15/21 ? ?  ?Frequency ? ? ? Min 3X/week ? ? ? ?  ?PT Plan Current plan remains appropriate  ? ? ?Co-evaluation   ?  ?  ?  ?  ? ?  ?AM-PAC PT "6 Clicks" Mobility   ?Outcome Measure ? Help needed turning from your back to your side while in a flat bed without using bedrails?: A Lot ?Help needed moving from lying on your back to sitting on the side of a flat bed without using bedrails?: A Lot ?Help needed moving to and from a bed to a chair (including a wheelchair)?: A Lot ?Help needed standing up from a chair using your arms (e.g., wheelchair or bedside chair)?: A Lot ?Help needed to walk in hospital room?: A Lot ?Help needed climbing 3-5 steps with a railing? : A Lot ?6 Click Score: 12 ? ?  ?End of Session   ?Activity Tolerance: Patient limited by pain ?Patient left: in bed;with call bell/phone within reach;with nursing/sitter in room ?Nurse Communication: Mobility status ?PT Visit Diagnosis: Other abnormalities of gait and mobility (R26.89);Muscle weakness (generalized) (M62.81);Pain ?Pain - Right/Left:  (abdomen) ?Pain - part of body:  (abdomen) ?  ? ? ?Time: 5643-3295 ?PT Time Calculation (min) (ACUTE ONLY): 20 min ? ?Charges:  $Therapeutic Activity: 8-22 mins          ?          ?Lenora Boys. PTA ?Acute Rehabilitation Services ?Office: [phone] ? ? ? ?Marlana Salvage Shamaya Kauer ?12/17/2021, 10:05 AM ? ?

## 2021-12-17 NOTE — Evaluation (Signed)
Occupational Therapy Evaluation ?Patient Details ?Name: Judith DullKaren F Miller ?MRN: [id number] ?DOB: 04/01/1961 ?Today's Date: 12/17/2021 ? ? ?History of Present Illness 61 y.o. female presents to [facility] hospital 11/27/2021 as a transfer from [facility] with perforated sigmoid colon. Pt underwent CT guided pelvic drain placement on 3/26. 12/15/21 underwent ex lap with SBRand colostomy with placement of wound vac due to large abscess and persistent pelvic fluid collection.  PMH includes bipolar disorder.  ? ?Clinical Impression ?  ?Pt was independent prior to admission. Presents with significant abdominal pain, generalized weakness and impaired standing balance. She needs verbal cues and moderate assistance for bed mobility, min HHA for transfers and set up to total assist for ADLs. Pt needs to be modified independent to return home as she had minimal family support. Will follow acutely.  ?   ? ?Recommendations for follow up therapy are one component of a multi-disciplinary discharge planning process, led by the attending physician.  Recommendations may be updated based on patient status, additional functional criteria and insurance authorization.  ? ?Follow Up Recommendations ? Home health OT  ?  ?Assistance Recommended at Discharge Frequent or constant Supervision/Assistance  ?Patient can return home with the following [name];Assist for transportation;[name];A lot of help with bathing/dressing/bathroom ? ?  ?Functional Status Assessment ? Patient has had a recent decline in their functional status and demonstrates the ability to make significant improvements in function in a reasonable and predictable amount of time.  ?Equipment Recommendations ? BSC/3in1  ?  ?Recommendations for Other Services   ? ? ?  ?Precautions / Restrictions Precautions ?Precautions: Fall ?Precaution Comments: JP drain x 3, new colostomy, wound vac, abdominal precautions for  comfort ?Restrictions ?Weight Bearing Restrictions: No  ? ?  ? ?Mobility Bed Mobility ?Overal bed mobility: Needs Assistance ?Bed Mobility: Rolling, Sidelying to Sit, Sit to Sidelying ?Rolling: Mod assist ?Sidelying to sit: Mod assist ?  ?  ?Sit to sidelying: Mod assist ?General bed mobility comments: cues for log roll ?  ? ?Transfers ?Overall transfer level: Needs assistance ?Equipment used: 1 person hand held assist ?Transfers: Sit to/from Stand, Bed to chair/wheelchair/BSC ?Sit to Stand: Min assist ?Stand pivot transfers: Min assist ?  ?  ?  ?  ?General transfer comment: assist to rise and steady, pt adamant she will not use RW ?  ? ?  ?Balance Overall balance assessment: Needs assistance ?Sitting-balance support: Single extremity supported, Bilateral upper extremity supported ?Sitting balance-Leahy Scale: Fair ?Sitting balance - Comments: stabilizing with at least one hand on bed due to abdominal pain ?  ?Standing balance support: Single extremity supported, No upper extremity supported ?Standing balance-Leahy Scale: Poor ?Standing balance comment: min assist ?  ?  ?  ?  ?  ?  ?  ?  ?  ?  ?  ?   ? ?ADL either performed or assessed with clinical judgement  ? ?ADL Overall ADL's : Needs assistance/impaired ?Eating/Feeding: NPO ?  ?Grooming: Bed level;Set up ?  ?Upper Body Bathing: Moderate assistance;Sitting ?  ?Lower Body Bathing: Total assistance;Sit to/from stand ?  ?Upper Body Dressing : Moderate assistance;Sitting ?  ?Lower Body Dressing: Total assistance;Sit to/from stand ?  ?Toilet Transfer: Minimal assistance;Stand-pivot;BSC/3in1 ?  ?Toileting- Clothing Manipulation and Hygiene: Minimal assistance;Sit to/from stand ?  ?  ?  ?  ?General ADL Comments: Pt unable to sit without propping on at least one UE to participate in ADLs due to pain.  ? ? ? ?  Vision Patient Visual Report: No change from baseline ?   ?   ?Perception   ?  ?Praxis   ?  ? ?Pertinent Vitals/Pain Pain Assessment ?Pain Assessment: Faces ?Faces  Pain Scale: Hurts whole lot ?Pain Location: abdomen ?Pain Descriptors / Indicators: Guarding, Grimacing ?Pain Intervention(s): Premedicated before session, Monitored during session, Repositioned  ? ? ? ?Hand Dominance Right ?  ?Extremity/Trunk Assessment Upper Extremity Assessment ?Upper Extremity Assessment: Overall WFL for tasks assessed ?  ?  ?  ?Cervical / Trunk Assessment ?Cervical / Trunk Assessment: Other exceptions ?  ?Communication Communication ?Communication: No difficulties ?  ?Cognition Arousal/Alertness: Awake/alert ?Behavior During Therapy: Anxious ?Overall Cognitive Status: Within Functional Limits for tasks assessed ?  ?  ?  ?  ?  ?  ?  ?  ?  ?  ?  ?  ?  ?  ?  ?  ?  ?  ?  ?General Comments    ? ?  ?Exercises   ?  ?Shoulder Instructions    ? ? ?Home Living Family/patient expects to be discharged to:: Private residence ?Living Arrangements: Alone ?Available Help at Discharge: Family;Available PRN/intermittently ?Type of Home: House ?Home Access: Stairs to enter ?Entrance Stairs-Number of Steps: 2 ?Entrance Stairs-Rails: None ?Home Layout: One level ?  ?  ?Bathroom Shower/Tub: Tub/shower unit ?  ?Bathroom Toilet: Standard ?  ?  ?Home Equipment: None ?  ?  ?  ? ?  ?Prior Functioning/Environment Prior Level of Function : Independent/Modified Independent;Working/employed;Driving ?  ?  ?  ?  ?  ?  ?  ?  ?  ? ?  ?  ?OT Problem List: Impaired balance (sitting and/or standing);Decreased knowledge of use of DME or AE;Pain ?  ?   ?OT Treatment/Interventions: Self-care/ADL training;DME and/or AE instruction;Therapeutic activities;Patient/family education;Balance training  ?  ?OT Goals(Current goals can be found in the care plan section) Acute Rehab OT Goals ?OT Goal Formulation: With patient ?Time For Goal Achievement: 12/31/21 ?Potential to Achieve Goals: Good ?ADL Goals ?Pt Will Perform Grooming: with modified independence;standing ?Pt Will Perform Lower Body Bathing: with modified independence;with adaptive  equipment;sit to/from stand ?Pt Will Perform Lower Body Dressing: with modified independence;with adaptive equipment;sit to/from stand ?Pt Will Transfer to Toilet: with modified independence;ambulating;bedside commode ?Pt Will Perform Toileting - Clothing Manipulation and hygiene: with modified independence;sit to/from stand ?Additional ADL Goal #1: Pt will perform bed mobility modified independently in preparation for ADLs.  ?OT Frequency: Min 2X/week ?  ? ?Co-evaluation   ?  ?  ?  ?  ? ?  ?AM-PAC OT "6 Clicks" Daily Activity     ?Outcome Measure Help from another person eating meals?: Total ?Help from another person taking care of personal grooming?: A Little ?Help from another person toileting, which includes using toliet, bedpan, or urinal?: A Lot ?Help from another person bathing (including washing, rinsing, drying)?: A Lot ?Help from another person to put on and taking off regular upper body clothing?: A Little ?Help from another person to put on and taking off regular lower body clothing?: Total ?6 Click Score: 12 ?  ?End of Session   ? ?Activity Tolerance: Patient limited by pain ?Patient left: in bed;with call bell/phone within reach ? ?OT Visit Diagnosis: Unsteadiness on feet (R26.81);Other abnormalities of gait and mobility (R26.89);Pain;Muscle weakness (generalized) (M62.81)  ?              ?Time: 3664-4034 ?OT Time Calculation (min): 24 min ?Charges:  OT General Charges ?$OT Visit: 1 Visit ?OT  Evaluation ?$OT Eval Moderate Complexity: 1 Mod ?OT Treatments ?$Self Care/Home Management : 8-22 mins ? ?Martie Round, OTR/L ?Acute Rehabilitation Services ?Pager: [phone] ?Office: [phone]  ? ?Evern Bio ?12/17/2021, 2:17 PM ?

## 2021-12-17 NOTE — Progress Notes (Signed)
PHARMACY - TOTAL PARENTERAL NUTRITION CONSULT NOTE ? ?Indication: Prolonged ileus ? ?Patient Measurements: ?Height: 5\' 4"  (162.6 cm) ?Weight: 82.6 kg (182 lb) ?IBW/kg (Calculated) : 54.7 ?TPN AdjBW (KG): 61.7 ?Body mass index is 31.24 kg/m?. ? ?Assessment:  ?25 YOF presented on 3/25 with pelvic abscess 2/2 sigmoid colon perforation, intra-abdominal fluid collections and ileus.  IR placed pelvic drains x2 on 3/26.  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  No clinical improvement and repeat CT on 4/11 shows large abscess and persistent pelvic fluid collection.  Underwent ex-lap with SBR and colostomy on 4/12.  Pharmacy consulted to manage TPN. ? ?Patient was NPO on admit, then advanced to CLD on 3/29, FLD on 3/31 and soft diet on 4/3 through 4/12.  Per RD documentation, intake was 50-80% of patient's needs. ? ?Glucose / Insulin: no hx DM - glucose < 180  ?2 units insulin used after start of TPN, also received Decadron 10 mg on 4/12 ?Electrolytes: K 3.9 s/p 4 runs (goal >/= 4), Mag 2 (goal >/= 2), Phos 2.1, Na low 133, others WNL ?Renal: SCr < 1, BUN WNL ?Hepatic: LFTs / tbili / TG WNL, albumin < 1.5 ?Intake / Output; MIVF: UOP 0.5 ml/kg/hr, drain 6/12, NGT , NS 20 ml/hr ?GI Imaging: none since TPN start ?GI Surgeries / Procedures: none since TPN start ? ?Central access: double lumen PICC 4/13 ?TPN start date: 12/16/21 ? ?Nutritional Goals:  RD Estimated Needs ?Total Energy Estimated Needs: 1800-2000 ?Total Protein Estimated Needs: 90-100 ?Total Fluid Estimated Needs: >/= 2 L ? ?Current Nutrition:  ?NPO ? ?Plan:  ?Increase TPN to goal rate 75 ml/hr at 1800 to provide 95g AA, 261g CHO and 56g ILE for a total of 1827 kCal, meeting 100% of needs ?Electrolytes in TPN: Na 28mEq/L, K 22mEq/L, decr Ca 2mEq/L, Mag 69mEq/L, Phos 58mmol/L, Cl:Ac 2:1 - lytes increased (except Ca) with increase rate of TPN ?Daily multivitamin and trace elements in TPN ?Continue sensitive SSI  Q6H ?F/U AM labs ? ?NaPhos 15 mmol IV x1 ?KCl 10 meq IV q1h x2 ?Mag 1 g IV x1 ? ?Zosyn EID 3.375gm IV Q8H for IAI/pelvic abscess - f/u LOT ? ?Thank you for involving pharmacy in this patient's care. ? ?01-23-1992, PharmD, BCPS ?Clinical Pharmacist ?Clinical phone for 12/17/2021 until 3p is [phone] ?12/17/2021 7:11 AM ? ?**Pharmacist phone directory can be found on [email or web address] listed under [facility]** ? ?

## 2021-12-17 NOTE — Progress Notes (Signed)
Mobility Specialist Progress Note: ? ? 12/17/21 1421  ?Mobility  ?Activity Ambulated with assistance in room;Ambulated with assistance to bathroom  ?Level of Assistance Minimal assist, patient does 75% or more  ?Assistive Device BSC  ?Distance Ambulated (ft) 20 ft  ?Activity Response Tolerated well  ?$Mobility charge 1 Mobility  ? ?Pt received in bed. With max encouragement agreed to use BSC.  Pt then said she felt okay to try and walk. Ambulated to the couch in room then back to bed. Needed assistance getting legs into bed. Left in bed with call bell in reach and all needs met.  ? ?Judith Miller ?Mobility Specialist ?Primary Phone [phone] ? ?

## 2021-12-17 NOTE — Consult Note (Addendum)
WOC Nurse Wound consult Note: ?Reason for Consult: Surgical team following for assessment and plan of care for abd wound.   ?Requested to apply Vac dressing to midline full thickness post-op wound. Pt was medicated for pain prior to the procedure and tolerated with mod amt discomfort.  ?Measurement: 19X7X4cm ?Applied one piece black foam to cont suction, small amt pink drainage. ?WOC team will plan to change on Mon. ? ?WOC Nurse ostomy follow up ?Pt stated that no family members could come in today for a teaching session.   ?Demonstrated pouch change; Pt will need total assistance with ostomy pouch changes from home health nurses until Vac dressing is discontinued, located in close proximity to the stoma/abd wound. The Vac dressing and the ostomy pouch will need to be changed Q M/W/F. ?Stoma type/location: Stoma is red and viable, slightly above skin level, red rubber rod in place.  ?Stomal assessment/size:  1 1/2 inches ?Peristomal assessment: intact ?Output: Pt has an NG, no stool or flatus in pouch at this time. ?Ostomy pouching: 1pc. ?Education provided:  Demonstrated pouch change to patient using a hand-held mirror.  She watched the process of cutting, applying a barrier ring, and applying pouch.  Use supplies barrier ring [employer] [place] # [id number], convex Gigi Gin # [id number]. ?She was able to open and close velcro to empty. Discussed ordering supplies and pouching routines.  Pt appeared overwhelmed and did not feel she would be able to perform these activities.  Reassured her WOC team will perform another teaching session on Mon when she is feeling better, and she will have continued support from home health nurses related to Vac/ostomy locations. Pt does not have insurance, according to EMR, and indigent information provided. Educational materials left at the bedside, along with 5 sets of barrier rings and convex pouches.  ?WOC team ordered to remove red rubber rod on 4/19.  ?Enrolled patient in [place] Secure  Start Discharge program: Yes ?Cammie Mcgee MSN, RN, CWOCN, [place], CNS ?[phone]  ? ?  ?

## 2021-12-18 LAB — BASIC METABOLIC PANEL
Anion gap: 4 — ABNORMAL LOW (ref 5–15)
BUN: 8 mg/dL (ref 6–20)
CO2: 33 mmol/L — ABNORMAL HIGH (ref 22–32)
Calcium: 8.5 mg/dL — ABNORMAL LOW (ref 8.9–10.3)
Chloride: 103 mmol/L (ref 98–111)
Creatinine, Ser: 0.35 mg/dL — ABNORMAL LOW (ref 0.44–1.00)
GFR, Estimated: >60 mL/min (ref >60)
Glucose, Bld: 114 mg/dL — ABNORMAL HIGH (ref 70–99)
Potassium: 3.9 mmol/L (ref 3.5–5.1)
Sodium: 140 mmol/L (ref 135–145)

## 2021-12-18 LAB — CBC
HCT: 23.9 % — ABNORMAL LOW (ref 36.0–46.0)
Hemoglobin: 7.3 g/dL — ABNORMAL LOW (ref 12.0–15.0)
MCH: 27.1 pg (ref 26.0–34.0)
MCHC: 30.5 g/dL (ref 30.0–36.0)
MCV: 88.8 fL (ref 80.0–100.0)
Platelets: 559 10*3/uL — ABNORMAL HIGH (ref 150–400)
RBC: 2.69 MIL/uL — ABNORMAL LOW (ref 3.87–5.11)
RDW: 15.6 % — ABNORMAL HIGH (ref 11.5–15.5)
WBC: 10.5 10*3/uL (ref 4.0–10.5)
nRBC: 0 % (ref 0.0–0.2)

## 2021-12-18 LAB — GLUCOSE, CAPILLARY
Glucose-Capillary: 104 mg/dL — ABNORMAL HIGH (ref 70–99)
Glucose-Capillary: 114 mg/dL — ABNORMAL HIGH (ref 70–99)
Glucose-Capillary: 121 mg/dL — ABNORMAL HIGH (ref 70–99)
Glucose-Capillary: 130 mg/dL — ABNORMAL HIGH (ref 70–99)

## 2021-12-18 LAB — PHOSPHORUS: Phosphorus: 3 mg/dL (ref 2.5–4.6)

## 2021-12-18 LAB — MAGNESIUM: Magnesium: 1.9 mg/dL (ref 1.7–2.4)

## 2021-12-18 MED ORDER — POTASSIUM CHLORIDE 10 MEQ/50ML IV SOLN
10.0000 meq | INTRAVENOUS | Status: AC
  Administered 2021-12-18: 10 meq via INTRAVENOUS
  Filled 2021-12-18 (×2): qty 50

## 2021-12-18 MED ORDER — MAGNESIUM SULFATE 2 GM/50ML IV SOLN
2.0000 g | Freq: Once | INTRAVENOUS | Status: AC
  Administered 2021-12-18: 2 g via INTRAVENOUS
  Filled 2021-12-18: qty 50

## 2021-12-18 MED ORDER — POTASSIUM CHLORIDE 10 MEQ/50ML IV SOLN
10.0000 meq | Freq: Once | INTRAVENOUS | Status: AC
Start: 2021-12-18 — End: 2021-12-18
  Administered 2021-12-18: 10 meq via INTRAVENOUS
  Filled 2021-12-18: qty 50

## 2021-12-18 MED ORDER — TRAVASOL 10 % IV SOLN
INTRAVENOUS | Status: AC
  Filled 2021-12-18: qty 954

## 2021-12-18 NOTE — Progress Notes (Signed)
PHARMACY - TOTAL PARENTERAL NUTRITION CONSULT NOTE ? ?Indication: Prolonged ileus ? ?Patient Measurements: ?Height: 5\' 4"  (162.6 cm) ?Weight: 82.6 kg (182 lb) ?IBW/kg (Calculated) : 54.7 ?TPN AdjBW (KG): 61.7 ?Body mass index is 31.24 kg/m?. ? ?Assessment:  ?48 YOF presented on 3/25 with pelvic abscess 2/2 sigmoid colon perforation, intra-abdominal fluid collections and ileus.  IR placed pelvic drains x2 on 3/26.  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  No clinical improvement and repeat CT on 4/11 shows large abscess and persistent pelvic fluid collection.  Underwent ex-lap with SBR and colostomy on 4/12.  Pharmacy consulted to manage TPN. ? ?Patient was NPO on admit, then advanced to CLD on 3/29, FLD on 3/31 and soft diet on 4/3 through 4/12.  Per RD documentation, intake was 50-80% of patient's needs. ? ?Glucose / Insulin: no hx DM - glucose < 180  ?2 units insulin used in 24h, received Decadron 10 mg on 4/12 ?Electrolytes: K 3.9 s/p 2 runs (goal >/= 4), Mag 1.9 s/p 1g (goal >/= 2), Phos up 3, CoCa high-nml, others WNL ?Renal: SCr < 1, BUN WNL ?Hepatic: LFTs / tbili / TG WNL, albumin < 1.5 ?Intake / Output; MIVF: UOP 0.5 ml/kg/hr, drain output not charted, NGT 6/12, NS KVO ?GI Imaging: none since TPN start ?GI Surgeries / Procedures: none since TPN start ? ?Central access: double lumen PICC 4/13 ?TPN start date: 12/16/21 ? ?Nutritional Goals:  RD Estimated Needs ?Total Energy Estimated Needs: 1800-2000 ?Total Protein Estimated Needs: 90-100 ?Total Fluid Estimated Needs: >/= 2 L ? ?Current Nutrition:  ?NPO ? ?Plan:  ?Increase TPN to goal rate 75 ml/hr at 1800 to provide 95g AA, 261g CHO and 56g ILE for a total of 1827 kCal, meeting 100% of needs ?Electrolytes in TPN: Na 29mEq/L, incr K 87mEq/L, remove Ca, Mag 66mEq/L, Phos 66mmol/L, Change to Cl:Ac 1:1  ?Daily multivitamin and trace elements in TPN ?Continue sensitive SSI Q6H - consider d/c if continued minimal  use ?F/U AM labs ? ?KCl 10 meq IV q1h x2 ?Mag 2 g IV x1 ? ?Thank you for involving pharmacy in this patient's care. ? ?[id number], PharmD, BCPS ?Clinical Pharmacist ?Clinical phone for 12/18/2021 until 3p is [phone] ?12/18/2021 7:16 AM ? ?**Pharmacist phone directory can be found on [email or web address] listed under [facility]** ? ?

## 2021-12-18 NOTE — Progress Notes (Addendum)
? ? ?  Assessment & Plan: ?-Sigmoid diverticulitis with pelvic abscesses, multiple small bowel interloop abscess, failed nonoperative management with percutaneous drains.  ?-POD#3 - S/p exploratory laparotomy, small bowel resection, drainage of intraabdominal abscesses, and diverting loop colostomy on 12/15/21 - Dr. Freida Busman. ?- Continue Zosyn at least 5 days postop ?- continue NPO/NGT to LIWS and await return in bowel function ?- continue TPN until reliably tolerating a diet ?- continue surgical JP drain and IR transgluteal drain ?- encouraged mobilization, PT/OT ?- wound vac on midline incision working well ?- WOC following - plan to remove red rubber catheter bridge on POD#7 (12/22/21) ?- Multimodal pain control: prn dilaudid; continue scheduled IV tylenol, toradol and robaxin ?- acute blood loss anemia - Hgb stable at 7.3 - continue to monitor ?  ? ?      Darnell Level, MD ?      [facility] ?      Office: [phone] ? ? ?Chief Complaint: ?Perforated diverticulitis with abscess ? ?Subjective: ?Patient up in chair, mild pain.  Wants to eat. ? ?Objective: ?Vital signs in last 24 hours: ?Temp:  [97.7 ?F (36.5 ?C)-98.4 ?F (36.9 ?C)] 98.2 ?F (36.8 ?C) (04/15 0756) ?Pulse Rate:  [67-72] 72 (04/15 0756) ?Resp:  [16-19] 17 (04/15 0756) ?BP: (131-136)/(63-89) 135/63 (04/15 0756) ?SpO2:  [94 %-98 %] 95 % (04/15 0756) ?Last BM Date : 12/14/21 ? ?Intake/Output from previous day: ?04/14 0701 - 04/15 0700 ?In: 2786.4 [I.V.:1796.8; IV Piggyback:989.7] ?Out: 1200 [Urine:900; Emesis/NG output:300] ?Intake/Output this shift: ?No intake/output data recorded. ? ?Physical Exam: ?HEENT - sclerae clear, mucous membranes moist ?Abdomen - soft, VAC intact; ostomy viable with minimal stool, no gas in bag ?Ext - no edema, non-tender ?Neuro - alert & oriented, no focal deficits ? ?Lab Results:  ?Recent Labs  ?  12/17/21 ?0409 12/18/21 ?0405  ?WBC 14.5* 10.5  ?HGB 7.5* 7.3*  ?HCT 23.8* 23.9*  ?PLT 608* 559*  ? ?BMET ?Recent  Labs  ?  12/17/21 ?0409 12/18/21 ?0405  ?NA 133* 140  ?K 3.9 3.9  ?CL 97* 103  ?CO2 31 33*  ?GLUCOSE 116* 114*  ?BUN 11 8  ?CREATININE 0.33* 0.35*  ?CALCIUM 8.1* 8.5*  ? ?PT/INR ?No results for input(s): LABPROT, INR in the last 72 hours. ?Comprehensive Metabolic Panel: ?   ?Component Value Date/Time  ? NA 140 12/18/2021 0405  ? NA 133 (L) 12/17/2021 0409  ? K 3.9 12/18/2021 0405  ? K 3.9 12/17/2021 0409  ? CL 103 12/18/2021 0405  ? CL 97 (L) 12/17/2021 0409  ? CO2 33 (H) 12/18/2021 0405  ? CO2 31 12/17/2021 0409  ? BUN 8 12/18/2021 0405  ? BUN 11 12/17/2021 0409  ? CREATININE 0.35 (L) 12/18/2021 0405  ? CREATININE 0.33 (L) 12/17/2021 0409  ? GLUCOSE 114 (H) 12/18/2021 0405  ? GLUCOSE 116 (H) 12/17/2021 0409  ? CALCIUM 8.5 (L) 12/18/2021 0405  ? CALCIUM 8.1 (L) 12/17/2021 0409  ? AST 15 12/16/2021 0540  ? ALT 16 12/16/2021 0540  ? ALKPHOS 52 12/16/2021 0540  ? BILITOT 0.3 12/16/2021 0540  ? PROT 5.5 (L) 12/16/2021 0540  ? ALBUMIN <1.5 (L) 12/16/2021 0540  ? ? ?Studies/Results: ?No results found. ? ? ? ?Darnell Level ?12/18/2021 ? ? Patient ID: Judith Miller, female   DOB: [birth year], 61 y.o.   MRN: [id number] ? ?

## 2021-12-18 NOTE — Plan of Care (Signed)
°  Problem: Safety: °Goal: Ability to remain free from injury will improve °Outcome: Progressing °  °Problem: Pain Managment: °Goal: General experience of comfort will improve °Outcome: Not Progressing °  °

## 2021-12-19 LAB — TYPE AND SCREEN
ABO/RH(D): A POS
Antibody Screen: NEGATIVE
Unit division: 0
Unit division: 0

## 2021-12-19 LAB — AEROBIC/ANAEROBIC CULTURE W GRAM STAIN (SURGICAL/DEEP WOUND)

## 2021-12-19 LAB — BASIC METABOLIC PANEL
Anion gap: 6 (ref 5–15)
BUN: 8 mg/dL (ref 6–20)
CO2: 30 mmol/L (ref 22–32)
Calcium: 8.1 mg/dL — ABNORMAL LOW (ref 8.9–10.3)
Chloride: 98 mmol/L (ref 98–111)
Creatinine, Ser: 0.35 mg/dL — ABNORMAL LOW (ref 0.44–1.00)
GFR, Estimated: >60 mL/min (ref >60)
Glucose, Bld: 122 mg/dL — ABNORMAL HIGH (ref 70–99)
Potassium: 4 mmol/L (ref 3.5–5.1)
Sodium: 134 mmol/L — ABNORMAL LOW (ref 135–145)

## 2021-12-19 LAB — BPAM RBC
Blood Product Expiration Date: 202305032359
Blood Product Expiration Date: 202305092359
ISSUE DATE / TIME: 202304121032
ISSUE DATE / TIME: 202304121032
Unit Type and Rh: 6200
Unit Type and Rh: 6200

## 2021-12-19 LAB — MAGNESIUM: Magnesium: 2 mg/dL (ref 1.7–2.4)

## 2021-12-19 LAB — GLUCOSE, CAPILLARY
Glucose-Capillary: 115 mg/dL — ABNORMAL HIGH (ref 70–99)
Glucose-Capillary: 135 mg/dL — ABNORMAL HIGH (ref 70–99)
Glucose-Capillary: 159 mg/dL — ABNORMAL HIGH (ref 70–99)

## 2021-12-19 LAB — CBC
HCT: 24.6 % — ABNORMAL LOW (ref 36.0–46.0)
Hemoglobin: 7.8 g/dL — ABNORMAL LOW (ref 12.0–15.0)
MCH: 27.6 pg (ref 26.0–34.0)
MCHC: 31.7 g/dL (ref 30.0–36.0)
MCV: 86.9 fL (ref 80.0–100.0)
Platelets: 601 10*3/uL — ABNORMAL HIGH (ref 150–400)
RBC: 2.83 MIL/uL — ABNORMAL LOW (ref 3.87–5.11)
RDW: 15.5 % (ref 11.5–15.5)
WBC: 15.2 10*3/uL — ABNORMAL HIGH (ref 4.0–10.5)
nRBC: 0 % (ref 0.0–0.2)

## 2021-12-19 LAB — ALBUMIN: Albumin: 1.6 g/dL — ABNORMAL LOW (ref 3.5–5.0)

## 2021-12-19 MED ORDER — KETOROLAC TROMETHAMINE 15 MG/ML IJ SOLN
15.0000 mg | Freq: Three times a day (TID) | INTRAMUSCULAR | Status: AC
  Administered 2021-12-19 – 2021-12-24 (×15): 15 mg via INTRAVENOUS
  Filled 2021-12-19 (×15): qty 1

## 2021-12-19 MED ORDER — TRAVASOL 10 % IV SOLN
INTRAVENOUS | Status: AC
  Filled 2021-12-19: qty 954

## 2021-12-19 MED ORDER — HYDROMORPHONE HCL 1 MG/ML IJ SOLN
0.5000 mg | INTRAMUSCULAR | Status: DC | PRN
  Administered 2021-12-19 – 2021-12-21 (×5): 1 mg via INTRAVENOUS
  Filled 2021-12-19 (×5): qty 1

## 2021-12-19 MED ORDER — OXYCODONE HCL 5 MG PO TABS
5.0000 mg | ORAL_TABLET | ORAL | Status: DC | PRN
  Administered 2021-12-19 – 2021-12-28 (×24): 10 mg via ORAL
  Filled 2021-12-19 (×26): qty 2

## 2021-12-19 NOTE — Progress Notes (Addendum)
[facility] ?Progress Note ? ?4 Days Post-Op  ?Subjective: ?Continues to report significant pain. Up in the chair this morning, ambulated. No nausea, colostomy productive of stool. ? ?Objective: ?Vital signs in last 24 hours: ?Temp:  [98.4 ?F (36.9 ?C)-99.6 ?F (37.6 ?C)] 98.7 ?F (37.1 ?C) (04/16 3358) ?Pulse Rate:  [80-91] 87 (04/16 0836) ?Resp:  [16-18] 16 (04/16 0836) ?BP: (136-157)/(62-73) 149/62 (04/16 0836) ?SpO2:  [86 %-95 %] 95 % (04/16 0836) ?Last BM Date : 12/14/21 ? ?Intake/Output from previous day: ?04/15 0701 - 04/16 0700 ?In: 974.9 [I.V.:974.9] ?Out: 2580 [Urine:2300; Emesis/NG output:250; Drains:30] ?Intake/Output this shift: ?No intake/output data recorded. ? ?PE: ?Gen: Alert, NAD ?GI: distended but soft, open midline incision beefy red with no cellulitis or purulent drainage. LUQ colostomy is productive of stool. RLQ drain is serosanguinous, transgluteal drain output is purulent. ? ?Lab Results:  ?Recent Labs  ?  12/18/21 ?0405 12/19/21 ?0303  ?WBC 10.5 15.2*  ?HGB 7.3* 7.8*  ?HCT 23.9* 24.6*  ?PLT 559* 601*  ? ?BMET ?Recent Labs  ?  12/18/21 ?0405 12/19/21 ?0303  ?NA 140 134*  ?K 3.9 4.0  ?CL 103 98  ?CO2 33* 30  ?GLUCOSE 114* 122*  ?BUN 8 8  ?CREATININE 0.35* 0.35*  ?CALCIUM 8.5* 8.1*  ? ?PT/INR ?No results for input(s): LABPROT, INR in the last 72 hours. ?CMP  ?   ?Component Value Date/Time  ? NA 134 (L) 12/19/2021 0303  ? K 4.0 12/19/2021 0303  ? CL 98 12/19/2021 0303  ? CO2 30 12/19/2021 0303  ? GLUCOSE 122 (H) 12/19/2021 0303  ? BUN 8 12/19/2021 0303  ? CREATININE 0.35 (L) 12/19/2021 0303  ? CALCIUM 8.1 (L) 12/19/2021 0303  ? PROT 5.5 (L) 12/16/2021 0540  ? ALBUMIN 1.6 (L) 12/19/2021 0303  ? AST 15 12/16/2021 0540  ? ALT 16 12/16/2021 0540  ? ALKPHOS 52 12/16/2021 0540  ? BILITOT 0.3 12/16/2021 0540  ? GFRNONAA >60 12/19/2021 0303  ? GFRAA  02/27/2010 2026  ?  >60        ?The eGFR has been calculated ?using the MDRD equation. ?This calculation has not been ?validated in all  clinical ?situations. ?eGFR's persistently ?<60 mL/min signify ?possible Chronic Kidney Disease.  ? ?Lipase  ?No results found for: LIPASE ? ? ? ? ?Studies/Results: ?No results found. ? ?Anti-infectives: ?Anti-infectives (From admission, onward)  ? ? Start     Dose/Rate Route Frequency Ordered Stop  ? 12/15/21 2200  piperacillin-tazobactam (ZOSYN) IVPB 3.375 g       ? 3.375 g ?12.5 mL/hr over 240 Minutes Intravenous Every 8 hours 12/15/21 1457 12/20/21 2159  ? 12/08/21 1500  piperacillin-tazobactam (ZOSYN) IVPB 3.375 g  Status:  Discontinued       ? 3.375 g ?12.5 mL/hr over 240 Minutes Intravenous Every 8 hours 12/08/21 1414 12/15/21 1457  ? 11/27/21 2300  piperacillin-tazobactam (ZOSYN) IVPB 3.375 g       ? 3.375 g ?12.5 mL/hr over 240 Minutes Intravenous Every 8 hours 11/27/21 2202 12/04/21 2025  ? ?  ? ? ? ?Assessment/Plan ?-Sigmoid diverticulitis with pelvic abscesses, multiple small bowel interloop abscess, failed nonoperative management with percutaneous drains.  ?-POD#2 S/p exploratory laparotomy, small bowel resection, drainage of intraabdominal abscesses, and diverting loop colostomy on 12/15/21. ?- Continue Zosyn for 5 days postop (today is day 4/5) ?- NG removed this morning, advance to clear liquid diet ?- continue TPN until reliably tolerating a diet ?- continue surgical JP drain and IR transgluteal drain, flush IR  drain ?- encouraged mobilization, PT/OT ?- Wound vac to midline ?- WOC following for ostomy teaching and care. Plan to remove red rubber catheter POD#7 (12/22/21) ?- Multimodal pain control: prn dilaudid; continue scheduled IV tylenol, toradol and robaxin. ?  ?FEN: TPN, clear liquid diet ?ID: Zosyn 3/25>>  ?VTE: SCD's, lovenox ?Foley: out 4/13 and voiding ?  ?ABL anemia - Hgb stable ?Small bilateral pleural effusions with atelectasis ?Bipolar disorder, home meds.  ?Tobacco abuse ? ? LOS: 22 days  ? ? ?Dwan Bolt, MD ?[facility] ?12/19/2021, 12:15 PM ?Please see Amion for pager  number during day hours 7:00am-4:30pm ? ?

## 2021-12-19 NOTE — Progress Notes (Signed)
Mobility Specialist: Progress Note ? ? 12/19/21 1142  ?Mobility  ?Activity Ambulated independently in room  ?Level of Assistance Standby assist, set-up cues, supervision of patient - no hands on  ?Assistive Device None  ?Distance Ambulated (ft) 320 ft  ?Activity Response Tolerated well  ?$Mobility charge 1 Mobility  ? ?Pt received in chair and agreeable to ambulation. C/o abdominal pain during session, no rating given. No c/o dizziness or feeling light headed. To BSC at end of ambulation, void successful, and then back to the recliner. Pt has call bell and phone at her side.  ? ?Cristal Deer Nathanial Arrighi ?Mobility Specialist ?Mobility Specialist 5 North: [phone] ?Mobility Specialist 6 North: [phone] ? ?

## 2021-12-19 NOTE — Progress Notes (Signed)
PHARMACY - TOTAL PARENTERAL NUTRITION CONSULT NOTE ? ?Indication: Prolonged ileus ? ?Patient Measurements: ?Height: 5\' 4"  (162.6 cm) ?Weight: 82.6 kg (182 lb) ?IBW/kg (Calculated) : 54.7 ?TPN AdjBW (KG): 61.7 ?Body mass index is 31.24 kg/m?. ? ?Assessment:  ?55 YOF presented on 3/25 with pelvic abscess 2/2 sigmoid colon perforation, intra-abdominal fluid collections and ileus.  IR placed pelvic drains x2 on 3/26.  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  No clinical improvement and repeat CT on 4/11 shows large abscess and persistent pelvic fluid collection.  Underwent ex-lap with SBR and colostomy on 4/12.  Pharmacy consulted to manage TPN. ? ?Patient was NPO on admit, then advanced to CLD on 3/29, FLD on 3/31 and soft diet on 4/3 through 4/12.  Per RD documentation, intake was 50-80% of patient's needs. ? ?Glucose / Insulin: no hx DM - glucose < 180  ?2 units insulin used in 24h, received Decadron 10 mg on 4/12 ?Electrolytes: Na 134, K 4 (goal >/= 4), Mag 2 (goal >/= 2), Phos up 3, CoCa ~10 (removed from TPN 4/15), others WNL ?Renal: SCr < 1, BUN WNL ?Hepatic: LFTs / tbili / TG WNL, albumin 1.6 ?Intake / Output; MIVF: UOP 1.2 ml/kg/hr, drain 22ml, NGT 290ml, NS KVO ?GI Imaging: none since TPN start ?GI Surgeries / Procedures: none since TPN start ? ?Central access: double lumen PICC 4/13 ?TPN start date: 12/16/21 ? ?Nutritional Goals:  RD Estimated Needs ?Total Energy Estimated Needs: 1800-2000 ?Total Protein Estimated Needs: 90-100 ?Total Fluid Estimated Needs: >/= 2 L ? ?Current Nutrition:  ?NPO and TPN ? ?Plan:  ?Continue TPN at goal rate 75 ml/hr at 1800 to provide 95g AA, 261g CHO and 56g ILE for a total of 1827 kCal, meeting 100% of needs ?Electrolytes in TPN: incr Na 21mEq/L, K 36mEq/L, removed Ca, incr Mag 23mEq/L, Phos 23mmol/L, Change to Cl:Ac 1:1  ?Daily multivitamin and trace elements in TPN ?Continue sensitive SSI Q6H - consider d/c if continued minimal  use ?Monitor standard TPN labs Mon/Thurs ? ?Thank you for involving pharmacy in this patient's care. ? ?Renold Genta, PharmD, BCPS ?Clinical Pharmacist ?Clinical phone for 12/19/2021 until 3p is [phone] ?12/19/2021 7:26 AM ? ?**Pharmacist phone directory can be found on [email or web address] listed under [place]** ? ?

## 2021-12-19 NOTE — Progress Notes (Addendum)
Dr Mitzi Davenport at bedside, pt sitting in recliner, ngt removed by md. Informed md of purulent bloody drainage for bilateral jp drains, letf more purulent than rt. Md acknowledge awareness and explain this was to be anticipated ?

## 2021-12-20 LAB — MAGNESIUM: Magnesium: 1.9 mg/dL (ref 1.7–2.4)

## 2021-12-20 LAB — CBC
HCT: 24 % — ABNORMAL LOW (ref 36.0–46.0)
Hemoglobin: 7.6 g/dL — ABNORMAL LOW (ref 12.0–15.0)
MCH: 27.5 pg (ref 26.0–34.0)
MCHC: 31.7 g/dL (ref 30.0–36.0)
MCV: 87 fL (ref 80.0–100.0)
Platelets: 574 K/uL — ABNORMAL HIGH (ref 150–400)
RBC: 2.76 MIL/uL — ABNORMAL LOW (ref 3.87–5.11)
RDW: 15.9 % — ABNORMAL HIGH (ref 11.5–15.5)
WBC: 16.5 K/uL — ABNORMAL HIGH (ref 4.0–10.5)
nRBC: 0 % (ref 0.0–0.2)

## 2021-12-20 LAB — GLUCOSE, CAPILLARY
Glucose-Capillary: 118 mg/dL — ABNORMAL HIGH (ref 70–99)
Glucose-Capillary: 134 mg/dL — ABNORMAL HIGH (ref 70–99)
Glucose-Capillary: 99 mg/dL (ref 70–99)

## 2021-12-20 LAB — COMPREHENSIVE METABOLIC PANEL
ALT: 20 U/L (ref 0–44)
AST: 21 U/L (ref 15–41)
Albumin: 1.6 g/dL — ABNORMAL LOW (ref 3.5–5.0)
Alkaline Phosphatase: 65 U/L (ref 38–126)
Anion gap: 6 (ref 5–15)
BUN: 13 mg/dL (ref 6–20)
CO2: 27 mmol/L (ref 22–32)
Calcium: 8.2 mg/dL — ABNORMAL LOW (ref 8.9–10.3)
Chloride: 100 mmol/L (ref 98–111)
Creatinine, Ser: 0.37 mg/dL — ABNORMAL LOW (ref 0.44–1.00)
GFR, Estimated: >60 mL/min (ref >60)
Glucose, Bld: 120 mg/dL — ABNORMAL HIGH (ref 70–99)
Potassium: 4.3 mmol/L (ref 3.5–5.1)
Sodium: 133 mmol/L — ABNORMAL LOW (ref 135–145)
Total Bilirubin: 0.4 mg/dL (ref 0.3–1.2)
Total Protein: 5.7 g/dL — ABNORMAL LOW (ref 6.5–8.1)

## 2021-12-20 LAB — TRIGLYCERIDES: Triglycerides: 93 mg/dL

## 2021-12-20 LAB — PHOSPHORUS: Phosphorus: 2.5 mg/dL (ref 2.5–4.6)

## 2021-12-20 MED ORDER — MAGNESIUM SULFATE 2 GM/50ML IV SOLN
2.0000 g | Freq: Once | INTRAVENOUS | Status: AC
  Administered 2021-12-20: 2 g via INTRAVENOUS
  Filled 2021-12-20: qty 50

## 2021-12-20 MED ORDER — TRAVASOL 10 % IV SOLN
INTRAVENOUS | Status: AC
  Filled 2021-12-20: qty 954

## 2021-12-20 MED ORDER — ACETAMINOPHEN 500 MG PO TABS
1000.0000 mg | ORAL_TABLET | Freq: Four times a day (QID) | ORAL | Status: DC
  Administered 2021-12-20 – 2021-12-28 (×30): 1000 mg via ORAL
  Filled 2021-12-20 (×32): qty 2

## 2021-12-20 MED ORDER — METHOCARBAMOL 500 MG PO TABS
500.0000 mg | ORAL_TABLET | Freq: Three times a day (TID) | ORAL | Status: DC | PRN
  Administered 2021-12-21 – 2021-12-28 (×10): 500 mg via ORAL
  Filled 2021-12-20 (×10): qty 1

## 2021-12-20 NOTE — Progress Notes (Signed)
?  One IR drain remains ?LLQ abscess drain in place ? ? ?Drain Location: LLQ ?Size: Fr size: 10 Fr ?Date of placement: 11/28/21  ?Currently to: Drain collection device: suction bulb ?24 hour output:  ?Output by Drain (mL) 12/18/21 0701 - 12/18/21 1900 12/18/21 1901 - 12/19/21 0700 12/19/21 0701 - 12/19/21 1900 12/19/21 1901 - 12/20/21 0700 12/20/21 0701 - 12/20/21 1028  ?Closed System Drain 2 Left Buttock Bulb (JP) 10 Fr.  20  40   ?Closed System Drain 1 Right Abdomen Bulb (JP) 19 Fr.  10  10   ?Negative Pressure Wound Therapy Abdomen Medial  0 0 0   ? ? ?Interval imaging/drain manipulation:  ?12/14/21 CT:  IMPRESSION: ?1. Redemonstrated circumferential wall thickening of the mid to ?distal sigmoid colon, in keeping with diverticulitis. ?2. There are multiple, probably contiguous air and fluid collections ?in the low pelvis, which are increased in volume compared to prior ?examination. Two pigtail drainage catheters are positioned in the ?pelvis. ?3. Unchanged large interloop fluid collection in the central small ?bowel mesentery. ?4. Interval placement of a pigtail drainage catheter within a ?previously seen collection at the tip of the right lobe of the ?liver, which is now completely resolved. ?5. Additional smaller fluid collections are unchanged. ?6. Mild bilateral hydronephrosis and hydroureter, similar to prior ?examination, presumably secondary to obstruction of the distal ?ureters by fluid collections in the low pelvis. ?7. Anasarca. ? ?Current examination: ?Flushes/aspirates easily.  ?Insertion site unremarkable. ?Suture and stat lock in place. ?Dressed appropriately.  ? ?OP remains 20-40 cc daily ?Organism ID, Bacteria KLEBSIELLA PNEUMONIAE   ?Organism ID, Bacteria ENTEROCOCCUS FAECIUM   ?Organism ID, Bacteria PSEUDOMONAS AERUGINOSA  ? ? ? ?Plan: ?Continue TID flushes with 5 cc NS. ?Record output Q shift. ?Dressing changes QD or PRN if soiled.  ?Call IR APP or on call IR MD if difficulty flushing or sudden  change in drain output.  ?Repeat imaging/possible drain injection once output < 10 mL/QD (excluding flush material.) ? ?Discharge planning: ?Please contact IR APP or on call IR MD prior to patient d/c to ensure appropriate follow up plans are in place. Typically patient will follow up with IR clinic 10-14 days post d/c for repeat imaging/possible drain injection. IR scheduler will contact patient with date/time of appointment. Patient will need to flush drain QD with 5 cc NS, record output QD, dressing changes every 2-3 days or earlier if soiled.  ? ?IR will continue to follow - please call with questions or concerns. ? ?  ?

## 2021-12-20 NOTE — Consult Note (Addendum)
WOC Nurse ostomy follow up ?Stoma type/location: LMQ colostomy with red rubber catheter retention in place.  I have removed this per surgery recommendations.  ?Stomal assessment/size: 1 1/2" pale pink and moist  Producing liquid brown stool ?Peristomal assessment: intact midline abdominal wound with NPWT VAC therapy that will be changed today.  ?Treatment options for stomal/peristomal skin: barrier ring and 1 piece convex pouch.  Recommend ostomy belt due to slightly budded stoma ?Output liquid brown stool ?Ostomy pouching: 1pc.convex and barrier ring  will order belt ?Education provided: Today, patient is able to observe measuring and cutting pouch out.  She is hands on with application of pouch and can roll closed.  ENcouraged to practice emptying when she is up to toilet with nurse tech.  Is gaining independence ?Enrolled patient in [place] [employer] Discharge program: Yes ?WOC Nurse wound follow up ?Wound type:midline abdominal wound with NPWT (VAC) in place.  ?Measurement: 20 cm x 6 cm x 4 cm  ?Wound bed: beefy red and moist bleeds with cleansing ?Drainage (amount, consistency, odor) minimal serosanguinous  no odor ?Periwound:intact LMQ colostomy ?Dressing procedure/placement/frequency: Cleanse midline wound with NS and pat dry. Applied 2 pieces black foam to wound bed.  Cover with drape.  Change mon/Wed/Fri. ?Will follow.  ?Maple Hudson MSN, RN, FNP-BC CWON ?Wound, Ostomy, Continence Nurse ?Pager [phone]  ?

## 2021-12-20 NOTE — Progress Notes (Signed)
5 Days Post-Op  ? ?Subjective/Chief Complaint: ?Just had vac and appliance changed per her, had output in bag, tol clears, no n/v, pain much better today, oob to chair yesterday ? ? ?Objective: ?Vital signs in last 24 hours: ?Temp:  [98.7 ?F (37.1 ?C)-100.2 ?F (37.9 ?C)] 98.7 ?F (37.1 ?C) (04/17 0757) ?Pulse Rate:  [78-93] 78 (04/17 0757) ?Resp:  [17-18] 18 (04/17 0757) ?BP: (118-137)/(52-60) 118/52 (04/17 0757) ?SpO2:  [90 %-94 %] 90 % (04/17 0757) ?Last BM Date : 12/19/21 ? ?Intake/Output from previous day: ?04/16 0701 - 04/17 0700 ?In: 4004.1 [P.O.:200; I.V.:2855.1; IV Piggyback:949] ?Out: 50 [Drains:50] ?Intake/Output this shift: ?Total I/O ?In: 150 [P.O.:150] ?Out: -  ? ?Gen: Alert, NAD ?GI:  wound with vac in place, LUQ colostomy is productive of stool. Right drain serosang, transgluteal drain output is purulent at minimum ? ?Lab Results:  ?Recent Labs  ?  12/19/21 ?0303 12/20/21 ?0341  ?WBC 15.2* 16.5*  ?HGB 7.8* 7.6*  ?HCT 24.6* 24.0*  ?PLT 601* 574*  ? ?BMET ?Recent Labs  ?  12/19/21 ?0303 12/20/21 ?0341  ?NA 134* 133*  ?K 4.0 4.3  ?CL 98 100  ?CO2 30 27  ?GLUCOSE 122* 120*  ?BUN 8 13  ?CREATININE 0.35* 0.37*  ?CALCIUM 8.1* 8.2*  ? ?PT/INR ?No results for input(s): LABPROT, INR in the last 72 hours. ?ABG ?No results for input(s): PHART, HCO3 in the last 72 hours. ? ?Invalid input(s): PCO2, PO2 ? ?Studies/Results: ?No results found. ? ?Anti-infectives: ?Anti-infectives (From admission, onward)  ? ? Start     Dose/Rate Route Frequency Ordered Stop  ? 12/15/21 2200  piperacillin-tazobactam (ZOSYN) IVPB 3.375 g       ? 3.375 g ?12.5 mL/hr over 240 Minutes Intravenous Every 8 hours 12/15/21 1457 12/20/21 2159  ? 12/08/21 1500  piperacillin-tazobactam (ZOSYN) IVPB 3.375 g  Status:  Discontinued       ? 3.375 g ?12.5 mL/hr over 240 Minutes Intravenous Every 8 hours 12/08/21 1414 12/15/21 1457  ? 11/27/21 2300  piperacillin-tazobactam (ZOSYN) IVPB 3.375 g       ? 3.375 g ?12.5 mL/hr over 240 Minutes Intravenous  Every 8 hours 11/27/21 2202 12/04/21 2025  ? ?  ? ? ?Assessment/Plan: ?Sigmoid diverticulitis with pelvic abscesses, multiple small bowel interloop abscess, failed nonoperative management with percutaneous drains.  ?-POD #5 S/p exploratory laparotomy, small bowel resection, drainage of intraabdominal abscesses, and diverting loop colostomy on 12/15/21. ?- Continue Zosyn for now as not entirely sure infection cleared with drain outputs, may repeat ct scan next 48 hours to assess pelvic drain and whatever collection may be left ?- full  liquid diet ?- continue TPN until reliably tolerating a diet ?- continue surgical JP drain and IR transgluteal drain, flush IR drain ?- encouraged mobilization, PT/OT ?- Wound vac to midline ?- WOC following for ostomy teaching and care. Plan to remove red rubber catheter POD#7 (12/22/21) ?- Multimodal pain control: prn dilaudid; continue scheduled toradol for another 24 hours and will recheck cr in am and robaxin.add oral tylenol ? ?FEN: TPN, full liquid diet- if tolerates can half tpn ?ID: Zosyn 3/25>>  ?VTE: SCD's, lovenox ?Foley: out 4/13 and voiding with purewick ?  ?ABL anemia - Hgb stable ?Small bilateral pleural effusions with atelectasis ?Bipolar disorder, home meds.  ?Tobacco abuse ? ?Judith Miller ?12/20/2021 ? ?

## 2021-12-20 NOTE — Progress Notes (Signed)
Occupational Therapy Treatment ?Patient Details ?Name: Judith Miller ?MRN: [id number] ?DOB: [birth year] ?Today's Date: 12/20/2021 ? ? ?History of present illness 61 y.o. female presents to [facility] hospital 11/27/2021 as a transfer from [facility] with perforated sigmoid colon. Pt underwent CT guided pelvic drain placement on 3/26. 12/15/21 underwent ex lap with SBRand colostomy with placement of wound vac due to large abscess and persistent pelvic fluid collection.  PMH includes bipolar disorder. ?  ?OT comments ? Pt assisted to use bathroom and groom at sink with min guard assist with increased time. Pt able to cross foot over opposite knee to reach feet to don and doff socks. Pt resistant to using log roll technique to minimize pain. Pt stating she is concerned she will not be able to learn how to manage her colostomy.   ? ?Recommendations for follow up therapy are one component of a multi-disciplinary discharge planning process, led by the attending physician.  Recommendations may be updated based on patient status, additional functional criteria and insurance authorization. ?   ?Follow Up Recommendations ? Home health OT  ?  ?Assistance Recommended at Discharge Frequent or constant Supervision/Assistance  ?Patient can return home with the following ? [name];Assist for transportation;[name];A lot of help with bathing/dressing/bathroom;Assistance with cooking/housework ?  ?Equipment Recommendations ? BSC/3in1  ?  ?Recommendations for Other Services   ? ?  ?Precautions / Restrictions Precautions ?Precautions: Fall ?Precaution Comments: JP drain x 2, new colostomy, wound vac, abdominal precautions for comfort ?Restrictions ?Weight Bearing Restrictions: No  ? ? ?  ? ?Mobility Bed Mobility ?Overal bed mobility: Needs Assistance ?Bed Mobility: Supine to Sit ?  ?  ?Supine to sit: Min assist ?  ?  ?General bed mobility comments: pulled up on therapist's  hand, resistant to education on log roll technique ?  ? ?Transfers ?Overall transfer level: Needs assistance ?Equipment used: None ?Transfers: Sit to/from Stand ?Sit to Stand: Min guard ?  ?  ?  ?  ?  ?General transfer comment: slow to rise ?  ?  ?Balance Overall balance assessment: Needs assistance ?  ?Sitting balance-Leahy Scale: Good ?Sitting balance - Comments: no LOB with donning socks ?  ?Standing balance support: No upper extremity supported, During functional activity ?Standing balance-Leahy Scale: Fair ?  ?  ?  ?  ?  ?  ?  ?  ?  ?  ?  ?  ?   ? ?ADL either performed or assessed with clinical judgement  ? ?ADL Overall ADL's : Needs assistance/impaired ?Eating/Feeding: Independent ?  ?Grooming: Wash/dry hands;Standing;Min guard ?  ?  ?  ?  ?  ?  ?  ?Lower Body Dressing: Set up;Sitting/lateral leans ?Lower Body Dressing Details (indicate cue type and reason): able to cross foot over opposite knee to reach socks ?Toilet Transfer: Min guard;Ambulation;Comfort height toilet ?  ?Toileting- Clothing Manipulation and Hygiene: Sit to/from stand ?  ?  ?  ?Functional mobility during ADLs: Min guard ?  ?  ? ?Extremity/Trunk Assessment   ?  ?  ?  ?  ?  ? ?Vision   ?  ?  ?Perception   ?  ?Praxis   ?  ? ?Cognition Arousal/Alertness: Awake/alert ?Behavior During Therapy: [facility] for tasks assessed/performed ?Overall Cognitive Status: Within Functional Limits for tasks assessed ?  ?  ?  ?  ?  ?  ?  ?  ?  ?  ?  ?  ?  ?  ?  ?  ?  General Comments: much less anxious vs last week ?  ?  ?   ?Exercises   ? ?  ?Shoulder Instructions   ? ? ?  ?General Comments    ? ? ?Pertinent Vitals/ Pain       Pain Assessment ?Pain Assessment: Faces ?Faces Pain Scale: Hurts even more ?Pain Location: abdomen ?Pain Descriptors / Indicators: Guarding, Grimacing ?Pain Intervention(s): Monitored during session, Premedicated before session, Repositioned ? ?Home Living   ?  ?  ?  ?  ?  ?  ?  ?  ?  ?  ?  ?  ?  ?  ?  ?  ?  ?  ? ?  ?Prior Functioning/Environment     ?  ?  ?  ?   ? ?Frequency ? Min 2X/week  ? ? ? ? ?  ?Progress Toward Goals ? ?OT Goals(current goals can now be found in the care plan section) ? Progress towards OT goals: Progressing toward goals ? ?Acute Rehab OT Goals ?OT Goal Formulation: With patient ?Time For Goal Achievement: 12/31/21 ?Potential to Achieve Goals: Good  ?Plan Discharge plan remains appropriate   ? ?Co-evaluation ? ? ?   ?  ?  ?  ?  ? ?  ?AM-PAC OT "6 Clicks" Daily Activity     ?Outcome Measure ? ? Help from another person eating meals?: None ?Help from another person taking care of personal grooming?: A Little ?Help from another person toileting, which includes using toliet, bedpan, or urinal?: A Little ?Help from another person bathing (including washing, rinsing, drying)?: A Lot ?Help from another person to put on and taking off regular upper body clothing?: A Little ?Help from another person to put on and taking off regular lower body clothing?: A Little ?6 Click Score: 18 ? ?  ?End of Session   ? ?OT Visit Diagnosis: Unsteadiness on feet (R26.81);Other abnormalities of gait and mobility (R26.89);Pain;Muscle weakness (generalized) (M62.81) ?  ?Activity Tolerance Patient tolerated treatment well ?  ?Patient Left in bed;with call bell/phone within reach ?  ?Nurse Communication   ?  ? ?   ? ?Time: 8250-5397 ?OT Time Calculation (min): 24 min ? ?Charges: OT General Charges ?$OT Visit: 1 Visit ?OT Treatments ?$Self Care/Home Management : 23-37 mins ? ?Martie Round, OTR/L ?Acute Rehabilitation Services ?Pager: [phone] ?Office: [phone]  ? ?Evern Bio ?12/20/2021, 3:52 PM ?

## 2021-12-20 NOTE — Progress Notes (Signed)
Physical Therapy Treatment ?Patient Details ?Name: Judith Miller ?MRN: [id number] ?DOB: 1961-04-21 ?Today's Date: 12/20/2021 ? ? ?History of Present Illness 61 y.o. female presents to [facility] hospital 11/27/2021 as a transfer from [facility] with perforated sigmoid colon. Pt underwent CT guided pelvic drain placement on 3/26. 12/15/21 underwent ex lap with SBRand colostomy with placement of wound vac due to large abscess and persistent pelvic fluid collection.  PMH includes bipolar disorder. ? ?  ?PT Comments  ? ? Pt admitted with above diagnosis. Pt was able to ambulate without device with min guard assist with slow guarded gait. Pt progressing overall but is still limited by pain.  Pt has met 0/7 goals due to slower progress than anticipated and pt with pain issues.  Revised goals today. Pt currently with functional limitations due to balance and endurance deficits. Pt will benefit from skilled PT to increase their independence and safety with mobility to allow discharge to the venue listed below.      ?Recommendations for follow up therapy are one component of a multi-disciplinary discharge planning process, led by the attending physician.  Recommendations may be updated based on patient status, additional functional criteria and insurance authorization. ? ?Follow Up Recommendations ? Home health PT ?  ?  ?Assistance Recommended at Discharge Intermittent Supervision/Assistance  ?Patient can return home with the following [name];[name];Assistance with cooking/housework;Assist for transportation;[name] ?  ?Equipment Recommendations ? Rolling walker (2 wheels)  ?  ?Recommendations for Other Services   ? ? ?  ?Precautions / Restrictions Precautions ?Precautions: Fall ?Precaution Comments: JP drain x 2, new colostomy, wound vac, abdominal precautions for comfort ?Restrictions ?Weight Bearing Restrictions: No  ?   ? ?Mobility ? Bed Mobility ?  ?  ?  ?  ?  ?  ?  ?General bed mobility comments: in chair on arrival ?  ? ?Transfers ?Overall transfer level: Needs assistance ?Equipment used: None ?Transfers: Sit to/from Stand, Bed to chair/wheelchair/BSC ?Sit to Stand: Min guard ?  ?  ?  ?  ?  ?General transfer comment: pt adamant she will not use RW ?  ? ?Ambulation/Gait ?Ambulation/Gait assistance: Min guard ?Gait Distance (Feet): 240 Feet ?Assistive device: None ?Gait Pattern/deviations: Step-through pattern, Decreased stride length ?Gait velocity: reduced ?Gait velocity interpretation: <1.31 ft/sec, indicative of household ambulator ?  ?General Gait Details: slow and guarded gait but progressing well. Did not need assist and no LOB with min challenges. ? ? ?Stairs ?  ?  ?  ?  ?  ? ? ?Wheelchair Mobility ?  ? ?Modified Rankin (Stroke Patients Only) ?  ? ? ?  ?Balance Overall balance assessment: Needs assistance ?Sitting-balance support: Single extremity supported, Bilateral upper extremity supported ?Sitting balance-Leahy Scale: Fair ?  ?  ?Standing balance support: No upper extremity supported, During functional activity ?Standing balance-Leahy Scale: Fair ?Standing balance comment: min guard assist statically ?  ?  ?  ?  ?  ?  ?  ?  ?  ?  ?  ?  ? ?  ?Cognition Arousal/Alertness: Awake/alert ?Behavior During Therapy: Anxious ?Overall Cognitive Status: Within Functional Limits for tasks assessed ?  ?  ?  ?  ?  ?  ?  ?  ?  ?  ?  ?  ?  ?  ?  ?  ?  ?  ?  ? ?  ?Exercises General Exercises - Lower Extremity ?Ankle Circles/Pumps: AROM, 5 reps ?  Long Arc Quad: AROM, Both, 10 reps, Seated ?Hip Flexion/Marching: AROM, Both, 10 reps, Seated ? ?  ?General Comments   ?  ?  ? ?Pertinent Vitals/Pain Pain Assessment ?Pain Assessment: Faces ?Faces Pain Scale: Hurts whole lot ?Pain Location: abdomen ?Pain Descriptors / Indicators: Guarding, Grimacing ?Pain Intervention(s): Limited activity within patient's tolerance, Monitored during session,  Premedicated before session, Repositioned  ? ? ?Home Living   ?  ?  ?  ?  ?  ?  ?  ?  ?  ?   ?  ?Prior Function    ?  ?  ?   ? ?PT Goals (current goals can now be found in the care plan section) Acute Rehab PT Goals ?PT Goal Formulation: With patient ?Time For Goal Achievement: 01/03/22 ?Potential to Achieve Goals: Good ?Progress towards PT goals: Progressing toward goals ? ?  ?Frequency ? ? ? Min 3X/week ? ? ? ?  ?PT Plan Current plan remains appropriate  ? ? ?Co-evaluation   ?  ?  ?  ?  ? ?  ?AM-PAC PT "6 Clicks" Mobility   ?Outcome Measure ? Help needed turning from your back to your side while in a flat bed without using bedrails?: A Lot ?Help needed moving from lying on your back to sitting on the side of a flat bed without using bedrails?: A Lot ?Help needed moving to and from a bed to a chair (including a wheelchair)?: A Little ?Help needed standing up from a chair using your arms (e.g., wheelchair or bedside chair)?: A Little ?Help needed to walk in hospital room?: A Little ?Help needed climbing 3-5 steps with a railing? : A Little ?6 Click Score: 16 ? ?  ?End of Session   ?Activity Tolerance: Patient limited by pain ?Patient left: with call bell/phone within reach;in chair;with chair alarm set ?Nurse Communication: Mobility status ?PT Visit Diagnosis: Other abnormalities of gait and mobility (R26.89);Muscle weakness (generalized) (M62.81);Pain ?Pain - Right/Left:  (abdomen) ?Pain - part of body:  (abdomen) ?  ? ? ?Time: 1142-1200 ?PT Time Calculation (min) (ACUTE ONLY): 18 min ? ?Charges:  $Gait Training: 8-22 mins          ?          ? ?Vincenzo Stave M,PT ?Acute Rehab Services ?[phone] ?[phone] (pager)  ? ? ?Karanvir Balderston F Sneha Willig ?12/20/2021, 2:13 PM ? ?

## 2021-12-20 NOTE — Progress Notes (Signed)
PHARMACY - TOTAL PARENTERAL NUTRITION CONSULT NOTE ? ?Indication: Prolonged ileus ? ?Patient Measurements: ?Height: 5\' 4"  (162.6 cm) ?Weight: 82.6 kg (182 lb) ?IBW/kg (Calculated) : 54.7 ?TPN AdjBW (KG): 61.7 ?Body mass index is 31.24 kg/m?. ? ?Assessment:  ?4 YOF presented on 3/25 with pelvic abscess 2/2 sigmoid colon perforation, intra-abdominal fluid collections and ileus.  IR placed pelvic drains x2 on 3/26.  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  No clinical improvement and repeat CT on 4/11 shows large abscess and persistent pelvic fluid collection.  Underwent ex-lap with SBR and colostomy on 4/12.  Pharmacy consulted to manage TPN. ? ?Patient was NPO on admit, then advanced to CLD on 3/29, FLD on 3/31 and soft diet on 4/3 through 4/12.  Per RD documentation, intake was 50-80% of patient's needs. ? ?Glucose / Insulin: no hx DM - glucose < 180  ?2 units insulin used in 24h ?Electrolytes: Na 133, K 4.3 (goal >/= 4), Mag 1.9 (goal >/= 2), CoCa ~10.1 (removed from TPN 4/15), others WNL ?Renal: SCr < 1, BUN WNL ?Hepatic: LFTs / tbili / TG WNL, albumin 1.6 ?Intake / Output; MIVF: UOP 0.96 ml/kg/hr, drain 43ml, LBM 4/16, NS KVO ?GI Imaging: none since TPN start ?GI Surgeries / Procedures: none since TPN start ? ?Central access: double lumen PICC 4/13 ?TPN start date: 12/16/21 ? ?Nutritional Goals:  RD Estimated Needs ?Total Energy Estimated Needs: 1800-2000 ?Total Protein Estimated Needs: 90-100 ?Total Fluid Estimated Needs: >/= 2 L ? ?Current Nutrition:  ?NPO and Clear liquids ?Pt reports eating and tolerating a cup of broth and a cup of jello at lunch and dinner on 4/16 along with 2 cups of water, 1 cup of apple juice, and 1 ice pop with dinner.  ?Pt ate all of her cup of broth, 75% of jello, and was drinking 1 cup of coffee this morning, 4/17. ? ?Plan:  ?Continue TPN at goal rate 75 ml/hr at 1800 to provide 95g AA, 261g CHO and 56g ILE for a total of 1827 kCal,  meeting 100% of needs ?Electrolytes in TPN: Increase:  Na 67mEq/L, Phos 47mmol/L, Continue: K 73mEq/L, removed Ca, incr Mag 79mEq/L, Phos 13mmol/L, Cl:Ac 1:1  ?Daily multivitamin and trace elements in TPN ?Give magnesium sulfate IV 2g x 1 outside of TPN ?Continue sensitive SSI Q6H - consider d/c if continued minimal use ?Monitor standard TPN labs daily until stable on TPN, then every Mon/Thurs ? ?Thank you for involving pharmacy in this patient's care. ? ?16m, PharmD, BCPS ?Clinical Pharmacist ?12/20/2021 8:52 AM  ? ?Please refer to [facility] for pharmacy phone number  ? ?

## 2021-12-20 NOTE — Plan of Care (Signed)

## 2021-12-21 ENCOUNTER — Inpatient Hospital Stay (HOSPITAL_COMMUNITY): Payer: Self-pay

## 2021-12-21 LAB — CBC
HCT: 23.7 % — ABNORMAL LOW (ref 36.0–46.0)
Hemoglobin: 7.3 g/dL — ABNORMAL LOW (ref 12.0–15.0)
MCH: 26.9 pg (ref 26.0–34.0)
MCHC: 30.8 g/dL (ref 30.0–36.0)
MCV: 87.5 fL (ref 80.0–100.0)
Platelets: 516 10*3/uL — ABNORMAL HIGH (ref 150–400)
RBC: 2.71 MIL/uL — ABNORMAL LOW (ref 3.87–5.11)
RDW: 15.9 % — ABNORMAL HIGH (ref 11.5–15.5)
WBC: 12.3 10*3/uL — ABNORMAL HIGH (ref 4.0–10.5)
nRBC: 0 % (ref 0.0–0.2)

## 2021-12-21 LAB — BASIC METABOLIC PANEL
Anion gap: 7 (ref 5–15)
BUN: 14 mg/dL (ref 6–20)
CO2: 28 mmol/L (ref 22–32)
Calcium: 8.2 mg/dL — ABNORMAL LOW (ref 8.9–10.3)
Chloride: 97 mmol/L — ABNORMAL LOW (ref 98–111)
Creatinine, Ser: <0.3 mg/dL — ABNORMAL LOW (ref 0.44–1.00)
Glucose, Bld: 115 mg/dL — ABNORMAL HIGH (ref 70–99)
Potassium: 4.5 mmol/L (ref 3.5–5.1)
Sodium: 132 mmol/L — ABNORMAL LOW (ref 135–145)

## 2021-12-21 LAB — GLUCOSE, CAPILLARY
Glucose-Capillary: 129 mg/dL — ABNORMAL HIGH (ref 70–99)
Glucose-Capillary: 140 mg/dL — ABNORMAL HIGH (ref 70–99)
Glucose-Capillary: 157 mg/dL — ABNORMAL HIGH (ref 70–99)

## 2021-12-21 MED ORDER — IOHEXOL 9 MG/ML PO SOLN
ORAL | Status: AC
Start: 2021-12-21 — End: 2021-12-21
  Administered 2021-12-21: 500 mL
  Filled 2021-12-21: qty 1000

## 2021-12-21 MED ORDER — TRAVASOL 10 % IV SOLN
INTRAVENOUS | Status: AC
  Filled 2021-12-21: qty 445.2

## 2021-12-21 MED ORDER — IOHEXOL 300 MG/ML  SOLN
100.0000 mL | Freq: Once | INTRAMUSCULAR | Status: AC | PRN
  Administered 2021-12-21: 100 mL via INTRAVENOUS

## 2021-12-21 MED ORDER — ADULT MULTIVITAMIN W/MINERALS CH
1.0000 | ORAL_TABLET | Freq: Every day | ORAL | Status: DC
  Administered 2021-12-22 – 2021-12-28 (×7): 1 via ORAL
  Filled 2021-12-21 (×7): qty 1

## 2021-12-21 MED ORDER — HYDROMORPHONE HCL 1 MG/ML IJ SOLN
0.5000 mg | INTRAMUSCULAR | Status: DC | PRN
  Administered 2021-12-21 – 2021-12-27 (×18): 1 mg via INTRAVENOUS
  Filled 2021-12-21 (×22): qty 1

## 2021-12-21 MED ORDER — GUAIFENESIN ER 600 MG PO TB12: 600.0000 mg | ORAL_TABLET | Freq: Two times a day (BID) | ORAL | Status: DC | PRN

## 2021-12-21 MED ORDER — BOOST / RESOURCE BREEZE PO LIQD CUSTOM
1.0000 | Freq: Three times a day (TID) | ORAL | Status: DC
  Administered 2021-12-21 – 2021-12-28 (×18): 1 via ORAL

## 2021-12-21 NOTE — TOC Progression Note (Signed)
Transition of Care (TOC) - Progression Note  ? ? ?Patient Details  ?Name: Judith Miller ?MRN: [id number] ?Date of Birth: 1960-11-27 ? ?Transition of Care (TOC) CM/SW Contact  ?Kingsley Plan, RN ?Phone Number: ?12/21/2021, 10:53 AM ? ?Clinical Narrative:    ? ?Currently it is [facility] rotation for charity home health. NCM called Victorino Dike with [facility], they do not staff [place] , therefore unable to accept referral for [facility] ? ?Expected Discharge Plan: Home w Home Health Services ?Barriers to Discharge: Continued Medical Work up ? ?Expected Discharge Plan and Services ?Expected Discharge Plan: Home w Home Health Services ?  ?Discharge Planning Services: CM Consult ?Post Acute Care Choice: Home Health ?Living arrangements for the past 2 months: Single Family Home ?                ?DME Arranged: Walker rolling ?DME Agency: [employer] ?  ?  ?  ?HH Arranged: PT ?HH Agency: [facility] ?Date HH Agency Contacted: 12/02/21 ?Time HH Agency Contacted: 1310 ?Representative spoke with at [facility] Agency: Amy ? ? ?Social Determinants of Health (SDOH) Interventions ?  ? ?Readmission Risk Interventions ?   ? View : No data to display.  ?  ?  ?  ? ? ?

## 2021-12-21 NOTE — Plan of Care (Signed)
?  Problem: Nutrition: ?Goal: Adequate nutrition will be maintained ?Outcome: Progressing ?  ?Problem: Safety: ?Goal: Ability to remain free from injury will improve ?Outcome: Progressing ?  ?Problem: Pain Managment: ?Goal: General experience of comfort will improve ?Outcome: Not Progressing ?  ?

## 2021-12-21 NOTE — Progress Notes (Signed)
Nutrition Follow-up ? ?DOCUMENTATION CODES:  ?Not applicable ? ?INTERVENTION:  ?Continue current diet as ordered, advance as able to Soft diet ?Boost Breeze po TID, each supplement provides 250 kcal and 9 grams of protein ?Continue TPN until pt is consistently meeting 75% of needs orally ? ?NUTRITION DIAGNOSIS:  ?Inadequate oral intake related to other (see comment) (restricted diet) as evidenced by NPO status (NPO status x 4 days). ?- remains applicable ? ?GOAL:  ?Patient will meet greater than or equal to 90% of their needs ?- progressing, diet advanced, supplements in place ? ?MONITOR:  ?PO intake, Supplement acceptance, Diet advancement, Labs, Weight trends, I & O's ? ?REASON FOR ASSESSMENT:  ?NPO/Clear Liquid Diet ?  ? ?ASSESSMENT:  ?61 year old female with no relevant medical hx admitted from [facility] after imaging showed a perforated sigmoid colon and pt was in need of radiology interventions. ? ?3/26 - Ct guided drain placement x2 in IR ?4/2 - repeat imaging showed RLQ abscess ?4/4 - additional drain placed in IR ?4/12 - Op, sigmoid colectomy with colostomy placement and wound vac ? ?Pt resting in bedside chair at the time of visit. Feeling discouraged today as she was told that she needs to have repeat imaging to investigate the feculent appearance of one of her drains. Pt is ready to go home.  ? ?Discussed intake since diet advancement. Pt reports that she is consuming 100% of her trays since advancement. Reviewed meal tickets from 4/18. Pt consumed 1079 kcal (~60% of needs) and 19g of protein (~20% of needs) from dining services. Boost breeze added today as an additional source of kcal and protein.  ? ?Discussed nutrition plan with surgery and pharmacy and TPN to be reduced to half rate tonight. Will monitor plan after imaging completed today. Would not recommend discontinuing TPN completely until pt is able to reliable meeting >75% of estimated needs orally. ? ?Average Meal Intake: ?3/30-4/3: 82%  intake x 6 recorded meals ?4/6-4/12: 50% average intake x 6 recorded meals ?4/13-4/18: 75% intake x 2 recorded meals ? ?Nutritionally Relevant Medications: ?Scheduled Meds: ? Boost Breeze  1 Container Oral TID BM  ? pantoprazole IV  40 mg Intravenous Q12H  ? ?Continuous Infusions: ? TPN ADULT (ION) 75 mL/hr at 12/20/21 1900  ? ?PRN Meds: ondansetron ? ?Labs Reviewed  ?Sodium 132, chloride 97 ?CBG ranges from 129-157 mg/dL over the last 24 hours ? ?NUTRITION - FOCUSED PHYSICAL EXAM: ?Flowsheet Row Most Recent Value  ?Orbital Region No depletion  ?Upper Arm Region No depletion  ?Thoracic and Lumbar Region No depletion  ?Buccal Region No depletion  ?Temple Region Mild depletion  ?Clavicle Bone Region No depletion  ?Clavicle and Acromion Bone Region No depletion  ?Scapular Bone Region Mild depletion  ?Dorsal Hand No depletion  ?Patellar Region No depletion  ?Anterior Thigh Region Mild depletion  ?Posterior Calf Region Mild depletion  ?Edema (RD Assessment) None  ?Hair Reviewed  ?Eyes Reviewed  ?Mouth Reviewed  ?Skin Reviewed  ?Nails Reviewed  ? ?Diet Order:   ?Diet Order   ? ?       ?  Diet full liquid Room service appropriate? Yes; Fluid consistency: Thin  Diet effective now       ?  ? ?  ?  ? ?  ? ? ?EDUCATION NEEDS:  ?Education needs have been addressed ? ?Skin:  Skin Assessment: Reviewed RN Assessment ? ?Last BM:  4/11 ? ?Height:  ?Ht Readings from Last 1 Encounters:  ?12/02/21 5\' 4"  (1.626 m)  ? ?  Weight:  ?Wt Readings from Last 1 Encounters:  ?12/02/21 82.6 kg  ? ? ?Ideal Body Weight:  54.5 kg ? ?BMI:  Body mass index is 31.24 kg/m?. ? ?Estimated Nutritional Needs:  ?Kcal:  1800-2000 ?Protein:  90-100 ?Fluid:  >/= 2 L ? ? ?Greig Castilla, RD, LDN ?Clinical Dietitian ?RD pager # available in AMION  ?After hours/weekend pager # available in AMION ?

## 2021-12-21 NOTE — Progress Notes (Signed)
PHARMACY - TOTAL PARENTERAL NUTRITION CONSULT NOTE ? ?Indication: Prolonged ileus ? ?Patient Measurements: ?Height: 5\' 4"  (162.6 cm) ?Weight: 82.6 kg (182 lb) ?IBW/kg (Calculated) : 54.7 ?TPN AdjBW (KG): 61.7 ?Body mass index is 31.24 kg/m?. ? ?Assessment:  ?29 YOF presented on 3/25 with pelvic abscesses 2/2 sigmoid colon perforation, intra-abdominal fluid collections and ileus.  IR placed pelvic drains x2 on 3/26.  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  No clinical improvement and repeat CT on 4/11 shows large abscess and persistent pelvic fluid collection.  Underwent ex-lap with SBR and colostomy on 4/12.  Pharmacy consulted to manage TPN. ? ?Patient was NPO on admit, then advanced to CLD on 3/29, FLD on 3/31 and soft diet on 4/3 through 4/12.  Per RD documentation, intake was 50-80% of patient's needs. ? ?Glucose / Insulin: no hx DM - glucose < 180, used 4 units insulin/24hr   ?Electrolytes: Na 132, K 4.5 up (goal >/= 4), Mag 1.9 (goal >/= 2), CoCa ~10.1 (removed from TPN 4/15), others WNL ?Renal: SCr < 0.3, BUN WNL ?Hepatic: LFTs / tbili / TG WNL, albumin 1.6 ?Intake / Output; MIVF: UOP 0.6 ml/kg/hr, drains 5/15, LBM 4/17 ?GI Imaging: ?4/3 CT abd: Decompressed R lower pelvic collections, increased complex R flank collection ?4/11 CT abd: diverticulitis, multiple fluid/air collections in pelvis with increased volume, 2 drains present; 3rd drain in R lobe liver  ?GI Surgeries / Procedures:   ?3/26 pelvic drain x2 ?4/4  drain placed in R lower quadrant abscess  ?4/12 small bowel resection, intraabd abscess drainage, diverting loop colostomy  ? ?Central access: double lumen PICC 4/13 ?TPN start date: 12/16/21 ? ?Nutritional Goals:  RD Estimated Needs ?Total Energy Estimated Needs: 1800-2000 ?Total Protein Estimated Needs: 90-100 ?Total Fluid Estimated Needs: >/= 2 L ? ?TPN Goal rate of 75 ml/hr provides 95g AA, 261g CHO and 56g ILE for a total of 1827 kCal ? ?Current  Nutrition:  ?FLD + TPN  ?4/16 ate a cup broth + a cup jello + 2 cups water at lunch and dinner, also 1 cup apple juice, and 1 ice pop with dinner.  ?4/17 ate broth, jello, pudding, juice, coffee and milk at all meals with no issues  ? ?Plan:  ?Halve TPN per team to rate 35 ml/hr at 1800 to provide45 g AA, 852 kCal, meeting ~50% of needs ?Electrolytes in TPN: Increase Na 125 mEq/L, Mag 15 mEq/L (maxed out), Phos 30 mmol/L (to match previous TPN), Cl:Ac 2:1; Continue K 55 mEq/L, removed Ca ?Remove multivitamin and trace elements from TPN; start PO multivitamin 4/19  ?Stop insulin  ?Monitor standard TPN labs daily until stable on TPN, then every Mon/Thurs ? ?Thank you for involving pharmacy in this patient's care. ? ?5/19, PharmD, BCPS, BCCP ?Clinical Pharmacist ? ?Please check AMION for all [facility] Pharmacy phone numbers ?After 10:00 PM, call Main Pharmacy [phone] ? ?

## 2021-12-21 NOTE — Progress Notes (Signed)
Mobility Specialist Progress Note: ? ? 12/21/21 1318  ?Mobility  ?Activity Ambulated with assistance in hallway  ?Level of Assistance Standby assist, set-up cues, supervision of patient - no hands on  ?Assistive Device None  ?Distance Ambulated (ft) 550 ft  ?Activity Response Tolerated well  ?$Mobility charge 1 Mobility  ? ?Pt received in chair willing to participate in mobility. Complaints of abdominal pain. Left in bed with call bell in reach and all needs met. ? ?Judith Miller ?Mobility Specialist ?Primary Phone [phone] ? ?

## 2021-12-21 NOTE — Progress Notes (Signed)
? ? ?6 Days Post-Op  ?Subjective: ?CC: ?Pain mainly in the lower abdomen, greatest in the RLQ around her drain. Overall stable to slightly improved from yesterday. Still requiring IV pain medication. Tolerating fld and finishing all her trays. No n/v. Having small amount of ostomy output.  ?Observed measuring cutting of the pouch and had a hands-on with application of the pouch + was able to roll pouch closed with WOCN yesterday.  ?Worked with therapies yesterday mobilized to 240 feet, min guard without any assistive devices.  Recommended for [facility] PT/OT. ? ?Objective: ?Vital signs in last 24 hours: ?Temp:  [97.5 ?F (36.4 ?C)-98.7 ?F (37.1 ?C)] 98.6 ?F (37 ?C) (04/18 0753) ?Pulse Rate:  [75-80] 79 (04/18 0753) ?Resp:  [18-20] 18 (04/18 0753) ?BP: (104-127)/(52-61) 117/59 (04/18 0753) ?SpO2:  [92 %-100 %] 94 % (04/18 0753) ?Last BM Date : 12/20/21 ? ?Intake/Output from previous day: ?04/17 0701 - 04/18 0700 ?In: 2397.7 [P.O.:970; I.V.:1296.4; IV Piggyback:131.3] ?Out: 1340 [Urine:1150; Drains:160; Stool:30] ?Intake/Output this shift: ?No intake/output data recorded. ? ?PE ?Gen:  Alert, NAD, pleasant ?Card:  Reg ?Pulm:  CTA b/l, normal rate and effort  ?Abd: Soft, ND, tenderness appropriately around midline wound, drain and ostomy.  Most of her tenderness is around the RLQ.  No peritonitis. +BS.  Her RLQ surgical drain is feculent.  IR TG drain is purulent/feculent.  Stoma appears viable.  Colostomy bag with small amount of liquid stool in bag. Vac to midline with cloudy output in cannister.  ?Ext:  No LE edema  ?Psych: A&Ox3  ?Skin: no rashes noted, warm and dry ? ?Lab Results:  ?Recent Labs  ?  12/20/21 ?0341 12/21/21 ?0502  ?WBC 16.5* 12.3*  ?HGB 7.6* 7.3*  ?HCT 24.0* 23.7*  ?PLT 574* 516*  ? ?BMET ?Recent Labs  ?  12/20/21 ?0341 12/21/21 ?0502  ?NA 133* 132*  ?K 4.3 4.5  ?CL 100 97*  ?CO2 27 28  ?GLUCOSE 120* 115*  ?BUN 13 14  ?CREATININE 0.37* <0.30*  ?CALCIUM 8.2* 8.2*  ? ?PT/INR ?No results for input(s): LABPROT,  INR in the last 72 hours. ?CMP  ?   ?Component Value Date/Time  ? NA 132 (L) 12/21/2021 0502  ? K 4.5 12/21/2021 0502  ? CL 97 (L) 12/21/2021 0502  ? CO2 28 12/21/2021 0502  ? GLUCOSE 115 (H) 12/21/2021 0502  ? BUN 14 12/21/2021 0502  ? CREATININE <0.30 (L) 12/21/2021 0502  ? CALCIUM 8.2 (L) 12/21/2021 0502  ? PROT 5.7 (L) 12/20/2021 0341  ? ALBUMIN 1.6 (L) 12/20/2021 0341  ? AST 21 12/20/2021 0341  ? ALT 20 12/20/2021 0341  ? ALKPHOS 65 12/20/2021 0341  ? BILITOT 0.4 12/20/2021 0341  ? GFRNONAA NOT CALCULATED 12/21/2021 0502  ? GFRAA  02/27/2010 2026  ?  >60        ?The eGFR has been calculated ?using the MDRD equation. ?This calculation has not been ?validated in all clinical ?situations. ?eGFR's persistently ?<60 mL/min signify ?possible Chronic Kidney Disease.  ? ?Lipase  ?No results found for: LIPASE ? ?Studies/Results: ?No results found. ? ?Anti-infectives: ?Anti-infectives (From admission, onward)  ? ? Start     Dose/Rate Route Frequency Ordered Stop  ? 12/15/21 2200  piperacillin-tazobactam (ZOSYN) IVPB 3.375 g       ? 3.375 g ?12.5 mL/hr over 240 Minutes Intravenous Every 8 hours 12/15/21 1457 12/20/21 1834  ? 12/08/21 1500  piperacillin-tazobactam (ZOSYN) IVPB 3.375 g  Status:  Discontinued       ? 3.375 g ?  12.5 mL/hr over 240 Minutes Intravenous Every 8 hours 12/08/21 1414 12/15/21 1457  ? 11/27/21 2300  piperacillin-tazobactam (ZOSYN) IVPB 3.375 g       ? 3.375 g ?12.5 mL/hr over 240 Minutes Intravenous Every 8 hours 11/27/21 2202 12/04/21 2025  ? ?  ? ? ? ?Assessment/Plan ?POD #6 S/p exploratory laparotomy, small bowel resection, drainage of intraabdominal abscesses, and diverting loop colostomy on 12/15/21 by Dr. Zenia Resides for Sigmoid diverticulitis with pelvic abscesses, multiple small bowel interloop abscess, failed nonoperative management with percutaneous drains.  ?- Continue Zosyn for now. Cx w/ Streptococcus constellatus and Bacteroides fragilis that is PCN sensitive. Repeat CT scan today to see if  any fluid collections remain.  It appears her drain characteristics changed overnight and are both now feculent. ?- Continue surgical JP drain and IR transgluteal drain. Flush IR drain ?- Cont FLD. 1/2 TPN.  ?- Encouraged mobilization, PT/OT. Rec HH.  ?- Wound vac to midline. M/W/F. WOCN following.  ?- WOC following for ostomy teaching and care. Appears red rubber was removed 4/17 per notes.  ?- Multimodal pain control ?- TOC consult. Will need HH PT/OT/RN and home vac.  ?  ?FEN: 1/2 TPN. Full liquid diet ?ID: Zosyn 3/25>>  ?VTE: SCD's, lovenox ?Foley: out 4/13 and voiding with purewick ?  ?ABL anemia - Hgb stable ?Small bilateral pleural effusions with atelectasis ?Bipolar disorder, home meds.  ?Tobacco abuse ? ? LOS: 24 days  ? ? ?Jillyn Ledger , PA-C ?[facility] ?12/21/2021, 11:09 AM ?Please see Amion for pager number during day hours 7:00am-4:30pm ? ?

## 2021-12-22 LAB — BASIC METABOLIC PANEL
Anion gap: 7 (ref 5–15)
BUN: 11 mg/dL (ref 6–20)
CO2: 27 mmol/L (ref 22–32)
Calcium: 8.5 mg/dL — ABNORMAL LOW (ref 8.9–10.3)
Chloride: 101 mmol/L (ref 98–111)
Creatinine, Ser: 0.37 mg/dL — ABNORMAL LOW (ref 0.44–1.00)
GFR, Estimated: >60 mL/min (ref >60)
Glucose, Bld: 99 mg/dL (ref 70–99)
Potassium: 4.3 mmol/L (ref 3.5–5.1)
Sodium: 135 mmol/L (ref 135–145)

## 2021-12-22 LAB — CBC
HCT: 23.7 % — ABNORMAL LOW (ref 36.0–46.0)
Hemoglobin: 7.7 g/dL — ABNORMAL LOW (ref 12.0–15.0)
MCH: 27.8 pg (ref 26.0–34.0)
MCHC: 32.5 g/dL (ref 30.0–36.0)
MCV: 85.6 fL (ref 80.0–100.0)
Platelets: 540 10*3/uL — ABNORMAL HIGH (ref 150–400)
RBC: 2.77 MIL/uL — ABNORMAL LOW (ref 3.87–5.11)
RDW: 15.8 % — ABNORMAL HIGH (ref 11.5–15.5)
WBC: 16.4 10*3/uL — ABNORMAL HIGH (ref 4.0–10.5)
nRBC: 0 % (ref 0.0–0.2)

## 2021-12-22 MED ORDER — PANTOPRAZOLE SODIUM 40 MG PO TBEC
40.0000 mg | DELAYED_RELEASE_TABLET | Freq: Two times a day (BID) | ORAL | Status: DC
  Administered 2021-12-22 – 2021-12-28 (×12): 40 mg via ORAL
  Filled 2021-12-22 (×12): qty 1

## 2021-12-22 NOTE — TOC Progression Note (Addendum)
Transition of Care (TOC) - Progression Note  ? ? ?Patient Details  ?Name: Judith Miller ?MRN: [id number] ?Date of Birth: 05/25/1961 ? ?Transition of Care (TOC) CM/SW Contact  ?Marilu Favre, RN ?Phone Number: ?12/22/2021, 10:03 AM ? ?Clinical Narrative:    ? ?So far unable to secure home health agency .  ? ?NCM will ask TOC leadership if they will approve a letter of guarantee . Cory with [facility] reviewing chart. Await determination from [facility] [place]  ? ?TOC Leadership will approve LOG for [facility] visits three a week for two weeks.  ? ?Await determination from [facility]. ? ?CCS aware and will sign home VAC application  ? ?IF [facility] accepts LOG and if home VAC approved . Home VAC and supplies will be delivered to hospital room prior to discharge . ? ?Will confirm with WOC that they plan to enroll patient in indigent program for ostomy supplies . Confirmed with WOC they have enrolled patient in program for ostomy supplies and provide ostomy clinic information.  ? ?Bedside nurse will provide some ostomy supplies and drain  supplies  ? ?Spoke to Floris at [facility] , they can accept LOG for [facility] visits 3 visits a week for $150.00 a visit for 2 weeks. Start of care December 27, 2021 . LOG needs to be emailed to [email or web address] . ? ?Submitted LOG to leadership  ? ?1555 Forms for charity [facility] faxed to TRacy with [employer]. Patient aware [facility] will pay for a nurse through [facility] 3 visits a week for 2 weeks. Patient asking for more visits so [facility] can assist with ostomy care. NCM explained patient will need to be able to care for ostomy and drain prior to discharge. HHRN will make visits Monday Wednesday Friday for two weeks for VAC changes. PAtient needs to be able to empty and change ostomy bag and care for drain. NCM  explained HHRN will not be there every time ostomy needs to be emptied or changed , also when drain needs to be emptied , measured and recorded   ? ?Expected Discharge  Plan: [place] ?Barriers to Discharge: Continued Medical Work up ? ?Expected Discharge Plan and Services ?Expected Discharge Plan: [place] ?  ?Discharge Planning Services: CM Consult ?Post Acute Care Choice: Home Health ?Living arrangements for the past 2 months: [place] ?                ?DME Arranged: Walker rolling ?DME Agency: [employer] ?  ?  ?  ?HH Arranged: PT ?[place] Agency: [facility] ?Date HH Agency Contacted: 12/02/21 ?Time [place]: 1310 ?Representative spoke with at [place]: Amy ? ? ?Social Determinants of Health (SDOH) Interventions ?  ? ?Readmission Risk Interventions ?   ? View : No data to display.  ?  ?  ?  ? ? ?

## 2021-12-22 NOTE — Progress Notes (Signed)
Physical Therapy Treatment ?Patient Details ?Name: Judith Miller ?MRN: [id number] ?DOB: 04/30/1961 ?Today's Date: 12/22/2021 ? ? ?History of Present Illness 61 y.o. female presents to [facility] hospital 11/27/2021 as a transfer from [facility] with perforated sigmoid colon. Pt underwent CT guided pelvic drain placement on 3/26. 12/15/21 underwent ex lap with SBRand colostomy with placement of wound vac due to large abscess and persistent pelvic fluid collection.  PMH includes bipolar disorder. ? ?  ?PT Comments  ? ? Pt received supine and agreeable to session with encouragement. Pt able to ambulate without AD with slow and guarded gait and continues to be limited by pain secondary to wound care. Educated pt re; benefits of continued mobility and encouraged pt to be up with MT in afternoon to pt tolerance, pt verbalized understanding. Pt continues to benefit from skilled PT services to progress toward functional mobility goals.   ?  ?Recommendations for follow up therapy are one component of a multi-disciplinary discharge planning process, led by the attending physician.  Recommendations may be updated based on patient status, additional functional criteria and insurance authorization. ? ?Follow Up Recommendations ? Home health PT ?  ?  ?Assistance Recommended at Discharge Intermittent Supervision/Assistance  ?Patient can return home with the following [name];[name];Assistance with cooking/housework;Assist for transportation;[name] ?  ?Equipment Recommendations ? Rolling walker (2 wheels)  ?  ?Recommendations for Other Services   ? ? ?  ?Precautions / Restrictions Precautions ?Precautions: Fall ?Precaution Comments: JP drain x 2, new colostomy, wound vac, abdominal precautions for comfort ?Restrictions ?Weight Bearing Restrictions: No  ?  ? ?Mobility ? Bed Mobility ?Overal bed mobility: Needs Assistance ?Bed  Mobility: Supine to Sit, Sit to Supine ?  ?Sidelying to sit: Min assist ?Supine to sit: Min assist ?  ?  ?General bed mobility comments: pulled up on therapist's hand, resistant to education on log roll technique, assist to being LE back onto and in bed ?  ? ?Transfers ?Overall transfer level: Needs assistance ?Equipment used: None ?Transfers: Sit to/from Stand ?Sit to Stand: Min guard ?  ?  ?  ?  ?  ?General transfer comment: slow to rise ?  ? ?Ambulation/Gait ?Ambulation/Gait assistance: Min guard ?Gait Distance (Feet): 250 Feet ?Assistive device: None ?Gait Pattern/deviations: Step-through pattern, Decreased stride length ?  ?  ?  ?General Gait Details: slow and guarded gait but progressing well. Did not need assist and no LOB with min challenges. ? ? ?Stairs ?  ?  ?  ?  ?  ? ? ?Wheelchair Mobility ?  ? ?Modified Rankin (Stroke Patients Only) ?  ? ? ?  ?Balance Overall balance assessment: Needs assistance ?Sitting-balance support: Feet supported ?Sitting balance-Leahy Scale: Good ?Sitting balance - Comments: no LOB with donning socks ?  ?Standing balance support: No upper extremity supported, During functional activity ?Standing balance-Leahy Scale: Fair ?  ?  ?  ?  ?  ?  ?  ?  ?  ?  ?  ?  ?  ? ?  ?Cognition Arousal/Alertness: Awake/alert ?Behavior During Therapy: [facility] for tasks assessed/performed ?Overall Cognitive Status: Within Functional Limits for tasks assessed ?  ?  ?  ?  ?  ?  ?  ?  ?  ?  ?  ?  ?  ?  ?  ?  ?General Comments: continues to be concerned about whether she will have enough help at home and if she can  manage her colostomy care ?  ?  ? ?  ?Exercises   ? ?  ?General Comments   ?  ?  ? ?Pertinent Vitals/Pain Pain Assessment ?Pain Assessment: Faces ?Faces Pain Scale: Hurts whole lot ?Pain Location: abdomen ?Pain Descriptors / Indicators: Guarding, Grimacing ?Pain Intervention(s): Limited activity within patient's tolerance, Monitored during session  ? ? ?Home Living   ?  ?  ?  ?  ?  ?  ?  ?  ?  ?    ?  ?Prior Function    ?  ?  ?   ? ?PT Goals (current goals can now be found in the care plan section) Acute Rehab PT Goals ?PT Goal Formulation: With patient ?Time For Goal Achievement: 01/03/22 ? ?  ?Frequency ? ? ? Min 3X/week ? ? ? ?  ?PT Plan Current plan remains appropriate  ? ? ?Co-evaluation   ?  ?  ?  ?  ? ?  ?AM-PAC PT "6 Clicks" Mobility   ?Outcome Measure ? Help needed turning from your back to your side while in a flat bed without using bedrails?: A Lot ?Help needed moving from lying on your back to sitting on the side of a flat bed without using bedrails?: A Lot ?Help needed moving to and from a bed to a chair (including a wheelchair)?: A Little ?Help needed standing up from a chair using your arms (e.g., wheelchair or bedside chair)?: A Little ?Help needed to walk in hospital room?: A Little ?Help needed climbing 3-5 steps with a railing? : A Little ?6 Click Score: 16 ? ?  ?End of Session   ?Activity Tolerance: Patient limited by pain ?Patient left: with call bell/phone within reach;in bed ?Nurse Communication: Mobility status ?PT Visit Diagnosis: Other abnormalities of gait and mobility (R26.89);Muscle weakness (generalized) (M62.81);Pain ?  ? ? ?Time: 3329-5188 ?PT Time Calculation (min) (ACUTE ONLY): 23 min ? ?Charges:  $Therapeutic Exercise: 23-37 mins          ?          ? ?Lenora Boys. PTA ?Acute Rehabilitation Services ?Office: [phone] ? ? ? ?Judith Miller ?12/22/2021, 2:04 PM ? ?

## 2021-12-22 NOTE — Progress Notes (Signed)
? ? ?Referring Physician(s): ?Sophronia SimasShelby Allen ? ?Supervising Physician: Oley BalmHassell, Daniel ? ?[facility] ? ?Chief Complaint: ? ?Abdominal abscesses - drain follow up ? ?Brief History: ? ?Judith Miller is 61 year old female with Sigmoid diverticulitis with pelvic abscesses, multiple small bowel interloop abscess, ? ?She underwent placement of a left transgluteal and RLQ drain on 11/28/21 by Dr. Bryn GullingMir then an additional drain on 12/07/21 by Dr. Milford CageMugweru. ? ?She failed nonoperative management with percutaneous drains.  ? ?She is POD #7 from  exploratory laparotomy, small bowel resection, drainage of intraabdominal abscesses, and diverting loop colostomy on 12/15/21. ? ?CT scan done yesterday showed = ?-Percutaneous drainage catheter adjacent to the inferior to the ?liver has been removed and there is new small enhancing fluid ?collection in this region worrisome for abscess. ?-Enlarging right pelvic air-fluid collection with new percutaneous ?drainage catheter in place. ?-Other pelvic and abdominal fluid collections have slightly ?decreased in size. In particular, central mesenteric fluid ?collection is no longer seen. ? ?Subjective: ? ?Doing ok, although expresses frustration with how much pain the wound vac changes cause her. ? ?Allergies: ?Sulfa antibiotics ? ?Medications: ?Prior to Admission medications   ?Medication Sig Start Date End Date Taking? Authorizing Provider  ?risperiDONE (RISPERDAL) 0.5 MG tablet Take 0.5 mg by mouth in the morning.   Yes [provider]  ?risperiDONE (RISPERDAL) 1 MG tablet Take 1.5 mg by mouth at bedtime.   Yes [provider]  ?vitamin B-12 (CYANOCOBALAMIN) 500 MCG tablet Take 500 mcg by mouth daily.   Yes [provider]  ? ? ? ?Vital Signs: ?BP 123/62 (BP Location: Left Arm)   Pulse 80   Temp 98.3 ?F (36.8 ?C) (Oral)   Resp 16   Ht 5\' 4"  (1.626 m)   Wt 182 lb (82.6 kg)   SpO2 94%   BMI 31.24 kg/m?  ? ?Physical Exam ?Vitals reviewed.  ?Constitutional:    ?   Appearance: Normal appearance.  ?Cardiovascular:  ?   Rate and Rhythm: Normal rate.  ?Pulmonary:  ?   Effort: Pulmonary effort is normal. No respiratory distress.  ?Neurological:  ?   General: No focal deficit present.  ?   Mental Status: She is alert and oriented to person, place, and time.  ?Psychiatric:     ?   Mood and Affect: Mood normal.     ?   Behavior: Behavior normal.     ?   Thought Content: Thought content normal.     ?   Judgment: Judgment normal.  ?Drain Location: Transgluteal draining LLQ abscess ?Size: Fr size: 10 Fr ?Date of placement: 11/28/21  ?Currently to: Drain collection device: suction bulb ?24 hour output:  ?Output by Drain (mL) 12/20/21 0701 - 12/20/21 1900 12/20/21 1901 - 12/21/21 0700 12/21/21 0701 - 12/21/21 1900 12/21/21 1901 - 12/22/21 0700 12/22/21 0701 - 12/22/21 1444  ?Closed System Drain 2 Left Buttock Bulb (JP) 10 Fr. 70 50 12 15   ?Closed System Drain 1 Right Abdomen Bulb (JP) 19 Fr. 40  10 20   ?Negative Pressure Wound Therapy Abdomen Medial    200   ? ? ?Current examination: ?Flushes/aspirates easily.  ?Insertion site unremarkable. ?Suture and stat lock in place. ?Dressed appropriately.  ? ?Imaging: ?CT ABDOMEN PELVIS W CONTRAST ? ?Result Date: 12/21/2021 ?CLINICAL DATA:  Postoperative abdominal pain. Small-bowel resection and intra-abdominal abscess. Diverting loop colostomy. EXAM: CT ABDOMEN AND PELVIS WITH CONTRAST TECHNIQUE: Multidetector CT imaging of the abdomen and pelvis was performed using  the standard protocol following bolus administration of intravenous contrast. RADIATION DOSE REDUCTION: This exam was performed according to the departmental dose-optimization program which includes automated exposure control, adjustment of the mA and/or kV according to patient size and/or use of iterative reconstruction technique. CONTRAST:  OMNIPAQUE IOHEXOL 300 MG/ML  SOLN COMPARISON:  CT abdomen and pelvis 12/14/2021. FINDINGS: Lower chest: Atelectasis in the bilateral  lower lobes as well as airspace disease in the right lower lobe, unchanged from prior. Hepatobiliary: No focal liver abnormality is seen. No gallstones, gallbladder wall thickening, or biliary dilatation. Pancreas: Unremarkable. No pancreatic ductal dilatation or surrounding inflammatory changes. Spleen: Normal in size without focal abnormality. Adrenals/Urinary Tract: Left adrenal thickening is unchanged. Right adrenal gland within normal limits. There is mild left and mild-to-moderate right hydroureteronephrosis to the level of the pelvis similar to the prior study. No obstructing calculi are seen. There is a small amount of air in the bladder. The bladder is otherwise within normal limits. Stomach/Bowel: Oral contrast reaches the rectum. There is no extravasation of oral contrast. There is no evidence for bowel obstruction. There is a new diverting transverse colostomy. Stomach is nondilated. There is colonic diverticulosis without evidence for diverticulitis. The appendix is within limits. There is some wall thickening of small bowel loops in the pelvis. Vascular/Lymphatic: Aortic atherosclerosis. No enlarged abdominal or pelvic lymph nodes. Reproductive: Uterus is not definitely seen. Other: There is a new right-sided abdominal approach pelvic catheter with distal tip in the central lower pelvis. This catheter tip is in pelvic air-fluid collection right of midline measuring 5.9 x 4.8 cm. This collection has increased in size. Previous identified midline anterior lower abdominal catheter has been removed. Posterior approach percutaneous drainage catheter is again seen with distal tip and pelvic air-fluid collection. This collection measures 4.6 x 5.5 cm and has slightly decreased in size. Previously identified central mesenteric enhancing fluid collection containing air is no longer seen. There is some residual mesenteric edema and air in this level which appears un organized. There is a new small amount of free  air in the anterior abdomen likely related to recent abdominal surgery. There is also a new ill-defined fluid collection abutting the lower anterior abdominal wall measuring 1.7 x 5.1 by 2.0 cm. There is no air or wall enhancement in this region. Previously identified percutaneous drainage catheter adjacent to the inferior tip of the liver has been removed. There is reaccumulation of enhancing fluid collection at this level adjacent to the tip of the liver measuring 2.1 x 4.8 by 2.0 cm. Enhancing fluid collection adjacent to the posterior aspect of the liver has slightly decreased in size now measuring 1.7 x 1.9 cm. There is diffuse body wall edema as seen on the prior study. Musculoskeletal: Degenerative changes at L5-S1 IMPRESSION: 1. New postsurgical changes of diverting transverse colostomy. There is no bowel obstruction. 2. There is wall thickening of small bowel loops in the lower anterior abdomen which may represent enteritis. Sigmoid colon diverticulosis is unchanged. 3. Percutaneous drainage catheter adjacent to the inferior to the liver has been removed and there is new small enhancing fluid collection in this region worrisome for abscess. 4. Enlarging right pelvic air-fluid collection with new percutaneous drainage catheter in place. 5. Other pelvic and abdominal fluid collections have slightly decreased in size. In particular, central mesenteric fluid collection is no longer seen. 6. Stable bilateral hydronephrosis likely secondary to pelvic fluid collections. Electronically Signed   By: Darliss Cheney M.D.   On: 12/21/2021 20:35   ? ?  Labs: ? ?CBC: ?Recent Labs  ?  12/19/21 ?0303 12/20/21 ?0341 12/21/21 ?0502 12/22/21 ?0349  ?WBC 15.2* 16.5* 12.3* 16.4*  ?HGB 7.8* 7.6* 7.3* 7.7*  ?HCT 24.6* 24.0* 23.7* 23.7*  ?PLT 601* 574* 516* 540*  ? ? ?COAGS: ?Recent Labs  ?  11/28/21 ?1054  ?INR 1.2  ? ? ?BMP: ?Recent Labs  ?  12/19/21 ?0303 12/20/21 ?0341 12/21/21 ?0502 12/22/21 ?0349  ?NA 134* 133* 132* 135  ?K 4.0  4.3 4.5 4.3  ?CL 98 100 97* 101  ?CO2 30 27 28 27   ?GLUCOSE 122* 120* 115* 99  ?BUN 8 13 14 11   ?CALCIUM 8.1* 8.2* 8.2* 8.5*  ?CREATININE 0.35* 0.37* <0.30* 0.37*  ?GFRNONAA >60 >60 NOT CALCULATED >60  ?

## 2021-12-22 NOTE — Progress Notes (Signed)
? ? ?7 Days Post-Op  ?Subjective: ?CC: ?Seen at time of VAC change. Having some pain with this but abdominal pain overall improved. She is still finishing all of her FLD trays without n/v/abd pain and having good output into ostomy. She complains of new left hip pain that feels like a numb/ache - no pain at rest but hurts with movement of getting up and out of bed. Is not limiting ambulation ? ?Objective: ?Vital signs in last 24 hours: ?Temp:  [98.2 ?F (36.8 ?C)-98.4 ?F (36.9 ?C)] 98.3 ?F (36.8 ?C) (04/19 6295) ?Pulse Rate:  [79-90] 80 (04/19 0826) ?Resp:  [16-18] 16 (04/19 0826) ?BP: (114-142)/(47-62) 123/62 (04/19 2841) ?SpO2:  [94 %-95 %] 94 % (04/19 0826) ?Last BM Date : 12/20/21 ? ?Intake/Output from previous day: ?04/18 0701 - 04/19 0700 ?In: 22.5 [I.V.:12.5] ?Out: 1057 [Urine:800; Drains:257] ?Intake/Output this shift: ?No intake/output data recorded. ? ?PE ?Gen:  Alert, NAD, pleasant ?Card:  Reg ?Pulm:  normal rate and effort  ?Abd: Soft, ND, tenderness appropriately around midline wound, drain and ostomy.  No peritonitis. +BS.  Her RLQ surgical drain is feculent.  IR TG drain is purulent/feculent.  Stoma not visible due to stool.  Colostomy bag with liquid stool in bag. Midline wound with good granulation tissue and no surrounding erythema/induration. Cloudy sanguinous fluid in cannister ?Ext:  No LE edema. No TTP over left hip  ?Psych: A&Ox3  ?Skin: no rashes noted, warm and dry ? ?Lab Results:  ?Recent Labs  ?  12/21/21 ?0502 12/22/21 ?0349  ?WBC 12.3* 16.4*  ?HGB 7.3* 7.7*  ?HCT 23.7* 23.7*  ?PLT 516* 540*  ? ? ?BMET ?Recent Labs  ?  12/21/21 ?0502 12/22/21 ?0349  ?NA 132* 135  ?K 4.5 4.3  ?CL 97* 101  ?CO2 28 27  ?GLUCOSE 115* 99  ?BUN 14 11  ?CREATININE <0.30* 0.37*  ?CALCIUM 8.2* 8.5*  ? ? ?PT/INR ?No results for input(s): LABPROT, INR in the last 72 hours. ?CMP  ?   ?Component Value Date/Time  ? NA 135 12/22/2021 0349  ? K 4.3 12/22/2021 0349  ? CL 101 12/22/2021 0349  ? CO2 27 12/22/2021 0349  ?  GLUCOSE 99 12/22/2021 0349  ? BUN 11 12/22/2021 0349  ? CREATININE 0.37 (L) 12/22/2021 0349  ? CALCIUM 8.5 (L) 12/22/2021 0349  ? PROT 5.7 (L) 12/20/2021 0341  ? ALBUMIN 1.6 (L) 12/20/2021 0341  ? AST 21 12/20/2021 0341  ? ALT 20 12/20/2021 0341  ? ALKPHOS 65 12/20/2021 0341  ? BILITOT 0.4 12/20/2021 0341  ? GFRNONAA >60 12/22/2021 0349  ? GFRAA  02/27/2010 2026  ?  >60        ?The eGFR has been calculated ?using the MDRD equation. ?This calculation has not been ?validated in all clinical ?situations. ?eGFR's persistently ?<60 mL/min signify ?possible Chronic Kidney Disease.  ? ?Lipase  ?No results found for: LIPASE ? ?Studies/Results: ?CT ABDOMEN PELVIS W CONTRAST ? ?Result Date: 12/21/2021 ?CLINICAL DATA:  Postoperative abdominal pain. Small-bowel resection and intra-abdominal abscess. Diverting loop colostomy. EXAM: CT ABDOMEN AND PELVIS WITH CONTRAST TECHNIQUE: Multidetector CT imaging of the abdomen and pelvis was performed using the standard protocol following bolus administration of intravenous contrast. RADIATION DOSE REDUCTION: This exam was performed according to the departmental dose-optimization program which includes automated exposure control, adjustment of the mA and/or kV according to patient size and/or use of iterative reconstruction technique. CONTRAST:  136mL OMNIPAQUE IOHEXOL 300 MG/ML  SOLN COMPARISON:  CT abdomen and pelvis 12/14/2021. FINDINGS: Lower  chest: Atelectasis in the bilateral lower lobes as well as airspace disease in the right lower lobe, unchanged from prior. Hepatobiliary: No focal liver abnormality is seen. No gallstones, gallbladder wall thickening, or biliary dilatation. Pancreas: Unremarkable. No pancreatic ductal dilatation or surrounding inflammatory changes. Spleen: Normal in size without focal abnormality. Adrenals/Urinary Tract: Left adrenal thickening is unchanged. Right adrenal gland within normal limits. There is mild left and mild-to-moderate right  hydroureteronephrosis to the level of the pelvis similar to the prior study. No obstructing calculi are seen. There is a small amount of air in the bladder. The bladder is otherwise within normal limits. Stomach/Bowel: Oral contrast reaches the rectum. There is no extravasation of oral contrast. There is no evidence for bowel obstruction. There is a new diverting transverse colostomy. Stomach is nondilated. There is colonic diverticulosis without evidence for diverticulitis. The appendix is within limits. There is some wall thickening of small bowel loops in the pelvis. Vascular/Lymphatic: Aortic atherosclerosis. No enlarged abdominal or pelvic lymph nodes. Reproductive: Uterus is not definitely seen. Other: There is a new right-sided abdominal approach pelvic catheter with distal tip in the central lower pelvis. This catheter tip is in pelvic air-fluid collection right of midline measuring 5.9 x 4.8 cm. This collection has increased in size. Previous identified midline anterior lower abdominal catheter has been removed. Posterior approach percutaneous drainage catheter is again seen with distal tip and pelvic air-fluid collection. This collection measures 4.6 x 5.5 cm and has slightly decreased in size. Previously identified central mesenteric enhancing fluid collection containing air is no longer seen. There is some residual mesenteric edema and air in this level which appears un organized. There is a new small amount of free air in the anterior abdomen likely related to recent abdominal surgery. There is also a new ill-defined fluid collection abutting the lower anterior abdominal wall measuring 1.7 x 5.1 by 2.0 cm. There is no air or wall enhancement in this region. Previously identified percutaneous drainage catheter adjacent to the inferior tip of the liver has been removed. There is reaccumulation of enhancing fluid collection at this level adjacent to the tip of the liver measuring 2.1 x 4.8 by 2.0 cm.  Enhancing fluid collection adjacent to the posterior aspect of the liver has slightly decreased in size now measuring 1.7 x 1.9 cm. There is diffuse body wall edema as seen on the prior study. Musculoskeletal: Degenerative changes at L5-S1 IMPRESSION: 1. New postsurgical changes of diverting transverse colostomy. There is no bowel obstruction. 2. There is wall thickening of small bowel loops in the lower anterior abdomen which may represent enteritis. Sigmoid colon diverticulosis is unchanged. 3. Percutaneous drainage catheter adjacent to the inferior to the liver has been removed and there is new small enhancing fluid collection in this region worrisome for abscess. 4. Enlarging right pelvic air-fluid collection with new percutaneous drainage catheter in place. 5. Other pelvic and abdominal fluid collections have slightly decreased in size. In particular, central mesenteric fluid collection is no longer seen. 6. Stable bilateral hydronephrosis likely secondary to pelvic fluid collections. Electronically Signed   By: Ronney Asters M.D.   On: 12/21/2021 20:35   ? ?Anti-infectives: ?Anti-infectives (From admission, onward)  ? ? Start     Dose/Rate Route Frequency Ordered Stop  ? 12/15/21 2200  piperacillin-tazobactam (ZOSYN) IVPB 3.375 g       ? 3.375 g ?12.5 mL/hr over 240 Minutes Intravenous Every 8 hours 12/15/21 1457 12/20/21 1834  ? 12/08/21 1500  piperacillin-tazobactam (ZOSYN) IVPB 3.375  g  Status:  Discontinued       ? 3.375 g ?12.5 mL/hr over 240 Minutes Intravenous Every 8 hours 12/08/21 1414 12/15/21 1457  ? 11/27/21 2300  piperacillin-tazobactam (ZOSYN) IVPB 3.375 g       ? 3.375 g ?12.5 mL/hr over 240 Minutes Intravenous Every 8 hours 11/27/21 2202 12/04/21 2025  ? ?  ? ? ? ?Assessment/Plan ?POD #7 S/p exploratory laparotomy, small bowel resection, drainage of intraabdominal abscesses, and diverting loop colostomy on 12/15/21 by Dr. Zenia Resides for Sigmoid diverticulitis with pelvic abscesses, multiple small  bowel interloop abscess, failed nonoperative management with percutaneous drains.  ?- Continue Zosyn for now. Cx w/ Streptococcus constellatus and Bacteroides fragilis that is PCN sensitive.  ?- Repeat CT 4/18 with small fluid co

## 2021-12-22 NOTE — Progress Notes (Signed)
PHARMACY - TOTAL PARENTERAL NUTRITION CONSULT NOTE ? ?Indication: Prolonged ileus ? ?Patient Measurements: ?Height: 5\' 4"  (162.6 cm) ?Weight: 82.6 kg (182 lb) ?IBW/kg (Calculated) : 54.7 ?TPN AdjBW (KG): 61.7 ?Body mass index is 31.24 kg/m?. ? ?Assessment:  ?Judith Miller presented on 3/25 with pelvic abscesses 2/2 sigmoid colon perforation, intra-abdominal fluid collections and ileus.  IR placed pelvic drains x2 on 3/26.  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  No clinical improvement and repeat CT on 4/11 shows large abscess and persistent pelvic fluid collection.  Underwent ex-lap with SBR and colostomy on 4/12.  Pharmacy consulted to manage TPN. ? ?Patient was NPO on admit, then advanced to CLD on 3/29, FLD on 3/31 and soft diet on 4/3 through 4/12. Per RD documentation, intake was 50-80% of patient's needs. Advancing to soft diet on 4/19. Per discussion with surgery team, ok to discontinue TPN on 4/19.  ? ?Glucose / Insulin: no hx DM - glucose < 180, no insulin given in the last 24hr   ?Electrolytes: K 4.3 up (goal >/= 4), Mag 1.9 (goal >/= 2), CoCa ~10.4 (removed from TPN 4/15), others WNL ?Renal: SCr 0.37, BUN WNL ?Hepatic: LFTs / tbili / TG WNL, albumin 1.6 ?Intake / Output; MIVF: UOP not consistently documented, drains 47ml, LBM 4/17 ?GI Imaging: ?4/3 CT abd: Decompressed R lower pelvic collections, increased complex R flank collection ?4/11 CT abd: diverticulitis, multiple fluid/air collections in pelvis with increased volume, 2 drains present; 3rd drain in R lobe liver  ?GI Surgeries / Procedures:   ?3/26 pelvic drain x2 ?4/4  drain placed in R lower quadrant abscess  ?4/12 small bowel resection, intraabd abscess drainage, diverting loop colostomy  ? ?Central access: double lumen PICC 4/13 ?TPN start date: 12/16/21 ? ?Nutritional Goals:  RD Estimated Needs ?Total Energy Estimated Needs: 1800-2000 ?Total Protein Estimated Needs: 90-100 ?Total Fluid Estimated Needs: >/=  2 L ? ?TPN Goal rate of 75 ml/hr provides 95g AA, 261g CHO and 56g ILE for a total of 1827 kCal ? ?Current Nutrition:  ?Soft diet + TPN  ? ?Plan:  ?Decrease TPN rate to 20 ml/hr at 1600, then, discontinue TPN.  ? ?Pharmacy will sign-off.  ?Thank you for involving pharmacy in this patient's care. ? ?Luisa Hart, PharmD, BCPS ?Clinical Pharmacist ?12/22/2021 11:50 AM  ? ?Please refer to [facility] for pharmacy phone number  ?

## 2021-12-22 NOTE — Consult Note (Addendum)
WOC Nurse wound follow up ?Wound type:midline abdominal surgical with VAC in place ?Measurement:20 cm x 6 cm x 3.8 cm  ?Wound TGG:YIRS and moist  bleeds with cleansing ?Drainage (amount, consistency, odor moderate serosanguinous feculent odor in room, but ostomy pouch changed this AM  ?Periwound: LMQ colostomy ?Dressing procedure/placement/frequency: 2 pieces black foam  covered with drape and barrier ring at distal end to preserve seal.  Seal immediately achieved. Change Mon/Wed/Fri.  ? Ostomy pouch had gotten over full and popped off early this AM and was re-applied this AM.  I am able to keep this pouch in place and change VAC.  Johnny Bridge, PA at bedside to observe wound.  Daughter is at bedside and is visibly nauseated at the smell and sight of wound and ostomy.  She is unable to learn any care for post discharge, she states due to weak stomach.  I remind patient and inform daughter that [facility] will require a teachable caregiver for ostomy care and that patient MUST be able to empty pouch.  She has not practiced with nurse tech when up to toilet as requested.  Encouraged her once more to do this.  Encouraged daughter to remind her.  Supplies ordered for [facility] and ostomy via [employer].  ? ?*Ostomy pouch is intact and functioning with soft brown stool in pouch.  Surgical PA indicates diet may be advancing today.  I remind patient to request assistance and empty pouch when 1/3 full.  ? ?Will follow.  ?Maple Hudson MSN, RN, FNP-BC CWON ?Wound, Ostomy, Continence Nurse ?Pager [phone]  ?  ?

## 2021-12-22 NOTE — Progress Notes (Signed)
Occupational Therapy Treatment ?Patient Details ?Name: Judith Miller ?MRN: [id number] ?DOB: 1961/07/02 ?Today's Date: 12/22/2021 ? ? ?History of present illness 61 y.o. female presents to [facility] 11/27/2021 as a transfer from [facility] with perforated sigmoid colon. Pt underwent CT guided pelvic drain placement on 3/26. 12/15/21 underwent ex lap with SBRand colostomy with placement of wound vac due to large abscess and persistent pelvic fluid collection.  PMH includes bipolar disorder. ?  ?OT comments ? Pt with increased abdominal pain following wound care. Requiring some encouragement to participate. Ambulated to bathroom for toileting and grooming with min guard assist. Issued and instructed in use of reacher. Educated pt that she will need help for IADLs at home due to abdominal precautions.   ? ?Recommendations for follow up therapy are one component of a multi-disciplinary discharge planning process, led by the attending physician.  Recommendations may be updated based on patient status, additional functional criteria and insurance authorization. ?   ?Follow Up Recommendations ? Home health OT  ?  ?Assistance Recommended at Discharge Frequent or constant Supervision/Assistance  ?Patient can return home with the following ? [name];Assist for transportation;[name];A lot of help with bathing/dressing/bathroom;Assistance with cooking/housework ?  ?Equipment Recommendations ? BSC/3in1  ?  ?Recommendations for Other Services   ? ?  ?Precautions / Restrictions Precautions ?Precautions: Fall ?Precaution Comments: JP drain x 2, new colostomy, wound vac, abdominal precautions for comfort ?Restrictions ?Weight Bearing Restrictions: No  ? ? ?  ? ?Mobility Bed Mobility ?Overal bed mobility: Needs Assistance ?Bed Mobility: Rolling, Sidelying to Sit ?Rolling: Min assist ?Sidelying to sit: Mod assist ?  ?  ?  ?  ?  ? ?Transfers ?Overall transfer level:  Needs assistance ?Equipment used: None ?Transfers: Sit to/from Stand ?Sit to Stand: Min guard ?  ?  ?  ?  ?  ?General transfer comment: slow to rise ?  ?  ?Balance Overall balance assessment: Needs assistance ?Sitting-balance support: Feet supported ?Sitting balance-Leahy Scale: Good ?  ?  ?Standing balance support: No upper extremity supported, During functional activity ?Standing balance-Leahy Scale: Fair ?  ?  ?  ?  ?  ?  ?  ?  ?  ?  ?  ?  ?   ? ?ADL either performed or assessed with clinical judgement  ? ?ADL Overall ADL's : Needs assistance/impaired ?  ?  ?Grooming: Oral care;Standing;Min guard ?  ?  ?  ?  ?  ?  ?  ?  ?  ?Toilet Transfer: Min guard;Ambulation;Comfort height toilet ?  ?Toileting- Architect and Hygiene: Min guard;Sit to/from stand ?  ?  ?  ?Functional mobility during ADLs: Min guard ?  ?  ? ?Extremity/Trunk Assessment   ?  ?  ?  ?  ?  ? ?Vision   ?  ?  ?Perception   ?  ?Praxis   ?  ? ?Cognition Arousal/Alertness: Awake/alert ?Behavior During Therapy: Anxious ?Overall Cognitive Status: Within Functional Limits for tasks assessed ?  ?  ?  ?  ?  ?  ?  ?  ?  ?  ?  ?  ?  ?  ?  ?  ?General Comments: continues to be concerned about whether she will have enough help at home and if she can manage her colostomy care ?  ?  ?   ?Exercises   ? ?  ?Shoulder Instructions   ? ? ?  ?General Comments    ? ? ?  Pertinent Vitals/ Pain       Pain Assessment ?Pain Assessment: Faces ?Faces Pain Scale: Hurts even more ?Pain Location: abdomen ?Pain Descriptors / Indicators: Guarding, Grimacing ?Pain Intervention(s): Monitored during session, Premedicated before session, Repositioned ? ?Home Living   ?  ?  ?  ?  ?  ?  ?  ?  ?  ?  ?  ?  ?  ?  ?  ?  ?  ?  ? ?  ?Prior Functioning/Environment    ?  ?  ?  ?   ? ?Frequency ? Min 2X/week  ? ? ? ? ?  ?Progress Toward Goals ? ?OT Goals(current goals can now be found in the care plan section) ? Progress towards OT goals: Progressing toward goals ? ?Acute Rehab OT  Goals ?OT Goal Formulation: With patient ?Time For Goal Achievement: 12/31/21 ?Potential to Achieve Goals: Good  ?Plan Discharge plan remains appropriate   ? ?Co-evaluation ? ? ?   ?  ?  ?  ?  ? ?  ?AM-PAC OT "6 Clicks" Daily Activity     ?Outcome Measure ? ? Help from another person eating meals?: None ?Help from another person taking care of personal grooming?: A Little ?Help from another person toileting, which includes using toliet, bedpan, or urinal?: A Little ?Help from another person bathing (including washing, rinsing, drying)?: A Little ?Help from another person to put on and taking off regular upper body clothing?: A Little ?Help from another person to put on and taking off regular lower body clothing?: A Little ?6 Click Score: 19 ? ?  ?End of Session   ? ?OT Visit Diagnosis: Unsteadiness on feet (R26.81);Other abnormalities of gait and mobility (R26.89);Pain;Muscle weakness (generalized) (M62.81) ?  ?Activity Tolerance Patient tolerated treatment well ?  ?Patient Left in bed;with call bell/phone within reach ?  ?Nurse Communication   ?  ? ?   ? ?Time: 3875-6433 ?OT Time Calculation (min): 19 min ? ?Charges: OT General Charges ?$OT Visit: 1 Visit ?OT Treatments ?$Self Care/Home Management : 8-22 mins ? ?Martie Round, OTR/L ?Acute Rehabilitation Services ?Pager: [phone] ?Office: [phone]  ?Judith Miller ?12/22/2021, 12:26 PM ?

## 2021-12-22 NOTE — Progress Notes (Signed)
Mobility Specialist Progress Note: ? ? 12/22/21 1359  ?Mobility  ?Activity Ambulated with assistance in hallway  ?Level of Assistance Standby assist, set-up cues, supervision of patient - no hands on  ?Assistive Device None  ?Distance Ambulated (ft) 550 ft  ?Activity Response Tolerated well  ?$Mobility charge 1 Mobility  ? ?Pt received in bed willing to participate in mobility. Complaints of 5/10 abdominal pain. Left in bed with call bell in reach and all needs met.  ? ?Judith Miller ?Mobility Specialist ?Primary Phone [phone] ? ?

## 2021-12-23 LAB — BASIC METABOLIC PANEL
Anion gap: 8 (ref 5–15)
BUN: 14 mg/dL (ref 6–20)
CO2: 26 mmol/L (ref 22–32)
Calcium: 8.7 mg/dL — ABNORMAL LOW (ref 8.9–10.3)
Chloride: 97 mmol/L — ABNORMAL LOW (ref 98–111)
Creatinine, Ser: 0.42 mg/dL — ABNORMAL LOW (ref 0.44–1.00)
GFR, Estimated: >60 mL/min (ref >60)
Glucose, Bld: 105 mg/dL — ABNORMAL HIGH (ref 70–99)
Potassium: 3.8 mmol/L (ref 3.5–5.1)
Sodium: 131 mmol/L — ABNORMAL LOW (ref 135–145)

## 2021-12-23 LAB — CBC
HCT: 23.9 % — ABNORMAL LOW (ref 36.0–46.0)
Hemoglobin: 7.6 g/dL — ABNORMAL LOW (ref 12.0–15.0)
MCH: 27.1 pg (ref 26.0–34.0)
MCHC: 31.8 g/dL (ref 30.0–36.0)
MCV: 85.4 fL (ref 80.0–100.0)
Platelets: 578 10*3/uL — ABNORMAL HIGH (ref 150–400)
RBC: 2.8 MIL/uL — ABNORMAL LOW (ref 3.87–5.11)
RDW: 15.8 % — ABNORMAL HIGH (ref 11.5–15.5)
WBC: 16.5 10*3/uL — ABNORMAL HIGH (ref 4.0–10.5)
nRBC: 0 % (ref 0.0–0.2)

## 2021-12-23 NOTE — Progress Notes (Signed)
? ? ?8 Days Post-Op  ?Subjective: ?CC: ?Tolerated soft diet yesterday with good appetite. No nausea/emesis or abdominal pain. Having stool output in colostomy - has not changed this yet herself. Working with PT/OT and ambulating. Still with some left hip pain but only with certain movements ? ?Objective: ?Vital signs in last 24 hours: ?Temp:  [97.3 ?F (36.3 ?C)-98.9 ?F (37.2 ?C)] 98.2 ?F (36.8 ?C) (04/20 3614) ?Pulse Rate:  [80-87] 80 (04/20 0639) ?Resp:  [15-17] 17 (04/20 4315) ?BP: (120-134)/(55-69) 130/69 (04/20 4008) ?SpO2:  [93 %-96 %] 94 % (04/20 0639) ?Last BM Date : 12/20/21 ? ?Intake/Output from previous day: ?04/19 0701 - 04/20 0700 ?In: 5 [I.V.:5] ?Out: 825 [Urine:825] ?Intake/Output this shift: ?No intake/output data recorded. ? ?PE ?Gen:  Alert, NAD, pleasant ?Card:  Reg ?Pulm:  normal rate and effort  ?Abd: Soft, ND, tenderness appropriately around midline wound, drain and ostomy.  No peritonitis. +BS.  Her RLQ surgical drain is feculent and leaking at site with output on gauze.  IR TG drain is scant purulent/feculent.  Stoma not visible due to stool.  Colostomy bag with liquid stool in bag. Midline wound with VAC in place with good suction. Cloudy sanguinous fluid in cannister ?Ext:  No LE edema. No TTP over left hip. No ecchymosis or deformity  ?Psych: A&Ox3  ?Skin: no rashes noted, warm and dry ? ?Lab Results:  ?Recent Labs  ?  12/22/21 ?6761 12/23/21 ?0405  ?WBC 16.4* 16.5*  ?HGB 7.7* 7.6*  ?HCT 23.7* 23.9*  ?PLT 540* 578*  ? ? ?BMET ?Recent Labs  ?  12/22/21 ?0349 12/23/21 ?0405  ?NA 135 131*  ?K 4.3 3.8  ?CL 101 97*  ?CO2 27 26  ?GLUCOSE 99 105*  ?BUN 11 14  ?CREATININE 0.37* 0.42*  ?CALCIUM 8.5* 8.7*  ? ? ?PT/INR ?No results for input(s): LABPROT, INR in the last 72 hours. ?CMP  ?   ?Component Value Date/Time  ? NA 131 (L) 12/23/2021 0405  ? K 3.8 12/23/2021 0405  ? CL 97 (L) 12/23/2021 0405  ? CO2 26 12/23/2021 0405  ? GLUCOSE 105 (H) 12/23/2021 0405  ? BUN 14 12/23/2021 0405  ? CREATININE  0.42 (L) 12/23/2021 0405  ? CALCIUM 8.7 (L) 12/23/2021 0405  ? PROT 5.7 (L) 12/20/2021 0341  ? ALBUMIN 1.6 (L) 12/20/2021 0341  ? AST 21 12/20/2021 0341  ? ALT 20 12/20/2021 0341  ? ALKPHOS 65 12/20/2021 0341  ? BILITOT 0.4 12/20/2021 0341  ? GFRNONAA >60 12/23/2021 0405  ? GFRAA  02/27/2010 2026  ?  >60        ?The eGFR has been calculated ?using the MDRD equation. ?This calculation has not been ?validated in all clinical ?situations. ?eGFR's persistently ?<60 mL/min signify ?possible Chronic Kidney Disease.  ? ?Lipase  ?No results found for: LIPASE ? ?Studies/Results: ?CT ABDOMEN PELVIS W CONTRAST ? ?Result Date: 12/21/2021 ?CLINICAL DATA:  Postoperative abdominal pain. Small-bowel resection and intra-abdominal abscess. Diverting loop colostomy. EXAM: CT ABDOMEN AND PELVIS WITH CONTRAST TECHNIQUE: Multidetector CT imaging of the abdomen and pelvis was performed using the standard protocol following bolus administration of intravenous contrast. RADIATION DOSE REDUCTION: This exam was performed according to the departmental dose-optimization program which includes automated exposure control, adjustment of the mA and/or kV according to patient size and/or use of iterative reconstruction technique. CONTRAST:  151mL OMNIPAQUE IOHEXOL 300 MG/ML  SOLN COMPARISON:  CT abdomen and pelvis 12/14/2021. FINDINGS: Lower chest: Atelectasis in the bilateral lower lobes as well as airspace disease  in the right lower lobe, unchanged from prior. Hepatobiliary: No focal liver abnormality is seen. No gallstones, gallbladder wall thickening, or biliary dilatation. Pancreas: Unremarkable. No pancreatic ductal dilatation or surrounding inflammatory changes. Spleen: Normal in size without focal abnormality. Adrenals/Urinary Tract: Left adrenal thickening is unchanged. Right adrenal gland within normal limits. There is mild left and mild-to-moderate right hydroureteronephrosis to the level of the pelvis similar to the prior study. No  obstructing calculi are seen. There is a small amount of air in the bladder. The bladder is otherwise within normal limits. Stomach/Bowel: Oral contrast reaches the rectum. There is no extravasation of oral contrast. There is no evidence for bowel obstruction. There is a new diverting transverse colostomy. Stomach is nondilated. There is colonic diverticulosis without evidence for diverticulitis. The appendix is within limits. There is some wall thickening of small bowel loops in the pelvis. Vascular/Lymphatic: Aortic atherosclerosis. No enlarged abdominal or pelvic lymph nodes. Reproductive: Uterus is not definitely seen. Other: There is a new right-sided abdominal approach pelvic catheter with distal tip in the central lower pelvis. This catheter tip is in pelvic air-fluid collection right of midline measuring 5.9 x 4.8 cm. This collection has increased in size. Previous identified midline anterior lower abdominal catheter has been removed. Posterior approach percutaneous drainage catheter is again seen with distal tip and pelvic air-fluid collection. This collection measures 4.6 x 5.5 cm and has slightly decreased in size. Previously identified central mesenteric enhancing fluid collection containing air is no longer seen. There is some residual mesenteric edema and air in this level which appears un organized. There is a new small amount of free air in the anterior abdomen likely related to recent abdominal surgery. There is also a new ill-defined fluid collection abutting the lower anterior abdominal wall measuring 1.7 x 5.1 by 2.0 cm. There is no air or wall enhancement in this region. Previously identified percutaneous drainage catheter adjacent to the inferior tip of the liver has been removed. There is reaccumulation of enhancing fluid collection at this level adjacent to the tip of the liver measuring 2.1 x 4.8 by 2.0 cm. Enhancing fluid collection adjacent to the posterior aspect of the liver has slightly  decreased in size now measuring 1.7 x 1.9 cm. There is diffuse body wall edema as seen on the prior study. Musculoskeletal: Degenerative changes at L5-S1 IMPRESSION: 1. New postsurgical changes of diverting transverse colostomy. There is no bowel obstruction. 2. There is wall thickening of small bowel loops in the lower anterior abdomen which may represent enteritis. Sigmoid colon diverticulosis is unchanged. 3. Percutaneous drainage catheter adjacent to the inferior to the liver has been removed and there is new small enhancing fluid collection in this region worrisome for abscess. 4. Enlarging right pelvic air-fluid collection with new percutaneous drainage catheter in place. 5. Other pelvic and abdominal fluid collections have slightly decreased in size. In particular, central mesenteric fluid collection is no longer seen. 6. Stable bilateral hydronephrosis likely secondary to pelvic fluid collections. Electronically Signed   By: Ronney Asters M.D.   On: 12/21/2021 20:35   ? ?Anti-infectives: ?Anti-infectives (From admission, onward)  ? ? Start     Dose/Rate Route Frequency Ordered Stop  ? 12/15/21 2200  piperacillin-tazobactam (ZOSYN) IVPB 3.375 g       ? 3.375 g ?12.5 mL/hr over 240 Minutes Intravenous Every 8 hours 12/15/21 1457 12/20/21 1834  ? 12/08/21 1500  piperacillin-tazobactam (ZOSYN) IVPB 3.375 g  Status:  Discontinued       ?  3.375 g ?12.5 mL/hr over 240 Minutes Intravenous Every 8 hours 12/08/21 1414 12/15/21 1457  ? 11/27/21 2300  piperacillin-tazobactam (ZOSYN) IVPB 3.375 g       ? 3.375 g ?12.5 mL/hr over 240 Minutes Intravenous Every 8 hours 11/27/21 2202 12/04/21 2025  ? ?  ? ? ? ?Assessment/Plan ?POD #8 S/p exploratory laparotomy, small bowel resection, drainage of intraabdominal abscesses, and diverting loop colostomy on 12/15/21 by Dr. Zenia Miller for Sigmoid diverticulitis with pelvic abscesses, multiple small bowel interloop abscess, failed nonoperative management with percutaneous drains.  ?-  Continue Zosyn for now. Cx w/ Streptococcus constellatus and Bacteroides fragilis that is PCN sensitive.  ?- Repeat CT 4/18 with small fluid collection inferior to liver, enlarging R pelvic fluid collection with

## 2021-12-23 NOTE — Progress Notes (Signed)
Physical Therapy Treatment ?Patient Details ?Name: Judith Miller ?MRN: [id number] ?DOB: 19-Nov-1960 ?Today's Date: 12/23/2021 ? ? ?History of Present Illness 61 y.o. female presents [facility] hospital 11/27/2021 as a transfer from [facility] with perforated sigmoid colon. Pt underwent CT guided pelvic drain placement on 3/26. 12/15/21 underwent ex lap with SBRand colostomy with placement of wound vac due to large abscess and persistent pelvic fluid collection.  PMH includes bipolar disorder. ? ?  ?PT Comments  ? ? Pt received supine and continues to require significant encouragement to mobilize. Pt needing min a to come to sitting on EOB to elevate trunk, coming directly from supine, pt resistant to log roll technique stating it creates increased abdominal pain. Pt min guard for transfers and ambulation with no LOB but instability noted throughout. Cues for self pacing and increasing activity tolerance with pt verbalizing understanding. Pt continues to benefit from skilled PT services to progress toward functional mobility goals.  ?  ?Recommendations for follow up therapy are one component of a multi-disciplinary discharge planning process, led by the attending physician.  Recommendations may be updated based on patient status, additional functional criteria and insurance authorization. ? ?Follow Up Recommendations ? Home health PT ?  ?  ?Assistance Recommended at Discharge Intermittent Supervision/Assistance  ?Patient can return home with the following [name];[name];Assistance with cooking/housework;Assist for transportation;[name] ?  ?Equipment Recommendations ? Rolling walker (2 wheels)  ?  ?Recommendations for Other Services   ? ? ?  ?Precautions / Restrictions Precautions ?Precautions: Fall ?Precaution Comments: JP drain x 2, new colostomy, wound vac, abdominal precautions for comfort ?Restrictions ?Weight  Bearing Restrictions: No  ?  ? ?Mobility ? Bed Mobility ?Overal bed mobility: Needs Assistance ?Bed Mobility: Supine to Sit, Sit to Supine ?  ?Sidelying to sit: Min assist ?  ?  ?  ?General bed mobility comments: pulled up on therapist's hand, resistant to education on log roll technique ?  ? ?Transfers ?Overall transfer level: Needs assistance ?Equipment used: None ?Transfers: Sit to/from Stand ?Sit to Stand: Min guard ?  ?  ?  ?  ?  ?General transfer comment: slow to rise ?  ? ?Ambulation/Gait ?Ambulation/Gait assistance: Min guard ?Gait Distance (Feet): 400 Feet ?Assistive device: None ?Gait Pattern/deviations: Step-through pattern, Decreased stride length ?Gait velocity: reduced ?  ?  ?General Gait Details: slow and guarded gait but progressing well. Did not need assist and no LOB with min challenges. ? ? ?Stairs ?  ?  ?  ?  ?  ? ? ?Wheelchair Mobility ?  ? ?Modified Rankin (Stroke Patients Only) ?  ? ? ?  ?Balance Overall balance assessment: Needs assistance ?Sitting-balance support: Feet supported ?Sitting balance-Leahy Scale: Good ?Sitting balance - Comments: no LOB with donning socks ?  ?Standing balance support: No upper extremity supported, During functional activity ?Standing balance-Leahy Scale: Fair ?  ?  ?  ?  ?  ?  ?  ?  ?  ?  ?  ?  ?  ? ?  ?Cognition Arousal/Alertness: Awake/alert ?Behavior During Therapy: [facility] for tasks assessed/performed ?Overall Cognitive Status: Within Functional Limits for tasks assessed ?  ?  ?  ?  ?  ?  ?  ?  ?  ?  ?  ?  ?  ?  ?  ?  ?General Comments: continues to be concerned about whether she will have enough help at home and if she can  manage her colostomy care ?  ?  ? ?  ?Exercises   ? ?  ?General Comments   ?  ?  ? ?Pertinent Vitals/Pain Pain Assessment ?Pain Assessment: Faces ?Faces Pain Scale: Hurts whole lot ?Pain Location: abdomen, L hip ?Pain Descriptors / Indicators: Guarding, Grimacing ?Pain Intervention(s): Limited activity within patient's tolerance, Monitored  during session, Repositioned, Patient requesting pain meds-RN notified  ? ? ?Home Living   ?  ?  ?  ?  ?  ?  ?  ?  ?  ?   ?  ?Prior Function    ?  ?  ?   ? ?PT Goals (current goals can now be found in the care plan section) Acute Rehab PT Goals ?PT Goal Formulation: With patient ?Time For Goal Achievement: 01/03/22 ? ?  ?Frequency ? ? ? Min 3X/week ? ? ? ?  ?PT Plan Current plan remains appropriate  ? ? ?Co-evaluation   ?  ?  ?  ?  ? ?  ?AM-PAC PT "6 Clicks" Mobility   ?Outcome Measure ? Help needed turning from your back to your side while in a flat bed without using bedrails?: A Lot ?Help needed moving from lying on your back to sitting on the side of a flat bed without using bedrails?: A Lot ?Help needed moving to and from a bed to a chair (including a wheelchair)?: A Little ?Help needed standing up from a chair using your arms (e.g., wheelchair or bedside chair)?: A Little ?Help needed to walk in hospital room?: A Little ?Help needed climbing 3-5 steps with a railing? : A Little ?6 Click Score: 16 ? ?  ?End of Session   ?Activity Tolerance: Patient limited by pain ?Patient left: in chair;with call bell/phone within reach;with chair alarm set ?Nurse Communication: Mobility status;Patient requests pain meds ?PT Visit Diagnosis: Other abnormalities of gait and mobility (R26.89);Muscle weakness (generalized) (M62.81);Pain ?  ? ? ?Time: 1355-1411 ?PT Time Calculation (min) (ACUTE ONLY): 16 min ? ?Charges:  $Therapeutic Exercise: 8-22 mins          ?          ? ?Judith Miller. PTA ?Acute Rehabilitation Services ?Office: [phone] ? ? ? ?Judith Miller ?12/23/2021, 2:37 PM ? ?

## 2021-12-23 NOTE — Progress Notes (Signed)
Mobility Specialist Progress Note: ? ? 12/23/21 1131  ?Mobility  ?Activity Ambulated with assistance in hallway  ?Level of Assistance Independent after set-up  ?Assistive Device None  ?Distance Ambulated (ft) 550 ft  ?Activity Response Tolerated well  ?$Mobility charge 1 Mobility  ? ?Pt received EOB willing to participate in mobility. Complaints of 7/10 abdominal pain. Left in chair with call bell in reach and all needs met.  ? ?Judith Miller ?Mobility Specialist ?Primary Phone [phone] ? ?

## 2021-12-23 NOTE — Plan of Care (Signed)
  Problem: Nutrition: Goal: Adequate nutrition will be maintained Outcome: Progressing   Problem: Pain Managment: Goal: General experience of comfort will improve Outcome: Progressing   Problem: Safety: Goal: Ability to remain free from injury will improve Outcome: Progressing   

## 2021-12-24 LAB — CBC
HCT: 24.3 % — ABNORMAL LOW (ref 36.0–46.0)
Hemoglobin: 7.7 g/dL — ABNORMAL LOW (ref 12.0–15.0)
MCH: 26.9 pg (ref 26.0–34.0)
MCHC: 31.7 g/dL (ref 30.0–36.0)
MCV: 85 fL (ref 80.0–100.0)
Platelets: 582 10*3/uL — ABNORMAL HIGH (ref 150–400)
RBC: 2.86 MIL/uL — ABNORMAL LOW (ref 3.87–5.11)
RDW: 15.9 % — ABNORMAL HIGH (ref 11.5–15.5)
WBC: 14.5 10*3/uL — ABNORMAL HIGH (ref 4.0–10.5)
nRBC: 0 % (ref 0.0–0.2)

## 2021-12-24 LAB — BASIC METABOLIC PANEL
Anion gap: 6 (ref 5–15)
BUN: 18 mg/dL (ref 6–20)
CO2: 28 mmol/L (ref 22–32)
Calcium: 9.1 mg/dL (ref 8.9–10.3)
Chloride: 99 mmol/L (ref 98–111)
Creatinine, Ser: 0.41 mg/dL — ABNORMAL LOW (ref 0.44–1.00)
GFR, Estimated: >60 mL/min (ref >60)
Glucose, Bld: 97 mg/dL (ref 70–99)
Potassium: 3.6 mmol/L (ref 3.5–5.1)
Sodium: 133 mmol/L — ABNORMAL LOW (ref 135–145)

## 2021-12-24 MED ORDER — PIPERACILLIN-TAZOBACTAM 3.375 G IVPB
3.3750 g | Freq: Three times a day (TID) | INTRAVENOUS | Status: DC
  Administered 2021-12-24 – 2021-12-28 (×12): 3.375 g via INTRAVENOUS
  Filled 2021-12-24 (×12): qty 50

## 2021-12-24 MED ORDER — ALUM & MAG HYDROXIDE-SIMETH 200-200-20 MG/5ML PO SUSP
15.0000 mL | Freq: Four times a day (QID) | ORAL | Status: DC | PRN
  Administered 2021-12-24 – 2021-12-25 (×2): 15 mL via ORAL
  Filled 2021-12-24 (×2): qty 30

## 2021-12-24 NOTE — Progress Notes (Signed)
? ? ?9 Days Post-Op  ?Subjective: ?CC: ?Pain very well controlled. Having some pain and anxiety around vac changes. Having some hesitance in regard to colostomy care. Tolerating soft diet without nausea, emesis, abdominal pain. Getting OOB ? ?Objective: ?Vital signs in last 24 hours: ?Temp:  [97.8 ?F (36.6 ?C)-99 ?F (37.2 ?C)] 98.4 ?F (36.9 ?C) (04/21 0725) ?Pulse Rate:  [80-88] 80 (04/21 0725) ?Resp:  [16-20] 16 (04/21 0725) ?BP: (120-134)/(52-62) 125/62 (04/21 0725) ?SpO2:  [91 %-96 %] 95 % (04/21 0725) ?Last BM Date : 12/23/21 ? ?Intake/Output from previous day: ?04/20 0701 - 04/21 0700 ?In: -  ?Out: 68 [Drains:35; Stool:20] ?Intake/Output this shift: ?No intake/output data recorded. ? ?PE ?Gen:  Alert, NAD, pleasant ?Card:  Reg ?Pulm:  normal rate and effort  ?Abd: Soft, ND, tenderness appropriately around midline wound, drains and ostomy.  No peritonitis. +BS.  Her RLQ surgical drain is feculent and leaking at site purulent/feculent drainage around tube - there is some erythema and very mild induration around drain site without palpable fluctuance.  IR TG drain is scant purulent/feculent.  Stoma pink and viable.  Colostomy bag with soft stool in bag. Midline wound as below ?Ext:  No LE edema. No TTP over left hip. No ecchymosis or deformity  ?Psych: A&Ox3  ?Skin: no rashes noted, warm and dry ? ? ? ? ?Lab Results:  ?Recent Labs  ?  12/23/21 ?0405 12/24/21 ?0425  ?WBC 16.5* 14.5*  ?HGB 7.6* 7.7*  ?HCT 23.9* 24.3*  ?PLT 578* 582*  ? ? ?BMET ?Recent Labs  ?  12/23/21 ?0405 12/24/21 ?0425  ?NA 131* 133*  ?K 3.8 3.6  ?CL 97* 99  ?CO2 26 28  ?GLUCOSE 105* 97  ?BUN 14 18  ?CREATININE 0.42* 0.41*  ?CALCIUM 8.7* 9.1  ? ? ?PT/INR ?No results for input(s): LABPROT, INR in the last 72 hours. ?CMP  ?   ?Component Value Date/Time  ? NA 133 (L) 12/24/2021 0425  ? K 3.6 12/24/2021 0425  ? CL 99 12/24/2021 0425  ? CO2 28 12/24/2021 0425  ? GLUCOSE 97 12/24/2021 0425  ? BUN 18 12/24/2021 0425  ? CREATININE 0.41 (L) 12/24/2021  0425  ? CALCIUM 9.1 12/24/2021 0425  ? PROT 5.7 (L) 12/20/2021 0341  ? ALBUMIN 1.6 (L) 12/20/2021 0341  ? AST 21 12/20/2021 0341  ? ALT 20 12/20/2021 0341  ? ALKPHOS 65 12/20/2021 0341  ? BILITOT 0.4 12/20/2021 0341  ? GFRNONAA >60 12/24/2021 0425  ? GFRAA  02/27/2010 2026  ?  >60        ?The eGFR has been calculated ?using the MDRD equation. ?This calculation has not been ?validated in all clinical ?situations. ?eGFR's persistently ?<60 mL/min signify ?possible Chronic Kidney Disease.  ? ?Lipase  ?No results found for: LIPASE ? ?Studies/Results: ?No results found. ? ?Anti-infectives: ?Anti-infectives (From admission, onward)  ? ? Start     Dose/Rate Route Frequency Ordered Stop  ? 12/15/21 2200  piperacillin-tazobactam (ZOSYN) IVPB 3.375 g       ? 3.375 g ?12.5 mL/hr over 240 Minutes Intravenous Every 8 hours 12/15/21 1457 12/20/21 1834  ? 12/08/21 1500  piperacillin-tazobactam (ZOSYN) IVPB 3.375 g  Status:  Discontinued       ? 3.375 g ?12.5 mL/hr over 240 Minutes Intravenous Every 8 hours 12/08/21 1414 12/15/21 1457  ? 11/27/21 2300  piperacillin-tazobactam (ZOSYN) IVPB 3.375 g       ? 3.375 g ?12.5 mL/hr over 240 Minutes Intravenous Every 8 hours 11/27/21 2202 12/04/21  2025  ? ?  ? ? ? ?Assessment/Plan ?POD #9 S/p exploratory laparotomy, small bowel resection, drainage of intraabdominal abscesses, and diverting loop colostomy on 12/15/21 by Dr. Zenia Resides for Sigmoid diverticulitis with pelvic abscesses, multiple small bowel interloop abscess, failed nonoperative management with percutaneous drains.  ?- Zosyn 3/25>4/17. Cx w/ Streptococcus constellatus and Bacteroides fragilis that is PCN sensitive.  ?- Repeat CT 4/18 with small fluid collection inferior to liver, enlarging R pelvic fluid collection with drain, other pelvic fluid collections decreased in size. Stable bilateral hydronephrosis ?- IR evaluated and no new drainable collections. ?- afebrile, WBC 14.5 (16.5). continue to monitor. May need repeat CT scan in  a few days to reassess collections if WBC not improving or clinically declines ?- Continue surgical JP drain and IR transgluteal drain. Flush IR drain ?- tolerating soft diet ?- Encouraged mobilization, PT/OT. Rec HH.  ?- Wound vac to midline. M/W/F. WOCN following.  ?- WOC following for ostomy teaching and care. Appears red rubber was removed 4/17 per notes.  ?- Multimodal pain control ?- TOC consult. Will need HH PT/OT/RN and home vac (charity vac approved and will be delivered to room) ?- myself and WOC RN encouraged learning colostomy care today ?  ?FEN: soft ?ID: Zosyn 3/25>>4/17 ?VTE: SCD's, lovenox ?Foley: out 4/13 and voiding with purewick ?  ?ABL anemia - Hgb stable ?Small bilateral pleural effusions with atelectasis ?Bipolar disorder, home meds.  ?Tobacco abuse ? ? LOS: 27 days  ? ? ?Winferd Humphrey , PA-C ?[facility] ?12/24/2021, 9:11 AM ?Please see Amion for pager number during day hours 7:00am-4:30pm ? ?

## 2021-12-24 NOTE — Discharge Instructions (Signed)
[facility]      [facility] [phone]  OPEN ABDOMINAL SURGERY: POST OP INSTRUCTIONS  Always review your discharge instruction sheet given to you by the facility where your surgery was performed.  IF YOU HAVE DISABILITY OR FAMILY LEAVE FORMS, YOU MUST BRING THEM TO THE OFFICE FOR PROCESSING.  PLEASE DO NOT GIVE THEM TO YOUR DOCTOR.  A prescription for pain medication may be given to you upon discharge.  Take your pain medication as prescribed, if needed.  If narcotic pain medicine is not needed, then you may take acetaminophen (Tylenol) or ibuprofen (Advil) as needed. Take your usually prescribed medications unless otherwise directed. If you need a refill on your pain medication, please contact your pharmacy. They will contact our office to request authorization.  Prescriptions will not be filled after 5pm or on week-ends. You should follow a light diet the first few days after arrival home, such as soup and crackers, pudding, etc.unless your doctor has advised otherwise. A high-fiber, low fat diet can be resumed as tolerated.   Be sure to include lots of fluids daily. Most patients will experience some swelling and bruising on the chest and neck area.  Ice packs will help.  Swelling and bruising can take several days to resolve Most patients will experience some swelling and bruising in the area of the incision. Ice pack will help. Swelling and bruising can take several days to resolve..  It is common to experience some constipation if taking pain medication after surgery.  Increasing fluid intake and taking a stool softener will usually help or prevent this problem from occurring.  A mild laxative (Milk of Magnesia or Miralax) should be taken according to package directions if there are no bowel movements after 48 hours.  ACTIVITIES:  You may resume regular (light) daily activities beginning the next day--such as daily self-care, walking, climbing stairs--gradually increasing activities  as tolerated.  You may have sexual intercourse when it is comfortable.  Refrain from any heavy lifting or straining until approved by your doctor. You may drive when you no longer are taking prescription pain medication, you can comfortably wear a seatbelt, and you can safely maneuver your car and apply brakes You should see your doctor in the office for a follow-up appointment approximately two weeks after your surgery.  Make sure that you call for this appointment within a day or two after you arrive home to insure a convenient appointment time.  WHEN TO CALL YOUR DOCTOR: Fever over 101.0 Inability to urinate Nausea and/or vomiting Extreme swelling or bruising Continued bleeding from incision. Increased pain, redness, or drainage from the incision. Difficulty swallowing or breathing Muscle cramping or spasms. Numbness or tingling in hands or feet or around lips.  The clinic staff is available to answer your questions during regular business hours.  Please don't hesitate to call and ask to speak to one of the nurses if you have concerns.  For further questions, please visit [email or web address]   

## 2021-12-24 NOTE — Consult Note (Addendum)
WOC Nurse Consult Note: ?Patient receiving care in [facility] 6N5. M. Kabrich, PA-C at bedside for wound assessment at time of VAC dressing change. ?Reason for Consult: VAC dressing change ?Wound type: surgical ?Pressure Injury POA: Yes/No/NA ?Measurement: ?Wound bed: pink ?Drainage (amount, consistency, odor) serous in cannister ?Periwound: intact ?Dressing procedure/placement/frequency: ?All black foam removed from wound bed. One piece of black foam placed into wound bed. One barrier ring placed along the inferior border to enhance a seal. Drape applied, immediate seal obtained. ? ?WOC Nurse ostomy follow up ?Stoma type/location: LUQ colostomy ?Stomal assessment/size: 1.5 inches, round, moist, red ?Peristomal assessment: intact ?Treatment options for stomal/peristomal skin: barrier ring, convex pouch ?Output: formed brown  ?Ostomy pouching: 1pc. Convex; additional pouches and barrier rings in room. ?Education provided:  ?Patient demonstrated emptying pouch. I instructed the patient on pouch removal--she performed. She assisted stretching the barrier ring. She observed cutting the opening and placement of the pouch. ?Enrolled patient in [place] Secure Start Discharge program: Yes, previously. ? ?Patient and primary RN, Romeo Apple, were instructed that the patient must empty per pouch every time it needs emptying.  ? ? ?Helmut Muster, RN, MSN, CWOCN, CNS-BC, pager [phone]  ?

## 2021-12-24 NOTE — TOC Progression Note (Addendum)
Transition of Care (TOC) - Progression Note  ? ? ?Patient Details  ?Name: SAMEEHA WICHMANN ?MRN: [id number] ?Date of Birth: 22-Apr-1961 ? ?Transition of Care (TOC) CM/SW Contact  ?Marilu Favre, RN ?Phone Number: ?12/24/2021, 10:30 AM ? ?Clinical Narrative:    ? ?Possible discharge Sunday to home with VAC , drains, and ostomy. Jasmine with [facility] aware and NCM emailed LOG for [place] visits a week for two weeks to [place] and [facility] orders and clinicals.  ? ?Home VAC and walker at bedside, called [employer] for 3 in 1  ? ?Current plan to DC on PO ABX  ? ?Asked CCS if possible to send scripts to [place] today  ? ?Expected Discharge Plan: [place] ?Barriers to Discharge: Continued Medical Work up ? ?Expected Discharge Plan and Services ?Expected Discharge Plan: [place] ?  ?Discharge Planning Services: CM Consult ?Post Acute Care Choice: Home Health ?Living arrangements for the past 2 months: [place] ?                ?DME Arranged: Walker rolling ?DME Agency: [employer] ?  ?  ?  ?HH Arranged: PT ?[place] Agency: [place] ?Date HH Agency Contacted: 12/02/21 ?Time [place]: 1310 ?Representative spoke with at [place]: Amy ? ? ?Social Determinants of Health (SDOH) Interventions ?  ? ?Readmission Risk Interventions ?   ? View : No data to display.  ?  ?  ?  ? ? ?

## 2021-12-24 NOTE — Progress Notes (Signed)
Pt emptied her ostomy bag this evening, performed procedure much better than she did this morning. Education and reinforcement will continue ?

## 2021-12-24 NOTE — Progress Notes (Signed)
Pharmacy Antibiotic Note ? ?Judith Miller is a 61 y.o. female admitted on 11/27/2021 with  intra-abd abscesses .  Pharmacy has been consulted for Zosyn dosing. ? ?Pt on Zosyn 3/25-4/17 for intra-abd abscesses which were drained on 4/12. Repeat CT 4/18 shows enlarging R pelvic fluid collection and new small fluid collection inferior to the liver. Plan to restart abx and repeat CT in a few days. ? ?Plan: ?Zosyn 3.375gm IV q8h ?Will f/u clinical condition and renal function ? ?Height: 5\' 4"  (162.6 cm) ?Weight: 82.6 kg (182 lb) ?IBW/kg (Calculated) : 54.7 ? ?Temp (24hrs), Avg:98.4 ?F (36.9 ?C), Min:97.8 ?F (36.6 ?C), Max:99 ?F (37.2 ?C) ? ?Recent Labs  ?Lab 12/20/21 ?0341 12/21/21 ?0502 12/22/21 ?12/24/21 12/23/21 ?0405 12/24/21 ?0425  ?WBC 16.5* 12.3* 16.4* 16.5* 14.5*  ?CREATININE 0.37* <0.30* 0.37* 0.42* 0.41*  ?  ?Estimated Creatinine Clearance: 77.8 mL/min (A) (by C-G formula based on SCr of 0.41 mg/dL (L)).   ? ?Allergies  ?Allergen Reactions  ? Sulfa Antibiotics Other (See Comments)  ?  headache  ? ? ?Antimicrobials this admission: ?Zosyn 3/25 > 4/1, 4/5 >> 4/17; 4/21>> ? ?Microbiology results: ?3/26 abscess: mod Kleb, E.faecium, Pseudomonas, B.fragilis ?4/4 RLQ abscess - E.coli (panS), Kleb pneumo (panS), Strep constellatus (R emycin) ?4/12 abscessCx: Strep constellatus (R emycin), abundant bacteroides ? ?Thank you for allowing pharmacy to be a part of this patient?s care. ? ?6/12, PharmD, BCPS ?Please see amion for complete clinical pharmacist phone list ?12/24/2021 10:15 AM ? ?

## 2021-12-25 LAB — CBC
HCT: 23.2 % — ABNORMAL LOW (ref 36.0–46.0)
Hemoglobin: 7.3 g/dL — ABNORMAL LOW (ref 12.0–15.0)
MCH: 26.6 pg (ref 26.0–34.0)
MCHC: 31.5 g/dL (ref 30.0–36.0)
MCV: 84.7 fL (ref 80.0–100.0)
Platelets: 582 10*3/uL — ABNORMAL HIGH (ref 150–400)
RBC: 2.74 MIL/uL — ABNORMAL LOW (ref 3.87–5.11)
RDW: 15.8 % — ABNORMAL HIGH (ref 11.5–15.5)
WBC: 11.9 10*3/uL — ABNORMAL HIGH (ref 4.0–10.5)
nRBC: 0 % (ref 0.0–0.2)

## 2021-12-25 MED ORDER — MAGIC MOUTHWASH
15.0000 mL | Freq: Three times a day (TID) | ORAL | Status: DC
  Filled 2021-12-25 (×12): qty 15

## 2021-12-25 MED ORDER — KETOROLAC TROMETHAMINE 15 MG/ML IJ SOLN
15.0000 mg | Freq: Three times a day (TID) | INTRAMUSCULAR | Status: DC | PRN
  Administered 2021-12-25 – 2021-12-28 (×6): 15 mg via INTRAVENOUS
  Filled 2021-12-25 (×6): qty 1

## 2021-12-25 NOTE — Progress Notes (Signed)
Mobility Specialist Progress Note: ? ? 12/25/21 1651  ?Mobility  ?Activity Ambulated with assistance in hallway  ?Level of Assistance Independent after set-up  ?Assistive Device None  ?Distance Ambulated (ft) 550 ft  ?Activity Response Tolerated well  ?$Mobility charge 1 Mobility  ? ?Pt received in bed willing to participate in mobility. Complaint of pain at the drain sites. Left in bed with call bell in reach and all needs met.  ? ?Judith Miller ?Mobility Specialist ?Primary Phone [phone] ? ?

## 2021-12-25 NOTE — Progress Notes (Signed)
10 Days Post-Op  ? ?Subjective/Chief Complaint: ?PT doing well this AM ?Tol PO ?Some Jp drain pain ? ? ?Objective: ?Vital signs in last 24 hours: ?Temp:  [98.1 ?F (36.7 ?C)-98.7 ?F (37.1 ?C)] 98.5 ?F (36.9 ?C) (04/22 8144) ?Pulse Rate:  [85-92] 87 (04/22 0728) ?Resp:  [16-18] 16 (04/22 0728) ?BP: (127-142)/(55-68) 127/55 (04/22 0728) ?SpO2:  [90 %-96 %] 90 % (04/22 0728) ?Last BM Date : 12/24/21 ? ?Intake/Output from previous day: ?04/21 0701 - 04/22 0700 ?In: -  ?Out: 15 [Drains:15] ?Intake/Output this shift: ?No intake/output data recorded. ? ?General appearance: alert and cooperative ?GI: soft, non-tender; bowel sounds normal; no masses,  no organomegaly and mid line inc c/d/i ? ?Lab Results:  ?Recent Labs  ?  12/24/21 ?0425 12/25/21 ?0431  ?WBC 14.5* 11.9*  ?HGB 7.7* 7.3*  ?HCT 24.3* 23.2*  ?PLT 582* 582*  ? ?BMET ?Recent Labs  ?  12/23/21 ?0405 12/24/21 ?0425  ?NA 131* 133*  ?K 3.8 3.6  ?CL 97* 99  ?CO2 26 28  ?GLUCOSE 105* 97  ?BUN 14 18  ?CREATININE 0.42* 0.41*  ?CALCIUM 8.7* 9.1  ? ?PT/INR ?No results for input(s): LABPROT, INR in the last 72 hours. ?ABG ?No results for input(s): PHART, HCO3 in the last 72 hours. ? ?Invalid input(s): PCO2, PO2 ? ?Studies/Results: ?No results found. ? ?Anti-infectives: ?Anti-infectives (From admission, onward)  ? ? Start     Dose/Rate Route Frequency Ordered Stop  ? 12/24/21 1115  piperacillin-tazobactam (ZOSYN) IVPB 3.375 g       ? 3.375 g ?12.5 mL/hr over 240 Minutes Intravenous Every 8 hours 12/24/21 1022    ? 12/15/21 2200  piperacillin-tazobactam (ZOSYN) IVPB 3.375 g       ? 3.375 g ?12.5 mL/hr over 240 Minutes Intravenous Every 8 hours 12/15/21 1457 12/20/21 1834  ? 12/08/21 1500  piperacillin-tazobactam (ZOSYN) IVPB 3.375 g  Status:  Discontinued       ? 3.375 g ?12.5 mL/hr over 240 Minutes Intravenous Every 8 hours 12/08/21 1414 12/15/21 1457  ? 11/27/21 2300  piperacillin-tazobactam (ZOSYN) IVPB 3.375 g       ? 3.375 g ?12.5 mL/hr over 240 Minutes Intravenous Every 8  hours 11/27/21 2202 12/04/21 2025  ? ?  ? ? ?Assessment/Plan: ?POD #10 S/p exploratory laparotomy, small bowel resection, drainage of intraabdominal abscesses, and diverting loop colostomy on 12/15/21 by Dr. Freida Busman for Sigmoid diverticulitis with pelvic abscesses, multiple small bowel interloop abscess, failed nonoperative management with percutaneous drains.  ?- Zosyn 3/25>4/17. Cx w/ Streptococcus constellatus and Bacteroides fragilis that is PCN sensitive.  ?- Repeat CT 4/18 with small fluid collection inferior to liver, enlarging R pelvic fluid collection with drain, other pelvic fluid collections decreased in size. Stable bilateral hydronephrosis ?- IR evaluated and no new drainable collections. ?- afebrile, WBC 14.5 (16.5). continue to monitor. May need repeat CT scan in a few days to reassess collections if WBC not improving or clinically declines ?- Continue surgical JP drain and IR transgluteal drain. Flush IR drain ?- tolerating soft diet ?- Encouraged mobilization, PT/OT. Rec HH.  ?- Wound vac to midline. M/W/F. WOCN following.  ?- WOC following for ostomy teaching and care. Appears red rubber was removed 4/17 per notes.  ?- Multimodal pain control ?- TOC consult. Will need HH PT/OT/RN and home vac (charity vac approved and will be delivered to room) ?- myself and WOC RN encouraged learning colostomy care today ?  ?FEN: soft ?ID: Zosyn 3/25>>4/17 ?VTE: SCD's, lovenox ?Foley: out 4/13 and  voiding with purewick ?  ?ABL anemia - Hgb stable ?Small bilateral pleural effusions with atelectasis ?Bipolar disorder, home meds.  ?Tobacco abuse ? ? LOS: 28 days  ? ? ?Axel Filler ?12/25/2021 ? ?

## 2021-12-25 NOTE — Progress Notes (Signed)
? ? ?Referring Physician(s): ?Allen,S ? ?Supervising Physician: Mosie Epstein ? ?Patient Status:  [facility] - In-pt ? ?Chief Complaint: ? ?Pelvic pain/abscess ? ?Subjective: ?Pt cont to have some soreness and leaking from LLQ/TG drain; on exam today there appears to be a broken piece of plastic from stopcock which is jammed into proximal hub of drain preventing flushing or drainage ? ? ?Allergies: ?Sulfa antibiotics ? ?Medications: ?Prior to Admission medications   ?Medication Sig Start Date End Date Taking? Authorizing Provider  ?risperiDONE (RISPERDAL) 0.5 MG tablet Take 0.5 mg by mouth in the morning.   Yes [provider]  ?risperiDONE (RISPERDAL) 1 MG tablet Take 1.5 mg by mouth at bedtime.   Yes [provider]  ?vitamin B-12 (CYANOCOBALAMIN) 500 MCG tablet Take 500 mcg by mouth daily.   Yes [provider]  ? ? ? ?Vital Signs: ?BP (!) 127/55 (BP Location: Left Arm)   Pulse 87   Temp 98.5 ?F (36.9 ?C) (Oral)   Resp 16   Ht 5\' 4"  (1.626 m)   Wt 182 lb (82.6 kg)   SpO2 90%   BMI 31.24 kg/m?  ? ?Physical Exam awake/alert; LLQ drain in place ; insertion site mild- mod tender with some leakage noted; minimal brown fluid in JP bulb; there appears to be a broken piece of plastic from stopcock which is jammed into proximal hub of drain preventing flushing or drainage ? ?Imaging: ?CT ABDOMEN PELVIS W CONTRAST ? ?Result Date: 12/21/2021 ?CLINICAL DATA:  Postoperative abdominal pain. Small-bowel resection and intra-abdominal abscess. Diverting loop colostomy. EXAM: CT ABDOMEN AND PELVIS WITH CONTRAST TECHNIQUE: Multidetector CT imaging of the abdomen and pelvis was performed using the standard protocol following bolus administration of intravenous contrast. RADIATION DOSE REDUCTION: This exam was performed according to the departmental dose-optimization program which includes automated exposure control, adjustment of the mA and/or kV according to patient size and/or use of iterative reconstruction  technique. CONTRAST:  12/23/2021 OMNIPAQUE IOHEXOL 300 MG/ML  SOLN COMPARISON:  CT abdomen and pelvis 12/14/2021. FINDINGS: Lower chest: Atelectasis in the bilateral lower lobes as well as airspace disease in the right lower lobe, unchanged from prior. Hepatobiliary: No focal liver abnormality is seen. No gallstones, gallbladder wall thickening, or biliary dilatation. Pancreas: Unremarkable. No pancreatic ductal dilatation or surrounding inflammatory changes. Spleen: Normal in size without focal abnormality. Adrenals/Urinary Tract: Left adrenal thickening is unchanged. Right adrenal gland within normal limits. There is mild left and mild-to-moderate right hydroureteronephrosis to the level of the pelvis similar to the prior study. No obstructing calculi are seen. There is a small amount of air in the bladder. The bladder is otherwise within normal limits. Stomach/Bowel: Oral contrast reaches the rectum. There is no extravasation of oral contrast. There is no evidence for bowel obstruction. There is a new diverting transverse colostomy. Stomach is nondilated. There is colonic diverticulosis without evidence for diverticulitis. The appendix is within limits. There is some wall thickening of small bowel loops in the pelvis. Vascular/Lymphatic: Aortic atherosclerosis. No enlarged abdominal or pelvic lymph nodes. Reproductive: Uterus is not definitely seen. Other: There is a new right-sided abdominal approach pelvic catheter with distal tip in the central lower pelvis. This catheter tip is in pelvic air-fluid collection right of midline measuring 5.9 x 4.8 cm. This collection has increased in size. Previous identified midline anterior lower abdominal catheter has been removed. Posterior approach percutaneous drainage catheter is again seen with distal tip and pelvic air-fluid collection. This collection measures 4.6 x 5.5 cm and has slightly decreased  in size. Previously identified central mesenteric enhancing fluid  collection containing air is no longer seen. There is some residual mesenteric edema and air in this level which appears un organized. There is a new small amount of free air in the anterior abdomen likely related to recent abdominal surgery. There is also a new ill-defined fluid collection abutting the lower anterior abdominal wall measuring 1.7 x 5.1 by 2.0 cm. There is no air or wall enhancement in this region. Previously identified percutaneous drainage catheter adjacent to the inferior tip of the liver has been removed. There is reaccumulation of enhancing fluid collection at this level adjacent to the tip of the liver measuring 2.1 x 4.8 by 2.0 cm. Enhancing fluid collection adjacent to the posterior aspect of the liver has slightly decreased in size now measuring 1.7 x 1.9 cm. There is diffuse body wall edema as seen on the prior study. Musculoskeletal: Degenerative changes at L5-S1 IMPRESSION: 1. New postsurgical changes of diverting transverse colostomy. There is no bowel obstruction. 2. There is wall thickening of small bowel loops in the lower anterior abdomen which may represent enteritis. Sigmoid colon diverticulosis is unchanged. 3. Percutaneous drainage catheter adjacent to the inferior to the liver has been removed and there is new small enhancing fluid collection in this region worrisome for abscess. 4. Enlarging right pelvic air-fluid collection with new percutaneous drainage catheter in place. 5. Other pelvic and abdominal fluid collections have slightly decreased in size. In particular, central mesenteric fluid collection is no longer seen. 6. Stable bilateral hydronephrosis likely secondary to pelvic fluid collections. Electronically Signed   By: Darliss CheneyAmy  Guttmann M.D.   On: 12/21/2021 20:35   ? ?Labs: ? ?CBC: ?Recent Labs  ?  12/22/21 ?0349 12/23/21 ?0405 12/24/21 ?0425 12/25/21 ?0431  ?WBC 16.4* 16.5* 14.5* 11.9*  ?HGB 7.7* 7.6* 7.7* 7.3*  ?HCT 23.7* 23.9* 24.3* 23.2*  ?PLT 540* 578* 582* 582*   ? ? ?COAGS: ?Recent Labs  ?  11/28/21 ?1054  ?INR 1.2  ? ? ?BMP: ?Recent Labs  ?  12/21/21 ?0502 12/22/21 ?[id number] 12/23/21 ?0405 12/24/21 ?0425  ?NA 132* 135 131* 133*  ?K 4.5 4.3 3.8 3.6  ?CL 97* 101 97* 99  ?CO2 28 27 26 28   ?GLUCOSE 115* 99 105* 97  ?BUN 14 11 14 18   ?CALCIUM 8.2* 8.5* 8.7* 9.1  ?CREATININE <0.30* 0.37* 0.42* 0.41*  ?GFRNONAA NOT CALCULATED >60 >60 >60  ? ? ?LIVER FUNCTION TESTS: ?Recent Labs  ?  12/16/21 ?[id number] 12/19/21 ?0303 12/20/21 ?0341  ?BILITOT 0.3  --  0.4  ?AST 15  --  21  ?ALT 16  --  20  ?ALKPHOS 52  --  65  ?PROT 5.5*  --  5.7*  ?ALBUMIN <1.5* 1.6* 1.6*  ? ? ?Assessment and Plan: ?POD #10 S/p exploratory laparotomy, small bowel resection, drainage of intraabdominal abscesses, and diverting loop colostomy on 12/15/21 by Dr. Freida BusmanAllen for Sigmoid diverticulitis with pelvic abscesses, multiple small bowel interloop abscess, failed nonoperative management with percutaneous drains; IR has 1 drain remaining in LLQ (10 fr to JP placed 3/26) ) region(TG approach) which is currently nonfunctional secondary to jammed plastic in hub from broken stopcock; will plan for drain exchange on 4/24; pt aware ? ? ?Electronically Signed: ?Chinita Pester. Kevin Robson Trickey, PA-C ?12/25/2021, 12:29 PM ? ? ?I spent a total of 15 Minutes at the the patient's bedside AND on the patient's hospital floor or unit, greater than 50% of which was counseling/coordinating care for left pelvic abscess drain ? ? ? ?  Patient ID: Judith Miller, female   DOB: September 20, 1960, 61 y.o.   MRN: [id number] ? ?

## 2021-12-26 LAB — CBC
HCT: 22.9 % — ABNORMAL LOW (ref 36.0–46.0)
Hemoglobin: 7.1 g/dL — ABNORMAL LOW (ref 12.0–15.0)
MCH: 26.3 pg (ref 26.0–34.0)
MCHC: 31 g/dL (ref 30.0–36.0)
MCV: 84.8 fL (ref 80.0–100.0)
Platelets: 552 10*3/uL — ABNORMAL HIGH (ref 150–400)
RBC: 2.7 MIL/uL — ABNORMAL LOW (ref 3.87–5.11)
RDW: 15.9 % — ABNORMAL HIGH (ref 11.5–15.5)
WBC: 10.6 10*3/uL — ABNORMAL HIGH (ref 4.0–10.5)
nRBC: 0 % (ref 0.0–0.2)

## 2021-12-26 NOTE — Progress Notes (Signed)
11 Days Post-Op  ? ?Subjective/Chief Complaint: ?Patient doing well ?No significant changes overnight ? ? ?Objective: ?Vital signs in last 24 hours: ?Temp:  [97.8 ?F (36.6 ?C)-99.2 ?F (37.3 ?C)] 98.1 ?F (36.7 ?C) (04/23 0439) ?Pulse Rate:  [84-97] 86 (04/23 0439) ?Resp:  [16-18] 18 (04/23 0439) ?BP: (125-133)/(53-64) 125/53 (04/23 0439) ?SpO2:  [85 %-98 %] 98 % (04/23 0455) ?Last BM Date : 12/25/21 ? ?Intake/Output from previous day: ?04/22 0701 - 04/23 0700 ?In: 371.2 [P.O.:120; IV Piggyback:251.2] ?Out: 54 [Drains:65; Stool:20] ?Intake/Output this shift: ?No intake/output data recorded. ? ?General appearance: alert and cooperative ?GI: soft, non-tender; bowel sounds normal; no masses,  no organomegaly and mid line inc c/d/I, ostomy patent ? ? ?Lab Results:  ?Recent Labs  ?  12/25/21 ?0431 12/26/21 ?0424  ?WBC 11.9* 10.6*  ?HGB 7.3* 7.1*  ?HCT 23.2* 22.9*  ?PLT 582* 552*  ? ?BMET ?Recent Labs  ?  12/24/21 ?0425  ?NA 133*  ?K 3.6  ?CL 99  ?CO2 28  ?GLUCOSE 97  ?BUN 18  ?CREATININE 0.41*  ?CALCIUM 9.1  ? ?PT/INR ?No results for input(s): LABPROT, INR in the last 72 hours. ?ABG ?No results for input(s): PHART, HCO3 in the last 72 hours. ? ?Invalid input(s): PCO2, PO2 ? ?Studies/Results: ?No results found. ? ?Anti-infectives: ?Anti-infectives (From admission, onward)  ? ? Start     Dose/Rate Route Frequency Ordered Stop  ? 12/24/21 1115  piperacillin-tazobactam (ZOSYN) IVPB 3.375 g       ? 3.375 g ?12.5 mL/hr over 240 Minutes Intravenous Every 8 hours 12/24/21 1022    ? 12/15/21 2200  piperacillin-tazobactam (ZOSYN) IVPB 3.375 g       ? 3.375 g ?12.5 mL/hr over 240 Minutes Intravenous Every 8 hours 12/15/21 1457 12/20/21 1834  ? 12/08/21 1500  piperacillin-tazobactam (ZOSYN) IVPB 3.375 g  Status:  Discontinued       ? 3.375 g ?12.5 mL/hr over 240 Minutes Intravenous Every 8 hours 12/08/21 1414 12/15/21 1457  ? 11/27/21 2300  piperacillin-tazobactam (ZOSYN) IVPB 3.375 g       ? 3.375 g ?12.5 mL/hr over 240 Minutes  Intravenous Every 8 hours 11/27/21 2202 12/04/21 2025  ? ?  ? ? ?Assessment/Plan: ?POD #11 S/p exploratory laparotomy, small bowel resection, drainage of intraabdominal abscesses, and diverting loop colostomy on 12/15/21 by Dr. Zenia Resides for Sigmoid diverticulitis with pelvic abscesses, multiple small bowel interloop abscess, failed nonoperative management with percutaneous drains.  ?- Zosyn 3/25>4/17. Cx w/ Streptococcus constellatus and Bacteroides fragilis that is PCN sensitive.  ?- Repeat CT 4/18 with small fluid collection inferior to liver, enlarging R pelvic fluid collection with drain, other pelvic fluid collections decreased in size. Stable bilateral hydronephrosis ?- IR evaluated and no new drainable collections. ?- afebrile, WBC 10.6. continue to monitor. May need repeat CT scan in a few days to reassess collections if WBC not improving or clinically declines ?- Continue surgical JP drain and IR transgluteal drain. Flush IR drain ?- tolerating soft diet ?- Encouraged mobilization, PT/OT. Rec HH.  ?- Wound vac to midline. M/W/F. WOCN following.  ?- WOC following for ostomy teaching and care. Appears red rubber was removed 4/17 per notes.  ?- Multimodal pain control ?- TOC consult. Will need HH PT/OT/RN and home vac (charity vac approved and will be delivered to room) ?- myself and WOC RN encouraged learning colostomy care today ?  ?FEN: soft ?ID: Zosyn 3/25>>4/17 ?VTE: SCD's, lovenox ?Foley: out 4/13 and voiding with purewick ?  ?ABL anemia - Hgb  stable ?Small bilateral pleural effusions with atelectasis ?Bipolar disorder, home meds.  ?Tobacco abuse ? LOS: 29 days  ? ? ?Ralene Ok ?12/26/2021 ? ?

## 2021-12-26 NOTE — Progress Notes (Signed)
Mobility Specialist Progress Note: ? ? 12/26/21 1309  ?Mobility  ?Activity Ambulated with assistance in hallway  ?Level of Assistance Independent after set-up  ?Assistive Device None  ?Distance Ambulated (ft) 300 ft  ?Activity Response Tolerated well  ?$Mobility charge 1 Mobility  ? ?Pt received in bed willing to participate in mobility. Complaints of pain at drain sites. During ambulation pt felt something wet run down her leg. Ambulated pt back to to room and saw the drain in the left buttock was leaking down her leg, RN notified. Left in chair with call bell in reach and all needs met.  ? ?Judith Miller ?Mobility Specialist ?Primary Phone [phone] ? ?

## 2021-12-27 ENCOUNTER — Inpatient Hospital Stay (HOSPITAL_COMMUNITY): Payer: Self-pay

## 2021-12-27 HISTORY — PX: IR CATHETER TUBE CHANGE: IMG717

## 2021-12-27 MED ORDER — IOHEXOL 300 MG/ML  SOLN
100.0000 mL | Freq: Once | INTRAMUSCULAR | Status: AC | PRN
  Administered 2021-12-27: 10 mL

## 2021-12-27 MED ORDER — LIDOCAINE HCL 1 % IJ SOLN
INTRAMUSCULAR | Status: AC
  Administered 2021-12-27: 10 mL
  Filled 2021-12-27: qty 20

## 2021-12-27 MED ORDER — ENOXAPARIN SODIUM 40 MG/0.4ML IJ SOSY
40.0000 mg | PREFILLED_SYRINGE | INTRAMUSCULAR | Status: DC
  Administered 2021-12-27: 40 mg via SUBCUTANEOUS
  Filled 2021-12-27 (×2): qty 0.4

## 2021-12-27 NOTE — Progress Notes (Signed)
Mobility Specialist Progress Note: ? ? 12/27/21 1011  ?Mobility  ?Activity Ambulated with assistance in hallway  ?Level of Assistance Independent after set-up  ?Assistive Device None  ?Distance Ambulated (ft) 300 ft  ?Activity Response Tolerated well  ?$Mobility charge 1 Mobility  ? ?Pt received in bed willing to participate in mobility. Complaints of 5/10 pain in abdomen. Left EOB with cal bell in reach and all needs met.   ? ?Tiasha Helvie ?Mobility Specialist ?Primary Phone [phone] ? ?

## 2021-12-27 NOTE — Consult Note (Signed)
[place] Nurse wound follow up ?Wound type:midline abdominal with NPWT (VAC) in place.  Discussed going home with VAC and that she will need to schedule HH visits and take PO analgesia an hour before they come.  She agrees and states she is worried about going home.  Emotional support provided that the [facility] team will manage her wound for now and she is nearly independent with ostomy care but that will be changed with abdominal wound for now. Discussed nutrition, hydration, exercise and the role in wound healing.    ?Measurement: 19 cm x 6 cm x 3 cm  ?Wound IQN:VVYXA red ?Drainage (amount, consistency, odor) minimal serosanguinous   ?HAs surgical JP drain and IR transgluteal drain.   Hub was broken and exchanged this AM.  Creamy drainage noted.  ?Periwound: LMQ colostomy (edema is improving to abdomen, this is more LUQ at this time)  ?Dressing procedure/placement/frequency: 2 pieces black foam to wound bed to adequately fill dead space.  Barrier ring to distal end to promote seal.  ?Seal immediately achieved.  Change Mon/Wed/FRi.  May discharge tomorrow.  ? ?[place] Nurse ostomy follow up ?Stoma type/location:  LUQ colostomy (abdominal edema resolving)  ?Stomal assessment/size: oval 1 cm x 1.5 cm today. Pattern made and patient cut this out.  ?Peristomal assessment:  intact  midline abdominal wound in close proximity with ostomy  ?Treatment options for stomal/peristomal skin: ongoing teaching.  ?Barrier ring ?1 piece convex ?Output soft brown stool ?Ostomy pouching: 1pc.convex with barrier ring ?Education provided: Patient applied barrier ring with minimal assistance.  She understands this will be easier in a mirror and agrees to perform that way at home.  ?She cuts the pouch out in pattern I drew today. Left in room with supplies. She understands she has an oval stoma and that is different than before.  ?She applies pouch and seals all edges.  She rolls pouch closed.  She is hesitant to perform care but is feeling more  comfortable.  We discuss emptying when 1/3 full.  She agrees to continue to do this.  Bedside RN in room and will encourage her as well.  ?Enrolled patient in [place] Discharge program: Yes  kit arrived at home.  ?Supplies in room for discharge.  ?Will follow.  ?Domenic Moras MSN, RN, FNP-BC CWON ?Wound, Ostomy, Continence Nurse ?Pager [phone]  ? ?  ?

## 2021-12-27 NOTE — Progress Notes (Signed)
12 Days Post-Op  ? ?Subjective/Chief Complaint: ?Patient doing well ?Drains exchanged this morning. ? ? ?Objective: ?Vital signs in last 24 hours: ?Temp:  [97.5 ?F (36.4 ?C)-99.4 ?F (37.4 ?C)] 97.5 ?F (36.4 ?C) (04/24 [id number]) ?Pulse Rate:  [64-88] 76 (04/24 0937) ?Resp:  [14-20] 16 (04/24 [id number]) ?BP: (115-136)/(54-62) 128/62 (04/24 [id number]) ?SpO2:  [88 %-95 %] 91 % (04/24 0937) ?Last BM Date : 12/26/21 ? ?Intake/Output from previous day: ?04/23 0701 - 04/24 0700 ?In: 67.4 [IV Piggyback:67.4] ?Out: 150 [Drains:50; Stool:100] ?Intake/Output this shift: ?No intake/output data recorded. ? ?General appearance: alert and cooperative ?GI: soft, non-tender; bowel sounds normal; no masses,  no organomegaly and mid line inc c/d/I, ostomy patent ? ? ?Lab Results:  ?Recent Labs  ?  12/25/21 ?0431 12/26/21 ?0424  ?WBC 11.9* 10.6*  ?HGB 7.3* 7.1*  ?HCT 23.2* 22.9*  ?PLT 582* 552*  ? ?BMET ?No results for input(s): NA, K, CL, CO2, GLUCOSE, BUN, CREATININE, CALCIUM in the last 72 hours. ? ?PT/INR ?No results for input(s): LABPROT, INR in the last 72 hours. ?ABG ?No results for input(s): PHART, HCO3 in the last 72 hours. ? ?Invalid input(s): PCO2, PO2 ? ?Studies/Results: ?IR Catheter Tube Change ? ?Result Date: 12/27/2021 ?CLINICAL DATA:  History sigmoid diverticulitis complicated by development of multiple small bowel interloop abscesses, ultimately post exploratory laparotomy, small-bowel resection, drainage of intra-abdominal abscesses and diverting loop colostomy on 12/15/2021 (Dr. Zenia Resides). While the patient has previously had multiple percutaneous drainage catheters, presently, she has only a solitary left trans gluteal approach percutaneous drainage catheter remains. The external portion of the drainage catheter is currently clogged and the drainage catheter was noted to be slightly retracted on abdominal CT performed 12/21/2021. As such, patient presents for image guided drainage catheter exchange and repositioning. EXAM: IR CATHETER  TUBE CHANGE COMPARISON:  CT abdomen pelvis-12/21/2021; 12/14/2021; 12/05/2021 CONTRAST:  10 cc Omnipaque 300 - administered via the percutaneous drainage catheter. MEDICATIONS: None. ANESTHESIA/SEDATION: None FLUOROSCOPY TIME:  20 mGy TECHNIQUE: Patient was positioned right lateral decubitus on the fluoroscopy table. The external portion of the existing percutaneous drainage catheter as well as the surrounding skin was prepped and draped in usual sterile fashion. A preprocedural spot fluoroscopic image was obtained of the existing percutaneous drainage catheter. A small amount of contrast was injected via the existing percutaneous drainage catheter and several fluoroscopic images were obtained in various obliquities. The external portion of the percutaneous drainage catheter was cut and cannulated with a short Amplatz wire. Under intermittent fluoroscopic guidance, the existing percutaneous drainage catheter was exchanged for a new 10 French percutaneous drainage catheter with end coiled and locked within the decompressed abscess cavity. Contrast injection confirmed appropriate position functionality of the percutaneous drainage catheter. The percutaneous drainage catheter was connected to a gravity bag and secured in place within interrupted suture and a StatLock device. A dressing was applied. The patient tolerated the procedure well without immediate postprocedural complication. FINDINGS: Preprocedural spot fluoroscopic image demonstrates retraction of the percutaneous catheter with radiopaque side marker at the level of the sacrum. Contrast injection demonstrates opacification of the decompressed abscess cavity with communication to the more anteriorly positioned large bore surgical drainage catheter Following fluoroscopic guided exchange, the new drainage catheter is more ideally positioned with end coiled and locked within the more central aspect of the posterior component of the pelvic abscess. IMPRESSION: 1.  Successful fluoroscopic guided drainage catheter exchange and repositioning. 2. Contrast injection demonstrates communication between the posterior component of the pelvic abscess and the large bore surgical  drainage catheter located within the anterior component of the pelvic abscess. PLAN: - Recommend flushing the percutaneous catheter with 10 cc of normal saline at least twice per day. - Maintain diligent records regarding daily drainage catheter output. Electronically Signed   By: Sandi Mariscal M.D.   On: 12/27/2021 09:52   ? ?Anti-infectives: ?Anti-infectives (From admission, onward)  ? ? Start     Dose/Rate Route Frequency Ordered Stop  ? 12/24/21 1115  piperacillin-tazobactam (ZOSYN) IVPB 3.375 g       ? 3.375 g ?12.5 mL/hr over 240 Minutes Intravenous Every 8 hours 12/24/21 1022    ? 12/15/21 2200  piperacillin-tazobactam (ZOSYN) IVPB 3.375 g       ? 3.375 g ?12.5 mL/hr over 240 Minutes Intravenous Every 8 hours 12/15/21 1457 12/20/21 1834  ? 12/08/21 1500  piperacillin-tazobactam (ZOSYN) IVPB 3.375 g  Status:  Discontinued       ? 3.375 g ?12.5 mL/hr over 240 Minutes Intravenous Every 8 hours 12/08/21 1414 12/15/21 1457  ? 11/27/21 2300  piperacillin-tazobactam (ZOSYN) IVPB 3.375 g       ? 3.375 g ?12.5 mL/hr over 240 Minutes Intravenous Every 8 hours 11/27/21 2202 12/04/21 2025  ? ?  ? ? ?Assessment/Plan: ?POD #12 S/p exploratory laparotomy, small bowel resection, drainage of intraabdominal abscesses, and diverting loop colostomy on 12/15/21 by Dr. Zenia Resides for Sigmoid diverticulitis with pelvic abscesses, multiple small bowel interloop abscess, failed nonoperative management with percutaneous drains.  ?- Zosyn 3/25>4/17. Cx w/ Streptococcus constellatus and Bacteroides fragilis that is PCN sensitive.  ?- Repeat CT 4/18 with small fluid collection inferior to liver, enlarging R pelvic fluid collection with drain, other pelvic fluid collections decreased in size. Stable bilateral hydronephrosis ?- IR evaluated  and no new drainable collections. ?- afebrile, WBC 10.6.  ?- Continue surgical JP drain and IR transgluteal drain -- this was exchanged today because the hub on the previous drain was broken. ?- tolerating soft diet ?- Encouraged mobilization, PT/OT. Rec HH.  ?- Wound vac to midline. M/W/F. WOCN following.  ?- WOC following for ostomy teaching and care. Appears red rubber was removed 4/17 per notes.  ?- Multimodal pain control ?- TOC consult. Will need HH PT/OT/RN and home vac (charity vac approved and will be delivered to room) ?- ostomy teaching again today. Possible discharge home tomorrow 4/25 on PO abx. Drains in place. ?  ?FEN: soft ?ID: Zosyn 3/25>>4/17, 4/21 >> ?VTE: SCD's, lovenox ?Foley: out 4/13 and voiding with purewick ?  ?ABL anemia - Hgb stable ?Small bilateral pleural effusions with atelectasis ?Bipolar disorder, home meds.  ?Tobacco abuse ? LOS: 30 days  ? ? ?[place] ?12/27/2021 ? ?

## 2021-12-27 NOTE — Progress Notes (Signed)
Physical Therapy Treatment ?Patient Details ?Name: Judith Miller ?MRN: [id number] ?DOB: [birth year] ?Today's Date: 12/27/2021 ? ? ?History of Present Illness 61 y.o. female presents to [facility] hospital 11/27/2021 as a transfer from [facility] with perforated sigmoid colon. Pt underwent CT guided pelvic drain placement on 3/26. 12/15/21 underwent ex lap with SBRand colostomy with placement of wound vac due to large abscess and persistent pelvic fluid collection.  PMH includes bipolar disorder. ? ?  ?PT Comments  ? ? Today's skilled session continued to focus on mobility progression with stair training performed today. No issues noted. The pt is making steady progress toward goals and should benefit from continued PT to progress toward unmet goals.  ?   ?Recommendations for follow up therapy are one component of a multi-disciplinary discharge planning process, led by the attending physician.  Recommendations may be updated based on patient status, additional functional criteria and insurance authorization. ? ?Follow Up Recommendations ? Home health PT ?  ?  ?Assistance Recommended at Discharge Intermittent Supervision/Assistance  ?Patient can return home with the following [name];[name];Assistance with cooking/housework;Assist for transportation;[name] ?  ?Equipment Recommendations ? Rolling walker (2 wheels)  ?  ?Precautions / Restrictions Precautions ?Precautions: Fall ?Precaution Comments: JP drain x 2, new colostomy, wound vac, abdominal precautions for comfort ?Restrictions ?Weight Bearing Restrictions: No  ?  ? ?Mobility ? Bed Mobility ?  ?Bed Mobility: Sit to Supine ?  ?  ?  ?Sit to supine: Supervision ?  ?General bed mobility comments: pt seated edge of bed on arrival finishing OT session. supervision for sitting to supine at end of session with no rails, HOB ~20 degrees. pt then able to use bil arms on bed  surface to shift hips over and up in bed. ?  ? ?Transfers ?Overall transfer level: Modified independent ?Equipment used: None ?Transfers: Sit to/from Stand ?Sit to Stand: Modified independent (Device/Increase time) ?  ?  ?  ?  ?  ?General transfer comment: needs increased time, no cues or physical assitance needed ?  ? ?Ambulation/Gait ?Ambulation/Gait assistance: Supervision ?Gait Distance (Feet): 350 Feet ?Assistive device: None ?Gait Pattern/deviations: Step-through pattern, Decreased stride length ?Gait velocity: decreased ?  ?  ?General Gait Details: no balance issues noted ? ? ?Stairs ?Stairs: Yes ?Stairs assistance: Supervision ?Stair Management: One rail Right, Step to pattern, Forwards ?Number of Stairs: 2 ?General stair comments: pt has a pole she uses at home to ascend/descend stairs, use rail today. no assist or cues needed ? ? ? ? ? ?  ?Cognition Arousal/Alertness: Awake/alert ?Behavior During Therapy: [facility] for tasks assessed/performed ?Overall Cognitive Status: Within Functional Limits for tasks assessed ?  ?  ?  ? ?  ? ?Pertinent Vitals/Pain Pain Assessment ?Pain Assessment: 0-10 ?Pain Score: 8  ?Pain Location: abdomen ?Pain Descriptors / Indicators: Guarding, Grimacing, Sore, Tender ?Pain Intervention(s): Monitored during session, Repositioned, Patient requesting pain meds-RN notified, Limited activity within patient's tolerance  ? ? ? ?PT Goals (current goals can now be found in the care plan section) Acute Rehab PT Goals ?Patient Stated Goal: go back to work ?PT Goal Formulation: With patient ?Time For Goal Achievement: 01/03/22 ?Potential to Achieve Goals: Good ?Progress towards PT goals: Progressing toward goals ? ?  ?Frequency ? ? ? Min 3X/week ? ? ? ?  ?PT Plan Current plan remains appropriate  ? ? ?AM-PAC PT "6 Clicks" Mobility   ?Outcome Measure ? Help needed turning from  your back to your side while in a flat bed without using bedrails?: None ?Help needed moving from lying on your back to  sitting on the side of a flat bed without using bedrails?: None ?Help needed moving to and from a bed to a chair (including a wheelchair)?: None ?Help needed standing up from a chair using your arms (e.g., wheelchair or bedside chair)?: None ?Help needed to walk in hospital room?: None ?Help needed climbing 3-5 steps with a railing? : A Little ?6 Click Score: 23 ? ?  ?End of Session   ?Activity Tolerance: Patient tolerated treatment well;Patient limited by pain ?Patient left: in bed;with call bell/phone within reach ?Nurse Communication: Mobility status;Patient requests pain meds ?PT Visit Diagnosis: Other abnormalities of gait and mobility (R26.89);Muscle weakness (generalized) (M62.81);Pain ?Pain - Right/Left:  (abdomen) ?Pain - part of body:  (abdomen) ?  ? ? ?Time: 0071-2197 ?PT Time Calculation (min) (ACUTE ONLY): 24 min ? ?Charges:  $Gait Training: 23-37 mins          ?          ?Sallyanne Kuster, PTA, CLT ?Acute Rehab Services ?Office- [phone] ?12/27/21, 12:58 PM  ? ? ?Sallyanne Kuster ?12/27/2021, 12:57 PM ? ?

## 2021-12-27 NOTE — TOC Progression Note (Addendum)
Transition of Care (TOC) - Progression Note  ? ? ?Patient Details  ?Name: KAYAH HECKER ?MRN: [id number] ?Date of Birth: Jul 02, 1961 ? ?Transition of Care (TOC) CM/SW Contact  ?Kingsley Plan, RN ?Phone Number: ?12/27/2021, 11:18 AM ? ?Clinical Narrative:    ?Anticipated discharge date 12/28/21 . Debbie with [employer] aware . DME in patient's room.  ? ?Patient aware of above and will call family today , to arrange transportation home tomorrow  ?Expected Discharge Plan: Home w Home Health Services ?Barriers to Discharge: Continued Medical Work up ? ?Expected Discharge Plan and Services ?Expected Discharge Plan: Home w Home Health Services ?  ?Discharge Planning Services: CM Consult ?Post Acute Care Choice: Home Health ?Living arrangements for the past 2 months: Single Family Home ?                ?DME Arranged: Walker rolling ?DME Agency: [employer] ?  ?  ?  ?HH Arranged: PT ?HH Agency: [facility] ?Date HH Agency Contacted: 12/02/21 ?Time HH Agency Contacted: 1310 ?Representative spoke with at [facility] Agency: Amy ? ? ?Social Determinants of Health (SDOH) Interventions ?  ? ?Readmission Risk Interventions ?   ? View : No data to display.  ?  ?  ?  ? ? ?

## 2021-12-27 NOTE — Procedures (Signed)
Pre procedural Dx: Pelvic abscess ?Post procedural Dx: Same ? ?- Successful fluoroscopic guided drainage catheter exchange and repositioning. ?- Contrast injection demonstrates communication between the posterior component of the pelvic abscess and the large bore surgical drainage catheter located within the anterior component of the pelvic abscess.  ? ?EBL: Trace ?Complications: None immediate ? ?PLAN:  ?- Recommend flushing the percutaneous catheter with 10 cc of normal saline at least twice per day. ?- Maintain diligent records regarding daily drainage catheter output. ? ?Ronny Bacon, MD ?Pager #: [phone] ? ? ?

## 2021-12-27 NOTE — Progress Notes (Signed)
PT Cancellation Note ? ?Patient Details ?Name: Judith Miller ?MRN: [id number] ?DOB: 22-Feb-1961 ? ? ?Cancelled Treatment:    Reason Eval/Treat Not Completed: Patient declined, tired and just got comfortable after walking with mobility tech. Agreeable to PT and stair training for anticipated discharge home tomorrow later today as time allows vs next date. Acute PT to continue.  ? ?Judith Miller, PTA, CLT ?Acute Rehab Services ?Office- [phone] ?12/27/21, 11:53 AM  ? ? ?Judith Miller ?12/27/2021, 11:52 AM ?

## 2021-12-27 NOTE — Progress Notes (Signed)
Pharmacy Antibiotic Note ? ?NYDIA IORIO is a 61 y.o. female admitted on 11/27/2021 with  intra-abd abscesses .  Pharmacy has been consulted for Zosyn dosing. ? ?Pt on Zosyn 3/25-4/17 for intra-abd abscesses which were drained on 4/12. Repeat CT 4/18 shows enlarging R pelvic fluid collection and new small fluid collection inferior to the liver. Zosyn restarted 4/21. ? ?Likely home on po abx 4/25. ? ?Plan: ?Continue Zosyn 3.375gm IV q8h ?Will f/u clinical condition and renal function ? ?Height: 5\' 4"  (162.6 cm) ?Weight: 82.6 kg (182 lb) ?IBW/kg (Calculated) : 54.7 ? ?Temp (24hrs), Avg:98.2 ?F (36.8 ?C), Min:97.5 ?F (36.4 ?C), Max:99.4 ?F (37.4 ?C) ? ?Recent Labs  ?Lab 12/21/21 ?0502 12/22/21 ?[id number] 12/23/21 ?0405 12/24/21 ?0425 12/25/21 ?[id number] 12/26/21 ?0424  ?WBC 12.3* 16.4* 16.5* 14.5* 11.9* 10.6*  ?CREATININE <0.30* 0.37* 0.42* 0.41*  --   --   ? ?  ?Estimated Creatinine Clearance: 77.8 mL/min (A) (by C-G formula based on SCr of 0.41 mg/dL (L)).   ? ?Allergies  ?Allergen Reactions  ? Sulfa Antibiotics Other (See Comments)  ?  headache  ? ? ?Antimicrobials this admission: ?Zosyn 3/25 > 4/1, 4/5 >> 4/17; 4/21>> ? ?Microbiology results: ?3/26 abscess: mod Kleb, E.faecium, Pseudomonas, B.fragilis ?4/4 RLQ abscess - E.coli (panS), Kleb pneumo (panS), Strep constellatus (R emycin) ?4/12 abscess cx: Strep constellatus (R emycin), abundant bacteroides ? ?Thank you for allowing pharmacy to be a part of this patient?s care. ? ?Sherlon Handing, PharmD, BCPS ?Please see amion for complete clinical pharmacist phone list ?12/27/2021 1:00 PM ? ?

## 2021-12-27 NOTE — Progress Notes (Signed)
Mobility Specialist Progress Note: ? ? 12/27/21 1621  ?Mobility  ?Activity Ambulated with assistance in hallway  ?Level of Assistance Independent  ?Assistive Device None  ?Distance Ambulated (ft) 570 ft  ?Activity Response Tolerated well  ?$Mobility charge 1 Mobility  ? ?Pt received coming out of BR wanting to walk. Complaints of abdominal pain. Left in bed with call bell in reach and all needs met. ? ?Judith Miller ?Mobility Specialist ?Primary Phone [phone] ? ?

## 2021-12-27 NOTE — Progress Notes (Signed)
Occupational Therapy Treatment ?Patient Details ?Name: Judith Miller ?MRN: [id number] ?DOB: 1960/10/29 ?Today's Date: 12/27/2021 ? ? ?History of present illness 61 y.o. female presents to [facility] hospital 11/27/2021 as a transfer from [facility] with perforated sigmoid colon. Pt underwent CT guided pelvic drain placement on 3/26. 12/15/21 underwent ex lap with SBRand colostomy with placement of wound vac due to large abscess and persistent pelvic fluid collection.  PMH includes bipolar disorder. ?  ?OT comments ? This 61 yo female continues to make progress with abdominal pain still a factor. She is overall not at a setup/S level for basic ADLs. We did discuss how she can adjust her colostomy bag to empty it into toilet between her legs (had her practice sitting on toilet and showed her what I meant--she verbalized understanding). She also has the reacher to use if she does need it for LBD and if she drops something. She will continue to benefit from acute OT with follow up [place].  ? ?Recommendations for follow up therapy are one component of a multi-disciplinary discharge planning process, led by the attending physician.  Recommendations may be updated based on patient status, additional functional criteria and insurance authorization. ?   ?Follow Up Recommendations ? Home health OT  ?  ?Assistance Recommended at Discharge Intermittent Supervision/Assistance  ?Patient can return home with the following ? [name];[name];Assistance with cooking/housework ?  ?Equipment Recommendations ? BSC/3in1  ?  ?   ?Precautions / Restrictions Precautions ?Precautions: Fall ?Precaution Comments: JP drain x 2, new colostomy, wound vac, abdominal precautions for comfort ?Restrictions ?Weight Bearing Restrictions: No  ? ? ?  ? ?Mobility Bed Mobility ?Overal bed mobility: Needs Assistance ?Bed Mobility: Rolling, Sidelying to Sit ?Rolling: Supervision ?Sidelying to  sit: Min assist ?  ?  ?  ?  ?  ? ?Transfers ?Overall transfer level: Needs assistance ?Equipment used: None ?Transfers: Sit to/from Stand ?Sit to Stand: Supervision ?  ?  ?  ?  ?  ?  ?  ?  ?Balance Overall balance assessment: Needs assistance ?Sitting-balance support: No upper extremity supported, Feet supported ?Sitting balance-Leahy Scale: Good ?  ?  ?Standing balance support: No upper extremity supported ?Standing balance-Leahy Scale: Good ?  ?  ?  ?  ?  ?  ?  ?  ?  ?  ?  ?  ?   ? ?ADL either performed or assessed with clinical judgement  ? ?ADL Overall ADL's : Needs assistance/impaired ?Eating/Feeding: Independent;Sitting ?  ?Grooming: Wash/dry hands;Supervision/safety;Standing ?Grooming Details (indicate cue type and reason): used hand gel ?  ?  ?  ?  ?  ?  ?Lower Body Dressing: Set up;Sit to/from stand ?Lower Body Dressing Details (indicate cue type and reason): Pt continues to be able to cross one leg over the other at knee to get to her socks ?Toilet Transfer: Supervision/safety;Ambulation;Comfort height toilet ?  ?Toileting- Clothing Manipulation and Hygiene: Supervision/safety;Sit to/from stand ?  ?  ?  ?  ?  ?  ? ?Extremity/Trunk Assessment Upper Extremity Assessment ?Upper Extremity Assessment: Overall WFL for tasks assessed ?  ?  ?  ?  ?  ? ?Vision Patient Visual Report: No change from baseline ?  ?  ?   ?   ? ?Cognition Arousal/Alertness: Awake/alert ?Behavior During Therapy: [facility] for tasks assessed/performed ?Overall Cognitive Status: Within Functional Limits for tasks assessed ?  ?  ?  ?  ?  ?  ?  ?  ?  ?  ?  ?  ?  ?  ?  ?  ?  ?  ?  ?   ?   ?   ?   ? ? ?  Pertinent Vitals/ Pain       Pain Assessment ?Pain Assessment: Faces ?Faces Pain Scale: Hurts even more ?Pain Location: abdomen ?Pain Descriptors / Indicators: Grimacing, Guarding, Sore ?Pain Intervention(s): Monitored during session, Repositioned, Patient requesting pain meds-RN notified (not time for pain meds per RN) ? ?   ?   ? ?Frequency ? Min  2X/week  ? ? ? ? ?  ?Progress Toward Goals ? ?OT Goals(current goals can now be found in the care plan section) ? Progress towards OT goals: Progressing toward goals ? ?Acute Rehab OT Goals ?OT Goal Formulation: With patient ?Time For Goal Achievement: 12/31/21 ?Potential to Achieve Goals: Good  ?Plan Discharge plan remains appropriate   ? ?   ?AM-PAC OT "6 Clicks" Daily Activity     ?Outcome Measure ? ? Help from another person eating meals?: None ?Help from another person taking care of personal grooming?: A Little ?Help from another person toileting, which includes using toliet, bedpan, or urinal?: A Little ?Help from another person bathing (including washing, rinsing, drying)?: A Little ?Help from another person to put on and taking off regular upper body clothing?: A Little ?Help from another person to put on and taking off regular lower body clothing?: A Little ?6 Click Score: 19 ? ?  ?End of Session   ? ?OT Visit Diagnosis: Unsteadiness on feet (R26.81);Other abnormalities of gait and mobility (R26.89);Pain;Muscle weakness (generalized) (M62.81) ?Pain - part of body:  (abdomen) ?  ?Activity Tolerance Patient tolerated treatment well ?  ?Patient Left  (sitting on EOB getting ready to work with PT) ?  ?   ? ?   ? ?Time: [id number] ?OT Time Calculation (min): 29 min ? ?Charges: OT General Charges ?$OT Visit: 1 Visit ?OT Treatments ?$Self Care/Home Management : 23-37 mins ? ?[place], OTR/L ?Acute Rehab Services ?Pager [phone] ?Office [phone] ? ? ?Almon Register ?12/27/2021, 1:45 PM ?

## 2021-12-28 ENCOUNTER — Other Ambulatory Visit (HOSPITAL_COMMUNITY): Payer: Self-pay

## 2021-12-28 LAB — CBC
HCT: 23.2 % — ABNORMAL LOW (ref 36.0–46.0)
Hemoglobin: 7.2 g/dL — ABNORMAL LOW (ref 12.0–15.0)
MCH: 26.6 pg (ref 26.0–34.0)
MCHC: 31 g/dL (ref 30.0–36.0)
MCV: 85.6 fL (ref 80.0–100.0)
Platelets: 610 10*3/uL — ABNORMAL HIGH (ref 150–400)
RBC: 2.71 MIL/uL — ABNORMAL LOW (ref 3.87–5.11)
RDW: 15.9 % — ABNORMAL HIGH (ref 11.5–15.5)
WBC: 7.4 10*3/uL (ref 4.0–10.5)
nRBC: 0 % (ref 0.0–0.2)

## 2021-12-28 MED ORDER — SODIUM CHLORIDE 0.9% FLUSH
10.0000 mL | Freq: Every day | INTRAVENOUS | 1 refills | Status: AC
  Filled 2021-12-28 – 2022-08-19 (×2): qty 300, 30d supply, fill #0

## 2021-12-28 MED ORDER — CIPROFLOXACIN HCL 500 MG PO TABS: 500.0000 mg | ORAL_TABLET | Freq: Two times a day (BID) | ORAL | 0 refills | Status: AC

## 2021-12-28 MED ORDER — AMOXICILLIN-POT CLAVULANATE 875-125 MG PO TABS
1.0000 | ORAL_TABLET | Freq: Two times a day (BID) | ORAL | 0 refills | Status: DC
  Filled 2021-12-28: qty 20, 10d supply, fill #0

## 2021-12-28 MED ORDER — ACETAMINOPHEN 500 MG PO TABS
1000.0000 mg | ORAL_TABLET | Freq: Four times a day (QID) | ORAL | 0 refills | Status: DC
  Filled 2021-12-28: qty 30, 4d supply, fill #0

## 2021-12-28 MED ORDER — METHOCARBAMOL 500 MG PO TABS
500.0000 mg | ORAL_TABLET | Freq: Three times a day (TID) | ORAL | 0 refills | Status: DC | PRN
  Filled 2021-12-28: qty 40, 14d supply, fill #0

## 2021-12-28 MED ORDER — OXYCODONE HCL 10 MG PO TABS
10.0000 mg | ORAL_TABLET | Freq: Four times a day (QID) | ORAL | 0 refills | Status: DC | PRN
  Filled 2021-12-28: qty 30, 8d supply, fill #0

## 2021-12-28 MED ORDER — NICOTINE 21 MG/24HR TD PT24
21.0000 mg | MEDICATED_PATCH | Freq: Every day | TRANSDERMAL | 0 refills | Status: DC
Start: 2021-12-29 — End: 2022-09-09
  Filled 2021-12-28: qty 28, 28d supply, fill #0
  Filled 2021-12-28: qty 14, 14d supply, fill #0

## 2021-12-28 MED ORDER — METRONIDAZOLE 500 MG PO TABS: 500.0000 mg | ORAL_TABLET | Freq: Two times a day (BID) | ORAL | 0 refills | Status: AC

## 2021-12-28 NOTE — Progress Notes (Signed)
Physical Therapy Treatment & Discharge ?Patient Details ?Name: Judith Miller ?MRN: [id number] ?DOB: 11-08-1960 ?Today's Date: 12/28/2021 ? ? ?History of Present Illness Pt is a 61 y.o. female admitted 11/27/21 as transfer from [facility] with performated sigmoid colon. S/p pelvic drain placement 3/26. S/p ex lap with small bowel repair, colostomy and wound vac placement due to large abscess and persistent pelvic fluid collection on 4/12. PMH includes bipolar disorder. ?  ?PT Comments  ? ? Pt progressing with mobility; performing mobility and ADL tasks with supervision for safety. Reviewed educ, pt reports no further questions or concerns. Encouraged continued OOB ambulation with mobility specialists. Pt has met short-term acute PT goals. Will d/c acute PT.  ?   ?Recommendations for follow up therapy are one component of a multi-disciplinary discharge planning process, led by the attending physician.  Recommendations may be updated based on patient status, additional functional criteria and insurance authorization. ? ?Follow Up Recommendations ? Home health PT ?  ?  ?Assistance Recommended at Discharge Intermittent Supervision/Assistance  ?Patient can return home with the following [name];Assistance with cooking/housework;Assist for transportation;[name] ?  ?Equipment Recommendations ? None recommended by PT (RW/BSC delivered)  ?  ?Recommendations for Other Services   ? ? ?  ?Precautions / Restrictions Precautions ?Precautions: Fall;Other (comment) ?Precaution Comments: abdominal precautions for comfort; abdominal drains, colostomy, wound vac ?Restrictions ?Weight Bearing Restrictions: No  ?  ? ?Mobility ? Bed Mobility ?Overal bed mobility: Modified Independent ?  ?  ?  ?  ?  ?  ?General bed mobility comments: pt tolerating bed near flat, but adamantly declining therapist flattening bed fully ("I sleep propped up on pillows at home); pt also  declining log roll technique ("let me do it my way"), able to come to sitting EOB without assist or use of bed rail ?  ? ?Transfers ?Overall transfer level: Independent ?Equipment used: None ?Transfers: Sit to/from Stand ?  ?  ?  ?  ?  ?  ?General transfer comment: able to stand from EOB and low toilet height indep without DME; good awareness of lines ?  ? ?Ambulation/Gait ?Ambulation/Gait assistance: Supervision ?Gait Distance (Feet): 240 Feet ?Assistive device: None ?Gait Pattern/deviations: Step-through pattern, Decreased stride length ?Gait velocity: decreased ?  ?  ?General Gait Details: Slow, guarded gait without DME, supervision for safety, 1x self-corrected instability when opening curtain at doorway; pt declines further distance, reports, "let's make it a short walk, I'm hungry" ? ? ?Stairs ?Stairs:  (pt declined additional stair training, reports no concerns with stairs at home) ?  ?  ?  ?  ? ? ?Wheelchair Mobility ?  ? ?Modified Rankin (Stroke Patients Only) ?  ? ? ?  ?Balance Overall balance assessment: Needs assistance ?Sitting-balance support: No upper extremity supported, Feet supported ?Sitting balance-Leahy Scale: Good ?  ?  ?Standing balance support: No upper extremity supported, During functional activity ?Standing balance-Leahy Scale: Good ?  ?  ?  ?  ?  ?  ?  ?  ?  ?  ?  ?  ?  ? ?  ?Cognition Arousal/Alertness: Awake/alert ?Behavior During Therapy: [facility] for tasks assessed/performed, Flat affect ?Overall Cognitive Status: Within Functional Limits for tasks assessed ?  ?  ?  ?  ?  ?  ?  ?  ?  ?  ?  ?  ?  ?  ?  ?  ?  ?  ?  ? ?  ?Exercises   ? ?  ?  General Comments General comments (skin integrity, edema, etc.): reviewed educ re: abdominal precautions for comfort, drain/line management with mobility, activity recommendations, importance of mobility ?  ?  ? ?Pertinent Vitals/Pain Pain Assessment ?Pain Assessment: Faces ?Faces Pain Scale: Hurts even more ?Pain Location: abdomen ?Pain Descriptors /  Indicators: Grimacing, Guarding, Sore ?Pain Intervention(s): Monitored during session, Limited activity within patient's tolerance  ? ? ?Home Living   ?  ?  ?  ?  ?  ?  ?  ?  ?  ?   ?  ?Prior Function    ?  ?  ?   ? ?PT Goals (current goals can now be found in the care plan section) Progress towards PT goals: Goals met/education completed, patient discharged from PT ? ?  ?Frequency ? ? ? Min 3X/week ? ? ? ?  ?PT Plan Current plan remains appropriate  ? ? ?Co-evaluation   ?  ?  ?  ?  ? ?  ?AM-PAC PT "6 Clicks" Mobility   ?Outcome Measure ? Help needed turning from your back to your side while in a flat bed without using bedrails?: None ?Help needed moving from lying on your back to sitting on the side of a flat bed without using bedrails?: None ?Help needed moving to and from a bed to a chair (including a wheelchair)?: None ?Help needed standing up from a chair using your arms (e.g., wheelchair or bedside chair)?: None ?Help needed to walk in hospital room?: A Little ?Help needed climbing 3-5 steps with a railing? : A Little ?6 Click Score: 22 ? ?  ?End of Session   ?Activity Tolerance: Patient tolerated treatment well;Patient limited by pain ?Patient left: in bed;with call bell/phone within reach ?Nurse Communication: Mobility status ?PT Visit Diagnosis: Other abnormalities of gait and mobility (R26.89);Muscle weakness (generalized) (M62.81);Pain ?  ? ? ?Time: 3149-7026 ?PT Time Calculation (min) (ACUTE ONLY): 15 min ? ?Charges:  $Self Care/Home Management: 8-22          ?          ? ?Mabeline Caras, PT, DPT ?Acute Rehabilitation Services  ?Pager [phone] ?Office [phone] ? ?Derry Lory ?12/28/2021, 10:22 AM ? ?

## 2021-12-28 NOTE — Progress Notes (Signed)
RN gave patient and her daughter discharge instructions, and they both stated understanding. IV has been removed and PICC has been pulled. RN also showed patients daughter [name] how to flush her drain and empty both drains, as well as how to change to dressings on both drains. [name] stated understanding, home [facility] set up and hooked up for the patient. She is dressed waiting for pharmacy to drop off her prescriptions. Liz,PA added Nicotine patches for the patient RN spoke with Pharmacy and they stated they would fill it and bring it up. ?

## 2021-12-28 NOTE — Consult Note (Signed)
CCS requested WOC nurse see patient again today for pattern and cutting skin barrier despite the performance of this skill yesterday.  Seen patient, allowed to trace new skin barrier pattern and cut. She does have some minor difficulty with this but she will gain improvement with practice.  Assured her she was able to perform. Daughter across the room, will not be assisting with care.  ?Discussed supplies; ordered additional1pc flex and 2" skin barrier rings. Provided patient with [employer] medical contact number and explained they are the only company that takes [place] MCD, written on the back of the ostomy educational materials for patient.  ? ? ?Re consult if needed, will not follow at this time. ?Thanks ? Darin Redmann [facility] MSN, RN,CWOCN, CNS, CWON-AP [phone])  ?

## 2021-12-28 NOTE — Discharge Summary (Signed)
[facility] ?Discharge Summary  ? ?Patient ID: ?Duncan DullKaren F Fritzsche ?MRN: [id number] ?DOB/AGE: [birth year] 60 y.o. ? ?Admit date: 11/27/2021 ?Discharge date: 12/28/2021 ? ?Admitting Diagnosis: ?Diverticulitis  ? ?Discharge Diagnosis ?Patient Active Problem List  ? Diagnosis Date Noted  ? Perforation of sigmoid colon (HCC) 11/27/2021  ? ? ?Consultants ?Interventional radiology  ? ?Imaging: ?IR Catheter Tube Change ? ?Result Date: 12/27/2021 ?CLINICAL DATA:  History sigmoid diverticulitis complicated by development of multiple small bowel interloop abscesses, ultimately post exploratory laparotomy, small-bowel resection, drainage of intra-abdominal abscesses and diverting loop colostomy on 12/15/2021 (Dr. Freida BusmanAllen). While the patient has previously had multiple percutaneous drainage catheters, presently, she has only a solitary left trans gluteal approach percutaneous drainage catheter remains. The external portion of the drainage catheter is currently clogged and the drainage catheter was noted to be slightly retracted on abdominal CT performed 12/21/2021. As such, patient presents for image guided drainage catheter exchange and repositioning. EXAM: IR CATHETER TUBE CHANGE COMPARISON:  CT abdomen pelvis-12/21/2021; 12/14/2021; 12/05/2021 CONTRAST:  10 cc Omnipaque 300 - administered via the percutaneous drainage catheter. MEDICATIONS: None. ANESTHESIA/SEDATION: None FLUOROSCOPY TIME:  20 mGy TECHNIQUE: Patient was positioned right lateral decubitus on the fluoroscopy table. The external portion of the existing percutaneous drainage catheter as well as the surrounding skin was prepped and draped in usual sterile fashion. A preprocedural spot fluoroscopic image was obtained of the existing percutaneous drainage catheter. A small amount of contrast was injected via the existing percutaneous drainage catheter and several fluoroscopic images were obtained in various obliquities. The external portion of the percutaneous  drainage catheter was cut and cannulated with a short Amplatz wire. Under intermittent fluoroscopic guidance, the existing percutaneous drainage catheter was exchanged for a new 10 French percutaneous drainage catheter with end coiled and locked within the decompressed abscess cavity. Contrast injection confirmed appropriate position functionality of the percutaneous drainage catheter. The percutaneous drainage catheter was connected to a gravity bag and secured in place within interrupted suture and a StatLock device. A dressing was applied. The patient tolerated the procedure well without immediate postprocedural complication. FINDINGS: Preprocedural spot fluoroscopic image demonstrates retraction of the percutaneous catheter with radiopaque side marker at the level of the sacrum. Contrast injection demonstrates opacification of the decompressed abscess cavity with communication to the more anteriorly positioned large bore surgical drainage catheter Following fluoroscopic guided exchange, the new drainage catheter is more ideally positioned with end coiled and locked within the more central aspect of the posterior component of the pelvic abscess. IMPRESSION: 1. Successful fluoroscopic guided drainage catheter exchange and repositioning. 2. Contrast injection demonstrates communication between the posterior component of the pelvic abscess and the large bore surgical drainage catheter located within the anterior component of the pelvic abscess. PLAN: - Recommend flushing the percutaneous catheter with 10 cc of normal saline at least twice per day. - Maintain diligent records regarding daily drainage catheter output. Electronically Signed   By: Simonne ComeJohn  Watts M.D.   On: 12/27/2021 09:52   ? ?Procedures ?Dr. Sophronia SimasShelby Allen (12/15/21) - exploratory laparotomy, LOA, drainage of intra-abdominal abscesses, small bowel resection, diverting transverse loop colostomy  ? ?HPI:  ?[name] is a 61 yo female who presents as a  [facility] with a perforated sigmoid colon. She began having constipation and abdominal pain about 2 weeks ago. She started taking Miralax but the symptoms persisted. She denies any blood in her stool, and her last bowel movement was 3 days ago. She was first seen in the ED on  3/13 and diagnosed with pancreatitis because her lipase was mildly elevated at 120. She returned on 3/17 with ongoing abdominal pain and constipation and had an elevated WBC at that time. A CT scan was recommended but she declined to get a scan and was discharged home with a bowel regimen. She then returned to the ED yesterday with persistent pain. WBC was 41. A CT scan was done and showed multiple intraabdominal fluid collections, the largest of which was in pelvis adjacent to the sigmoid colon, with associated sigmoid thickening.  She also had dilation of the small bowel consistent with ileus.  The patient was evaluated by a surgeon at [facility] and percutaneous drainage of the abscess was recommended. Transfer to [facility] was requested for IR availability. ?  ?The patient has been waiting on a bed until this evening.  She was started on antibiotics at the referring facility and an NG tube was placed.  She is afebrile and hemodynamically stable on arrival.  She reports ongoing lower abdominal pain, but it is somewhat better with pain medications.  She denies any fevers, chills, or vomiting at home.  She has never had a colonoscopy and has no known family history of colon cancer. ?  ?COVID test was negative in the ED (reviewed in Care Everywhere). ? ?Hospital Course:  ?Patient admitted and started on IV abx. IR was consulted and placed 12 Fr abscess drain to the anterior pelvic collection, 10.2 Fr transgluteal drain into left posterior pelvic abscess on 3/26. Ileus improved and NG removed and patient started on liquid diet which was gradually advanced. Patient required a repeat CT 12/06/21 scan due to rising  leukocytosis and ongoing abdominal pain which showed a new, large right flank collection and the patient underwent placement of a 3rd IR drain on 4/4. Due to increased pain, increased WBC, and feculent drainage from IR drains CT was repeated 4/10. This was significant for large interloop abscess and persistent pelvic fluid collections. At this this point the decision was made to recommend operative intervention. The patient was taken to the OR for the above procedure where the colon was unable to be safely resected due to dense inflammatory tissues with no clear planes for dissection so she was diverted with a loop colostomy. Post-operatively NG tube was in place to LIWS until return of bowel function. She required TPN until she was able to tolerate PO. Diet advanced as tolerated. The patient worked with the wound ostomy nurses on colostomy care. Her midline wound was managed with a wound VAC. She mobilized with PT/OT. On 12/28/21 the patients pain was controlled, vitals stable, WBC WNL, IR drains functioning, having bowel function via colostomy, mobilizing, and felt stable for discharge home. Follow up as below with IR and general surgery. ? ?I have personally reviewed the patients medication history on the [place] controlled substance database. ? ? ?Physical Exam: ?General:  Alert, NAD, pleasant, comfortable ?Abd:  Soft, overall nontender, VAC to midline wound holding suction, drains in place draining purulence. Ostomy patent and productive of non-bloody stool.  ? ?Allergies as of 12/28/2021   ? ?   Reactions  ? Sulfa Antibiotics Other (See Comments)  ? headache  ? ?  ? ?  ?Medication List  ?  ? ?TAKE these medications   ? ?Acetaminophen Extra Strength 500 MG tablet ?Generic drug: acetaminophen ?Take 2 tablets (1,000 mg total) by mouth every 6 (six) hours. ?  ?amoxicillin-clavulanate 875-125 MG tablet ?Commonly known as: Augmentin ?Take 1 tablet by  mouth 2 (two) times daily for 10 days. ?  ?methocarbamol 500 MG  tablet ?Commonly known as: Robaxin ?Take 1 tablet (500 mg total) by mouth every 8 (eight) hours as needed for muscle spasms. ?  ?nicotine 21 mg/24hr patch ?Commonly known as: NICODERM CQ - dosed in mg/24 hours ?Place 1 patc

## 2021-12-28 NOTE — Care Management (Signed)
Dean at [facility] aware discharge is today. Discharge summary will need to be emailed to [facility] once complete  ? ?Spoke to patient and daughter via phone.  ?

## 2021-12-30 ENCOUNTER — Other Ambulatory Visit: Payer: Self-pay | Admitting: Surgery

## 2021-12-30 DIAGNOSIS — K631 Perforation of intestine (nontraumatic): Secondary | ICD-10-CM

## 2022-01-11 ENCOUNTER — Ambulatory Visit (HOSPITAL_COMMUNITY)
Admission: RE | Admit: 2022-01-11 | Discharge: 2022-01-11 | Disposition: A | Discharge status: Home / Self Care | Payer: Self-pay | Source: Ambulatory Visit | Attending: Surgery | Admitting: Surgery

## 2022-01-11 DIAGNOSIS — Z433 Encounter for attention to colostomy: Secondary | ICD-10-CM | POA: Insufficient documentation

## 2022-01-11 DIAGNOSIS — L24B1 Irritant contact dermatitis related to digestive stoma or fistula: Secondary | ICD-10-CM

## 2022-01-11 NOTE — Discharge Instructions (Signed)
COntinue 1 piece pouch ?Stoma powder and skin prep ?Add barrier ring ?Use pattern I provided.  ? ?Call [phone] ?

## 2022-01-11 NOTE — Progress Notes (Signed)
[facility]  ? ?Reason for visit:  ?LLQ colostomy with midline incision with NPWT (VAC) therapy.  MD removing VAC tomorrow at MD appointment, she reports.   ?She has not been performing her ostomy care, her son and daughter have been assisting her.  ?HPI:  ?Perforated diverticulum with LLQ colostomy ?ROS  ?Review of Systems  ?Constitutional:  Positive for fatigue.  ?Gastrointestinal:   ?     OPen wound to midline abdomen with VAC In place.  ?LLQ colostomy  ?Skin:  Positive for rash.  ?     Dermatitis to peristomal skin  ?All other systems reviewed and are negative. ?Vital signs:  ?BP 129/79 (BP Location: Right Arm)   Pulse 98   Temp 98.1 ?F (36.7 ?C) (Oral)   Resp 18   SpO2 95%  ?Exam:  ?Physical Exam ?Abdominal:  ?   Palpations: Abdomen is soft.  ?   Comments: VAC in place midline wound. Being removed tomorrow.   ?Skin: ?   Findings: Rash present.  ?Neurological:  ?   Mental Status: She is oriented to person, place, and time.  ?Psychiatric:     ?   Mood and Affect: Mood normal.  ?  ?Stoma type/location:  LLQ colostomy ?Stomal assessment/size:  oval  1.5 cm x 3 cm pink and moist slightly budded ?Peristomal assessment:  nonintact skin at 6:00 ?Treatment options for stomal/peristomal skin: will implement stoma powder and skin prep to protect ?Output: soft brown stool ?Ostomy pouching: 1pc. Convex with barrier ring, stoma powder and skin prep ?Education provided:  we perform pouch change today and I express importance for patient to be involved in her care.  She states she got a hand held mirror yesterday as her bathroom mirror did not work for her.  She expresses that she cannot cut the barrier.  Due to her oval stoma, she may not be able to get a pre-sized pouch.  She has not completed work towards the patient assistance program.  Since she has not filed taxes, they need a specific form she will not have until June.  I inform her that she needs to partner with [employer] on this or will be paying out  of pocket due to no insurance.  I will aid with supplies for awhile.  She states she has no money.    ? ?  ?Impression/dx  ?Colostomy ?Contact dermatitis ?Surgical wound, abdomen ?Discussion  ?See above ?Supply program ?Pouch change procedure ?Given pattern to cut barrier ?Given pouches, rings, powder, skin prep, gauze, drain sponges and tape today.  ?Plan  ?See back in 2 weeks.  Goal:  patient has gained independence in pouching.  ? ? ? ?Visit time: 50 minutes.  ? ?Blaise Palladino FNP-BC ? ?  ?

## 2022-01-13 ENCOUNTER — Other Ambulatory Visit: Payer: Self-pay

## 2022-01-24 ENCOUNTER — Encounter (HOSPITAL_COMMUNITY)
Admission: RE | Admit: 2022-01-24 | Discharge: 2022-01-24 | Disposition: A | Discharge status: Home / Self Care | Payer: Self-pay | Source: Ambulatory Visit | Attending: Nurse Practitioner | Admitting: Nurse Practitioner

## 2022-01-24 DIAGNOSIS — L24B3 Irritant contact dermatitis related to fecal or urinary stoma or fistula: Secondary | ICD-10-CM | POA: Insufficient documentation

## 2022-01-24 DIAGNOSIS — Z933 Colostomy status: Secondary | ICD-10-CM | POA: Insufficient documentation

## 2022-01-24 DIAGNOSIS — T8189XD Other complications of procedures, not elsewhere classified, subsequent encounter: Secondary | ICD-10-CM

## 2022-01-24 DIAGNOSIS — Z433 Encounter for attention to colostomy: Secondary | ICD-10-CM

## 2022-01-24 DIAGNOSIS — K5792 Diverticulitis of intestine, part unspecified, without perforation or abscess without bleeding: Secondary | ICD-10-CM | POA: Insufficient documentation

## 2022-01-24 NOTE — Progress Notes (Signed)
[facility]   Reason for visit:  LLQ colostomy HPI:  Diverticulitis ROS  Review of Systems Vital signs:  BP 139/89 (BP Location: Right Arm)   Pulse 95   Temp 97.6 F (36.4 C) (Oral)   Resp 18   SpO2 96%  Exam:  Physical Exam  Stoma type/location:  LLQ colostomy Stomal assessment/size:  oval 1.5 cm x 2.5 cm creasing at 3 and 9 o'clock Peristomal assessment:  creasing, midline wound Treatment options for stomal/peristomal skin: barrier ring and 1 piece convex pouch. Output: soft brown stool Ostomy pouching: 1pc. convex Education provided:  Patient states HH has utilized all approved visits.  Once they get an order, they will come out and remove VAC and teach NS moist gauze dressing.  Pump is gurgling and is leaking.  We will remove today and initiate NS moist gauze dressings.  Son is here and observes procedure. I am encouraging patient to perform more of her own care, wound and ostomy needs.  She is agreeable to try.  We remove the VAC and cleanse midline wound. I fill with fluffed NS     Impression/dx  *** Discussion  *** Plan  ***    Visit time: *** minutes.   Maple Hudson FNP-BC

## 2022-01-24 NOTE — Discharge Instructions (Signed)
Today we are removing the wound VAC Please let home health know We have reviewed how to cleanse wound, fill with saline moist gauze and top with dry gauze and tape.  I have provided supplies.  You can continue to get more comfortable changing pouch and performing wound care.  Follow up with Radiology 5/24 and surgery team 02/01/22

## 2022-01-26 ENCOUNTER — Encounter: Payer: Self-pay | Admitting: *Deleted

## 2022-01-26 ENCOUNTER — Ambulatory Visit
Admission: RE | Admit: 2022-01-26 | Discharge: 2022-01-26 | Disposition: A | Discharge status: Home / Self Care | Payer: Self-pay | Source: Ambulatory Visit | Attending: Surgery | Admitting: Surgery

## 2022-01-26 ENCOUNTER — Ambulatory Visit
Admission: RE | Admit: 2022-01-26 | Discharge: 2022-01-26 | Disposition: A | Discharge status: Home / Self Care | Payer: Self-pay | Source: Ambulatory Visit | Attending: Student | Admitting: Student

## 2022-01-26 DIAGNOSIS — K631 Perforation of intestine (nontraumatic): Secondary | ICD-10-CM

## 2022-01-26 HISTORY — PX: IR RADIOLOGIST EVAL & MGMT: IMG5224

## 2022-01-26 MED ORDER — IOPAMIDOL (ISOVUE-300) INJECTION 61%
100.0000 mL | Freq: Once | INTRAVENOUS | Status: AC | PRN
  Administered 2022-01-26: 100 mL via INTRAVENOUS

## 2022-01-26 NOTE — Progress Notes (Signed)
Referring Physician(s): Ansar Skoda R  Chief Complaint: The patient is seen in follow up today s/p perforated diverticulitis with multiple intra-abdominal fluid collections. Drain placements in IR 11/28/21 (2) and 12/07/21 (1).   History of present illness:  Judith Miller, 61 year old female, has a medical history significant for bipolar disorder. She presented to [facility] 11/26/21 with abdominal pain and was found to have significant leukocytosis and perforated diverticulitis with multiple intra-abdominal fluid collections. Imaging was also positive for an ileus. She was transferred to [facility] and was seen in IR 11/28/21 where she received two pelvic drains (RLQ and left TG) by Dr. Bryn Gulling. Her hospital course was complicated by ongoing abdominal pain and repeat imaging revealed an additional fluid collection. She was seen again in IR 12/07/21 and received a RUQ drain by Dr. Milford Cage.   She was taken to the OR 12/15/21 and underwent exploratory laparotomy with lysis of adhesions, drainage of the intra-abdominal abscesses, small bowel resection with primary reanastomosis and a diverting transverse loop colostomy. The RUQ and RLQ drains were removed during this procedure.   Still hospitalized, she was seen again in IR 12/27/21 for a catheter exchange/repositioning. She was discharged home 12/28/21 and she presents today to the [facility] Radiology outpatient clinic for drain evaluation with CT imaging and possible drain injection. She has been flushing the drain once daily with 10 ml NS and reports a daily output of 10 ml. She denies any fevers, pain, nausea/vomiting or diarrhea. She is not taking any antibiotics.   Past Medical History:  Diagnosis Date   Bipolar disorder (HCC)    Deviated septum     Past Surgical History:  Procedure Laterality Date   ABDOMINAL HYSTERECTOMY     BOWEL RESECTION N/A 12/15/2021   Procedure: SMALL BOWEL RESECTION;  Surgeon: Fritzi Mandes, MD;   Location: [facility] OR;  Service: General;  Laterality: N/A;   IR CATHETER TUBE CHANGE  12/27/2021   KNEE ARTHROSCOPY Left    LAPAROTOMY N/A 12/15/2021   Procedure: EXPLORATORY LAPAROTOMY;  Surgeon: Fritzi Mandes, MD;  Location: [facility] OR;  Service: General;  Laterality: N/A;   TRANSVERSE LOOP COLOSTOMY N/A 12/15/2021   Procedure: TRANSVERSE LOOP COLOSTOMY;  Surgeon: Fritzi Mandes, MD;  Location: [facility] OR;  Service: General;  Laterality: N/A;   UPPER GI ENDOSCOPY      Allergies: Sulfa antibiotics  Medications: Prior to Admission medications   Medication Sig Start Date End Date Taking? Authorizing Provider  acetaminophen (TYLENOL) 500 MG tablet Take 2 tablets (1,000 mg total) by mouth every 6 (six) hours. 12/28/21   Adam Phenix, PA-C  methocarbamol (ROBAXIN) 500 MG tablet Take 1 tablet (500 mg total) by mouth every 8 (eight) hours as needed for muscle spasms. 12/28/21   Adam Phenix, PA-C  nicotine (NICODERM CQ - DOSED IN MG/24 HOURS) 21 mg/24hr patch Place 1 patch (21 mg total) onto the skin daily. 12/29/21   Adam Phenix, PA-C  Oxycodone HCl 10 MG TABS Take 1 tablet (10 mg total) by mouth every 6 (six) hours as needed (5mg  for moderate pain, 10mg  for severe pain). 12/28/21   , PA-C  risperiDONE (RISPERDAL) 0.5 MG tablet Take 0.5 mg by mouth in the morning.    [provider]  risperiDONE (RISPERDAL) 1 MG tablet Take 1.5 mg by mouth at bedtime.    [provider]  sodium chloride flush (NS) 0.9 % SOLN 10 mLs by Intracatheter route daily. Flush catheter with 10 ml  NS once daily. 12/28/21 02/26/22  Mickie Kay, NP  vitamin B-12 (CYANOCOBALAMIN) 500 MCG tablet Take 500 mcg by mouth daily.    [provider]     Family History  Problem Relation Age of Onset   Colon cancer Neg Hx     Social History   Socioeconomic History   Marital status: Married    Spouse name: Not on file   Number of children: Not on file   Years of education:  Not on file   Highest education level: Not on file  Occupational History   Not on file  Tobacco Use   Smoking status: Every Day    Packs/day: 0.50    Years: 40.00    Pack years: 20.00    Types: Cigarettes   Smokeless tobacco: Never  Vaping Use   Vaping Use: Never used  Substance and Sexual Activity   Alcohol use: Never   Drug use: Never   Sexual activity: Not on file  Other Topics Concern   Not on file  Social History Narrative   Not on file   Social Determinants of Health   Financial Resource Strain: Not on file  Food Insecurity: Not on file  Transportation Needs: Not on file  Physical Activity: Not on file  Stress: Not on file  Social Connections: Not on file     Vital Signs: There were no vitals taken for this visit.  Physical Exam Constitutional:      General: She is not in acute distress.    Appearance: She is not ill-appearing.  Pulmonary:     Effort: Pulmonary effort is normal.  Abdominal:     Comments: Colostomy. Left transgluteal drain to gravity. Approximately 10 ml of tan fluid in bag. Skin insertion site unremarkable.   Skin:    General: Skin is warm and dry.  Neurological:     Mental Status: She is alert and oriented to person, place, and time.    Imaging: No results found.  Labs:  CBC: Recent Labs    12/24/21 0425 12/25/21 0431 12/26/21 0424 12/28/21 0418  WBC 14.5* 11.9* 10.6* 7.4  HGB 7.7* 7.3* 7.1* 7.2*  HCT 24.3* 23.2* 22.9* 23.2*  PLT 582* 582* 552* 610*    COAGS: Recent Labs    11/28/21 1054  INR 1.2    BMP: Recent Labs    12/21/21 0502 12/22/21 0349 12/23/21 0405 12/24/21 0425  NA 132* 135 131* 133*  K 4.5 4.3 3.8 3.6  CL 97* 101 97* 99  CO2 28 27 26 28   GLUCOSE 115* 99 105* 97  BUN 14 11 14 18   CALCIUM 8.2* 8.5* 8.7* 9.1  CREATININE <0.30* 0.37* 0.42* 0.41*  GFRNONAA NOT CALCULATED >60 >60 >60    LIVER FUNCTION TESTS: Recent Labs    12/16/21 0540 12/19/21 0303 12/20/21 0341  BILITOT 0.3  --  0.4   AST 15  --  21  ALT 16  --  20  ALKPHOS 52  --  65  PROT 5.5*  --  5.7*  ALBUMIN <1.5* 1.6* 1.6*    Assessment:  Perforated diverticulitis with multiple intra-abdominal fluid collections. Drain placements in IR 11/28/21 (RLQ and left TG) and 12/07/21 (RUQ). The right upper and lower quadrant drains were removed during Surgery on 12/15/21.  CT imaging shows complete resolution of the intra-abdominal abscesses. Given the low daily output and the patient's overall well-being the decision was made to remove the left TG drain. The drain was easily removed and the  patient tolerated this well. The site was covered with gauze/tape. The patient was instructed to wait one day before allowing the site to get wet and one week before submerging the site in water. She knows to call her PCP or go to the ED if she develops fevers, chills, abdominal pain, nausea/vomiting or diarrhea.   She has a follow up appointment with Dr. Freida BusmanAllen 02/01/22 and she was encouraged to keep this appointment. She knows she can call our clinic with any questions/concerns related to today's drain removal.   Signed: Mickie KayJamie R Gene Colee, NP 01/26/2022, 2:00 PM   Please refer to Dr. Kenna GilbertWagner's attestation of this note for management and plan.

## 2022-02-11 ENCOUNTER — Other Ambulatory Visit (HOSPITAL_COMMUNITY): Payer: Self-pay | Admitting: Surgery

## 2022-02-11 ENCOUNTER — Ambulatory Visit (HOSPITAL_COMMUNITY)
Admission: RE | Admit: 2022-02-11 | Discharge: 2022-02-11 | Disposition: A | Discharge status: Home / Self Care | Payer: Self-pay | Source: Ambulatory Visit | Attending: Surgery | Admitting: Surgery

## 2022-02-11 ENCOUNTER — Other Ambulatory Visit: Payer: Self-pay | Admitting: Surgery

## 2022-02-11 DIAGNOSIS — K5720 Diverticulitis of large intestine with perforation and abscess without bleeding: Secondary | ICD-10-CM

## 2022-02-11 MED ORDER — IOHEXOL 300 MG/ML  SOLN
100.0000 mL | Freq: Once | INTRAMUSCULAR | Status: AC | PRN
  Administered 2022-02-11: 100 mL via INTRAVENOUS

## 2022-02-11 MED ORDER — IOHEXOL 9 MG/ML PO SOLN
1000.0000 mL | ORAL | Status: AC
  Administered 2022-02-11: 1000 mL via ORAL

## 2022-03-11 ENCOUNTER — Encounter (HOSPITAL_COMMUNITY): Payer: Self-pay | Admitting: Nurse Practitioner

## 2022-03-18 ENCOUNTER — Encounter (HOSPITAL_COMMUNITY): Payer: Self-pay | Admitting: Nurse Practitioner

## 2022-03-22 ENCOUNTER — Encounter (HOSPITAL_COMMUNITY): Payer: Self-pay

## 2022-03-22 ENCOUNTER — Ambulatory Visit (HOSPITAL_COMMUNITY)
Admission: RE | Admit: 2022-03-22 | Discharge: 2022-03-22 | Disposition: A | Discharge status: Home / Self Care | Payer: 59 | Source: Ambulatory Visit | Attending: Surgery | Admitting: Surgery

## 2022-03-22 ENCOUNTER — Other Ambulatory Visit: Payer: Self-pay | Admitting: Surgery

## 2022-03-22 ENCOUNTER — Other Ambulatory Visit (HOSPITAL_COMMUNITY): Payer: Self-pay | Admitting: Surgery

## 2022-03-22 DIAGNOSIS — Z933 Colostomy status: Secondary | ICD-10-CM | POA: Diagnosis not present

## 2022-03-22 DIAGNOSIS — Z978 Presence of other specified devices: Secondary | ICD-10-CM | POA: Diagnosis not present

## 2022-03-22 DIAGNOSIS — K651 Peritoneal abscess: Secondary | ICD-10-CM | POA: Diagnosis not present

## 2022-03-22 DIAGNOSIS — K5720 Diverticulitis of large intestine with perforation and abscess without bleeding: Secondary | ICD-10-CM

## 2022-03-22 DIAGNOSIS — K632 Fistula of intestine: Secondary | ICD-10-CM | POA: Diagnosis not present

## 2022-03-22 DIAGNOSIS — I700 Atherosclerosis of aorta: Secondary | ICD-10-CM | POA: Diagnosis not present

## 2022-03-22 DIAGNOSIS — N739 Female pelvic inflammatory disease, unspecified: Secondary | ICD-10-CM | POA: Diagnosis not present

## 2022-03-22 MED ORDER — IOHEXOL 300 MG/ML  SOLN
100.0000 mL | Freq: Once | INTRAMUSCULAR | Status: AC | PRN
  Administered 2022-03-22: 100 mL via INTRAVENOUS

## 2022-03-23 ENCOUNTER — Observation Stay (HOSPITAL_COMMUNITY)
Admission: AD | Admit: 2022-03-23 | Discharge: 2022-03-25 | Disposition: A | Discharge status: Home / Self Care | Payer: 59 | Source: Ambulatory Visit | Attending: Surgery | Admitting: Surgery

## 2022-03-23 ENCOUNTER — Encounter (HOSPITAL_COMMUNITY): Payer: Self-pay | Admitting: Surgery

## 2022-03-23 ENCOUNTER — Other Ambulatory Visit: Payer: Self-pay

## 2022-03-23 DIAGNOSIS — K651 Peritoneal abscess: Secondary | ICD-10-CM | POA: Insufficient documentation

## 2022-03-23 DIAGNOSIS — I700 Atherosclerosis of aorta: Secondary | ICD-10-CM | POA: Insufficient documentation

## 2022-03-23 DIAGNOSIS — Z978 Presence of other specified devices: Secondary | ICD-10-CM | POA: Insufficient documentation

## 2022-03-23 DIAGNOSIS — N739 Female pelvic inflammatory disease, unspecified: Secondary | ICD-10-CM

## 2022-03-23 DIAGNOSIS — K632 Fistula of intestine: Secondary | ICD-10-CM | POA: Diagnosis not present

## 2022-03-23 DIAGNOSIS — Z933 Colostomy status: Secondary | ICD-10-CM | POA: Insufficient documentation

## 2022-03-23 HISTORY — DX: Female pelvic inflammatory disease, unspecified: N73.9

## 2022-03-23 LAB — CBC
HCT: 43 % (ref 36.0–46.0)
Hemoglobin: 13.7 g/dL (ref 12.0–15.0)
MCH: 28.8 pg (ref 26.0–34.0)
MCHC: 31.9 g/dL (ref 30.0–36.0)
MCV: 90.3 fL (ref 80.0–100.0)
Platelets: 365 10*3/uL (ref 150–400)
RBC: 4.76 MIL/uL (ref 3.87–5.11)
RDW: 14.7 % (ref 11.5–15.5)
WBC: 12.1 10*3/uL — ABNORMAL HIGH (ref 4.0–10.5)
nRBC: 0 % (ref 0.0–0.2)

## 2022-03-23 LAB — BASIC METABOLIC PANEL
Anion gap: 7 (ref 5–15)
BUN: 16 mg/dL (ref 6–20)
CO2: 27 mmol/L (ref 22–32)
Calcium: 9.6 mg/dL (ref 8.9–10.3)
Chloride: 105 mmol/L (ref 98–111)
Creatinine, Ser: 0.73 mg/dL (ref 0.44–1.00)
GFR, Estimated: >60 mL/min (ref >60)
Glucose, Bld: 86 mg/dL (ref 70–99)
Potassium: 4 mmol/L (ref 3.5–5.1)
Sodium: 139 mmol/L (ref 135–145)

## 2022-03-23 LAB — PROTIME-INR
INR: 1 (ref 0.8–1.2)
Prothrombin Time: 12.9 seconds (ref 11.4–15.2)

## 2022-03-23 MED ORDER — NICOTINE 21 MG/24HR TD PT24
21.0000 mg | MEDICATED_PATCH | Freq: Every day | TRANSDERMAL | Status: DC
  Administered 2022-03-23 – 2022-03-25 (×3): 21 mg via TRANSDERMAL
  Filled 2022-03-23 (×3): qty 1

## 2022-03-23 MED ORDER — METHOCARBAMOL 1000 MG/10ML IJ SOLN: 500.0000 mg | Freq: Three times a day (TID) | INTRAVENOUS | Status: DC | PRN

## 2022-03-23 MED ORDER — ENOXAPARIN SODIUM 40 MG/0.4ML IJ SOSY
40.0000 mg | PREFILLED_SYRINGE | INTRAMUSCULAR | Status: DC
  Administered 2022-03-23 – 2022-03-24 (×2): 40 mg via SUBCUTANEOUS
  Filled 2022-03-23 (×2): qty 0.4

## 2022-03-23 MED ORDER — ONDANSETRON HCL 4 MG/2ML IJ SOLN: 4.0000 mg | Freq: Four times a day (QID) | INTRAMUSCULAR | Status: DC | PRN

## 2022-03-23 MED ORDER — METOPROLOL TARTRATE 5 MG/5ML IV SOLN: 5.0000 mg | Freq: Four times a day (QID) | INTRAVENOUS | Status: DC | PRN

## 2022-03-23 MED ORDER — RISPERIDONE 1 MG PO TABS
1.5000 mg | ORAL_TABLET | Freq: Every day | ORAL | Status: DC
  Administered 2022-03-23 – 2022-03-24 (×2): 1.5 mg via ORAL
  Filled 2022-03-23 (×2): qty 2

## 2022-03-23 MED ORDER — ONDANSETRON 4 MG PO TBDP: 4.0000 mg | ORAL_TABLET | Freq: Four times a day (QID) | ORAL | Status: DC | PRN

## 2022-03-23 MED ORDER — METHOCARBAMOL 500 MG PO TABS
500.0000 mg | ORAL_TABLET | Freq: Three times a day (TID) | ORAL | Status: DC | PRN
  Administered 2022-03-24 – 2022-03-25 (×2): 500 mg via ORAL
  Filled 2022-03-23 (×2): qty 1

## 2022-03-23 MED ORDER — RISPERIDONE 0.25 MG PO TABS
0.5000 mg | ORAL_TABLET | Freq: Every day | ORAL | Status: DC
  Administered 2022-03-24 – 2022-03-25 (×2): 0.5 mg via ORAL
  Filled 2022-03-23 (×2): qty 2

## 2022-03-23 MED ORDER — ACETAMINOPHEN 325 MG PO TABS
650.0000 mg | ORAL_TABLET | Freq: Four times a day (QID) | ORAL | Status: DC | PRN
  Administered 2022-03-24 – 2022-03-25 (×3): 650 mg via ORAL
  Filled 2022-03-23 (×3): qty 2

## 2022-03-23 MED ORDER — TRAMADOL HCL 50 MG PO TABS
50.0000 mg | ORAL_TABLET | Freq: Four times a day (QID) | ORAL | Status: DC | PRN
  Administered 2022-03-23 – 2022-03-25 (×4): 50 mg via ORAL
  Filled 2022-03-23 (×4): qty 1

## 2022-03-23 NOTE — H&P (Signed)
HPI: Ms. Yung is a 61 yo female who presented to clinic yesterday for ongoing follow up of complicated diverticulitis.  Briefly, she had an ex lap, small bowel resection, and transverse loop colostomy for perforated sigmoid diverticulitis on 12/15/21, after failing to improve with non-operative management and percutaneous drain placement. 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 She says she continues to have drainage from the gluteal wound, and changes the dressing daily. She also has a lot of pelvic pain and pressure that gets worse with standing. She has been limited with the hours she can work because of the pain, and is requesting a prescription for pain medication to take while at work. She denies fevers and is eating well. Her midline wound has closed.  She was sent for a CT scan yesterday from clinic that showed a persistent abscess in the pelvis with slight enlargement. She was direct admitted for percutaneous drain placement.       Physical Exam Constitutional:      Appearance: Normal appearance.  Pulmonary:     Effort: Pulmonary effort is normal. No respiratory distress.  Abdominal:     General: There is no distension.     Palpations: Abdomen is soft.     Tenderness: There is no abdominal tenderness.     Comments: Midline incision has healed. LLQ colostomy productive of stool. Prior left gluteal drain site has purulent drainage on dressing, surrounding skin is in tact.  Skin:    General: Skin is warm and dry.  Neurological:     General: No focal deficit present.     Mental Status: She is alert and oriented to person, place, and time.          Assessment and Plan:    HAN LYSNE is a 61 y.o. female who underwent exploratory laparotomy, small bowel resection, and diverting transverse loop colostomy on 12/15/21 for complicated perforated diverticulitis.  She had a prolonged postoperative course but is recovering.  Her midline  incision has now closed and her colostomy is functioning well.  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  Because she is having worsening pain and persistent drainage with slight enlargement of the abscess, will consult IR for replacement of a percutaneous drain. - Regular diet, NPO midnight - PRN tylenol and tramadol for pain - Labs pending (CBC, BMP, coags) - VTE: lovenox (to be given tonight), SCDs - Dispo: admit to observation  Sophronia Simas, MD [facility] Surgery General, Hepatobiliary and Pancreatic Surgery 03/23/22 5:19 PM

## 2022-03-24 ENCOUNTER — Inpatient Hospital Stay (HOSPITAL_COMMUNITY): Payer: 59

## 2022-03-24 DIAGNOSIS — K632 Fistula of intestine: Secondary | ICD-10-CM | POA: Diagnosis not present

## 2022-03-24 DIAGNOSIS — K651 Peritoneal abscess: Secondary | ICD-10-CM | POA: Diagnosis not present

## 2022-03-24 DIAGNOSIS — Z933 Colostomy status: Secondary | ICD-10-CM | POA: Diagnosis not present

## 2022-03-24 DIAGNOSIS — Z4682 Encounter for fitting and adjustment of non-vascular catheter: Secondary | ICD-10-CM | POA: Diagnosis not present

## 2022-03-24 DIAGNOSIS — N739 Female pelvic inflammatory disease, unspecified: Secondary | ICD-10-CM | POA: Diagnosis not present

## 2022-03-24 HISTORY — PX: IR CATHETER TUBE CHANGE: IMG717

## 2022-03-24 MED ORDER — SODIUM CHLORIDE 0.9% FLUSH
5.0000 mL | Freq: Three times a day (TID) | INTRAVENOUS | Status: DC
  Administered 2022-03-24 – 2022-03-25 (×2): 5 mL

## 2022-03-24 MED ORDER — FENTANYL CITRATE (PF) 100 MCG/2ML IJ SOLN
INTRAMUSCULAR | Status: AC | PRN
  Administered 2022-03-24: 50 ug via INTRAVENOUS

## 2022-03-24 MED ORDER — FENTANYL CITRATE (PF) 100 MCG/2ML IJ SOLN
INTRAMUSCULAR | Status: AC
  Filled 2022-03-24: qty 2

## 2022-03-24 MED ORDER — MIDAZOLAM HCL 2 MG/2ML IJ SOLN
INTRAMUSCULAR | Status: AC | PRN
  Administered 2022-03-24: 1 mg via INTRAVENOUS

## 2022-03-24 MED ORDER — CEFTRIAXONE SODIUM 2 G IJ SOLR
2.0000 g | INTRAMUSCULAR | Status: DC
  Administered 2022-03-24 – 2022-03-25 (×2): 2 g via INTRAVENOUS
  Filled 2022-03-24 (×2): qty 20

## 2022-03-24 MED ORDER — LIDOCAINE HCL 1 % IJ SOLN
INTRAMUSCULAR | Status: AC | PRN
  Administered 2022-03-24: 10 mL via INTRADERMAL

## 2022-03-24 MED ORDER — LIDOCAINE HCL 1 % IJ SOLN
INTRAMUSCULAR | Status: AC
  Filled 2022-03-24: qty 20

## 2022-03-24 MED ORDER — IOHEXOL 300 MG/ML  SOLN
50.0000 mL | Freq: Once | INTRAMUSCULAR | Status: AC | PRN
Start: 2022-03-24 — End: 2022-03-24
  Administered 2022-03-24: 10 mL

## 2022-03-24 MED ORDER — IBUPROFEN 400 MG PO TABS: 400.0000 mg | ORAL_TABLET | Freq: Four times a day (QID) | ORAL | Status: DC | PRN

## 2022-03-24 MED ORDER — MIDAZOLAM HCL 2 MG/2ML IJ SOLN
INTRAMUSCULAR | Status: AC
  Filled 2022-03-24: qty 2

## 2022-03-24 NOTE — Consult Note (Signed)
Chief Complaint: Patient was seen in consultation today for pelvic abscess at the request of Dr. Freida Busman  Referring Physician(s): Dr. Sophronia Simas  Supervising Physician: Ruel Favors  Patient Status: [facility] - In-pt  History of Present Illness: Judith Miller is a 61 y.o. female with a history of complicated diverticulitis with perforation.  She is 3 months s/p exploratory lap, washout, small bowel resection, and diverting transverse loop colostomy.  She had a pelvic abscess thereafter that was managed with a percutaneous drain placed by IR.  CT on 01/26/22 revealed resolution of abscess and drain was removed.  She was admitted with persistent drainage from the drain wound tract and imaging demonstrating recurrence of abscess.  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  IR consulted for placement of percutaneous drain into pelvic abscess. [name] endorses constant tenderness at the site of the prior transgluteal drain and admits to periodic malodorous drainage and bleeding.  She recognizes when standing that she has suprapubic pain and pressure.  Past Medical History:  Diagnosis Date   Bipolar disorder (HCC)    Deviated septum     Past Surgical History:  Procedure Laterality Date   ABDOMINAL HYSTERECTOMY     BOWEL RESECTION N/A 12/15/2021   Procedure: SMALL BOWEL RESECTION;  Surgeon: Fritzi Mandes, MD;  Location: [facility] OR;  Service: General;  Laterality: N/A;   IR CATHETER TUBE CHANGE  12/27/2021   IR RADIOLOGIST EVAL & MGMT  01/26/2022   KNEE ARTHROSCOPY Left    LAPAROTOMY N/A 12/15/2021   Procedure: EXPLORATORY LAPAROTOMY;  Surgeon: Fritzi Mandes, MD;  Location: [facility] OR;  Service: General;  Laterality: N/A;   TRANSVERSE LOOP COLOSTOMY N/A 12/15/2021   Procedure: TRANSVERSE LOOP COLOSTOMY;  Surgeon: Fritzi Mandes, MD;  Location: [facility] OR;  Service: General;  Laterality: N/A;   UPPER GI ENDOSCOPY      Allergies: Other and Sulfa antibiotics  Medications: Prior to  Admission medications   Medication Sig Start Date End Date Taking? Authorizing Provider  acetaminophen (TYLENOL) 500 MG tablet Take 2 tablets (1,000 mg total) by mouth every 6 (six) hours. Patient taking differently: Take 500-1,000 mg by mouth every 6 (six) hours as needed (for pain). 12/28/21  Yes Adam Phenix, PA-C  Coenzyme Q10 (CO Q-10) 100 MG CAPS Take 100 mg by mouth in the morning.   Yes [provider]  COLLAGEN PO Take 1 capsule by mouth daily.   Yes [provider]  Magnesium 250 MG TABS Take 250 mg by mouth in the morning.   Yes [provider]  Multiple Vitamin (MULTIVITAMIN) tablet Take 1 tablet by mouth daily.   Yes [provider]  nicotine (NICODERM CQ - DOSED IN MG/24 HOURS) 21 mg/24hr patch Place 1 patch (21 mg total) onto the skin daily. 12/29/21  Yes Adam Phenix, PA-C  Potassium 99 MG TABS Take 99 mg by mouth daily.   Yes [provider]  risperiDONE (RISPERDAL) 1 MG tablet Take 0.5-1.5 mg by mouth See admin instructions. Take 0.5 mg by mouth in the morning and 1.5 mg at bedtime   Yes [provider]  vitamin B-12 (CYANOCOBALAMIN) 500 MCG tablet Take 500 mcg by mouth daily.   Yes [provider]  methocarbamol (ROBAXIN) 500 MG tablet Take 1 tablet (500 mg total) by mouth every 8 (eight) hours as needed for muscle spasms. Patient not taking: Reported on 03/23/2022 12/28/21   Adam Phenix, PA-C  risperiDONE (RISPERDAL) 0.5 MG tablet Take  0.5 mg by mouth in the morning. Patient not taking: Reported on 03/23/2022    [provider]     Family History  Problem Relation Age of Onset   Colon cancer Neg Hx     Social History   Socioeconomic History   Marital status: Married    Spouse name: Not on file   Number of children: Not on file   Years of education: Not on file   Highest education level: Not on file  Occupational History   Not on file  Tobacco Use   Smoking status: Every Day     Packs/day: 0.50    Years: 40.00    Total pack years: 20.00    Types: Cigarettes   Smokeless tobacco: Never  Vaping Use   Vaping Use: Never used  Substance and Sexual Activity   Alcohol use: Never   Drug use: Never   Sexual activity: Not on file  Other Topics Concern   Not on file  Social History Narrative   Not on file   Social Determinants of Health   Financial Resource Strain: Not on file  Food Insecurity: Not on file  Transportation Needs: Not on file  Physical Activity: Not on file  Stress: Not on file  Social Connections: Not on file    Review of Systems: A 12 point ROS discussed and pertinent positives are indicated in the HPI above.  All other systems are negative.  Vital Signs: BP 104/74 (BP Location: Right Arm)   Pulse 80   Temp 97.7 F (36.5 C) (Oral)   Resp 17   SpO2 94%   Physical Exam Vitals reviewed.  Constitutional:      Appearance: Normal appearance.  HENT:     Mouth/Throat:     Mouth: Mucous membranes are moist.     Pharynx: Oropharynx is clear.  Eyes:     Extraocular Movements: Extraocular movements intact.     Conjunctiva/sclera: Conjunctivae normal.  Cardiovascular:     Rate and Rhythm: Normal rate and regular rhythm.     Pulses: Normal pulses.     Heart sounds: Normal heart sounds.  Pulmonary:     Effort: Pulmonary effort is normal.     Breath sounds: Normal breath sounds.  Abdominal:     Palpations: Abdomen is soft.     Tenderness: There is abdominal tenderness.  Skin:    General: Skin is dry.  Neurological:     General: No focal deficit present.     Mental Status: She is alert and oriented to person, place, and time.  Psychiatric:        Mood and Affect: Mood normal.        Behavior: Behavior normal.     Imaging: CT ABDOMEN PELVIS W CONTRAST  Result Date: 03/22/2022 CLINICAL DATA:  Intermittent abdominal pain EXAM: CT ABDOMEN AND PELVIS WITH CONTRAST TECHNIQUE: Multidetector CT imaging of the abdomen and pelvis was  performed using the standard protocol following bolus administration of intravenous contrast. RADIATION DOSE REDUCTION: This exam was performed according to the departmental dose-optimization program which includes automated exposure control, adjustment of the mA and/or kV according to patient size and/or use of iterative reconstruction technique. CONTRAST:  OMNIPAQUE IOHEXOL 300 MG/ML  SOLN COMPARISON:  02/11/2022 FINDINGS: Lower chest: No acute abnormality Hepatobiliary: No focal hepatic abnormality. Gallbladder unremarkable. Pancreas: No focal abnormality or ductal dilatation. Spleen: No focal abnormality.  Normal size. Adrenals/Urinary Tract: No acute abnormality. Stomach/Bowel: Extensive diverticulosis changes in the sigmoid colon. Continued gas  and fluid collection within the left pelvis adjacent to the sigmoid colon and uterus measuring 5.3 x 2.8 cm compared to 4.5 x 3.5 cm previously. Along the posterior aspect of this gas and fluid collection is a tract through the left gluteal muscles related to prior transgluteal drainage catheter. Enhancement of the tract noted throughout the gluteal muscles, but no definite fluid within the tract. Loop colostomy noted in the left abdomen containing the transverse colon. Large duodenal diverticulum again noted. No evidence of bowel obstruction. Vascular/Lymphatic: Aortic atherosclerosis. No evidence of aneurysm or adenopathy. Reproductive: Uterus and adnexa unremarkable.  No mass. Other: No free fluid or free air. Musculoskeletal: No acute bony abnormality. IMPRESSION: Continued left pelvic gas and fluid collection associated with an enhancing tract through the left gluteal muscles related to prior drainage. Overall size of the left pelvic gas and fluid collection similar to prior study. Extensive diverticulosis changes in the sigmoid colon. Loop colostomy involving the transverse colon. No change since prior study or evidence of bowel obstruction. Aortic  atherosclerosis. Electronically Signed   By: Charlett Nose M.D.   On: 03/22/2022 14:23    Labs:  CBC: Recent Labs    12/25/21 0431 12/26/21 0424 12/28/21 0418 03/23/22 1726  WBC 11.9* 10.6* 7.4 12.1*  HGB 7.3* 7.1* 7.2* 13.7  HCT 23.2* 22.9* 23.2* 43.0  PLT 582* 552* 610* 365    COAGS: Recent Labs    11/28/21 1054 03/23/22 1726  INR 1.2 1.0    BMP: Recent Labs    12/22/21 0349 12/23/21 0405 12/24/21 0425 03/23/22 1726  NA 135 131* 133* 139  K 4.3 3.8 3.6 4.0  CL 101 97* 99 105  CO2 27 26 28 27   GLUCOSE 99 105* 97 86  BUN 11 14 18 16   CALCIUM 8.5* 8.7* 9.1 9.6  CREATININE 0.37* 0.42* 0.41* 0.73  GFRNONAA >60 >60 >60 >60    LIVER FUNCTION TESTS: Recent Labs    12/16/21 0540 12/19/21 0303 12/20/21 0341  BILITOT 0.3  --  0.4  AST 15  --  21  ALT 16  --  20  ALKPHOS 52  --  65  PROT 5.5*  --  5.7*  ALBUMIN <1.5* 1.6* 1.6*    Assessment and Plan:  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  All of the patient's questions were answered, patient is agreeable to proceed. Consent signed and in chart.   Thank you for this interesting consult.  I greatly enjoyed meeting EMALEE KNIES and look forward to participating in their care.  A copy of this report was sent to the requesting provider on this date.  Electronically Signed: 12/22/21, PA 03/24/2022, 2:39 PM   I spent a total of 20 Minutes in face to face in clinical consultation, greater than 50% of which was counseling/coordinating care for pelvic abscess

## 2022-03-24 NOTE — Progress Notes (Signed)
Subjective: No acute changes. Afebrile.   Objective: Vital signs in last 24 hours: Temp:  [97.8 F (36.6 C)-98.7 F (37.1 C)] 97.8 F (36.6 C) (07/20 0506) Pulse Rate:  [79-84] 79 (07/20 0506) Resp:  [16-18] 18 (07/20 0506) BP: (111-120)/(61-75) 116/68 (07/20 0506) SpO2:  [93 %-97 %] 97 % (07/20 0506) Last BM Date : 03/23/22  Intake/Output from previous day: 07/19 0701 - 07/20 0700 In: 480 [P.O.:480] Out: -  Intake/Output this shift: No intake/output data recorded.  PE: General: resting comfortably, NAD Neuro: alert and oriented, no focal deficits Resp: normal work of breathing on room air Abdomen: soft, nondistended, nontender to palpation. Midline incision healed. LUQ colostomy. Extremities: warm and well-perfused   Lab Results:  Recent Labs    03/23/22 1726  WBC 12.1*  HGB 13.7  HCT 43.0  PLT 365   BMET Recent Labs    03/23/22 1726  NA 139  K 4.0  CL 105  CO2 27  GLUCOSE 86  BUN 16  CREATININE 0.73  CALCIUM 9.6   PT/INR Recent Labs    03/23/22 1726  LABPROT 12.9  INR 1.0   CMP     Component Value Date/Time   NA 139 03/23/2022 1726   K 4.0 03/23/2022 1726   CL 105 03/23/2022 1726   CO2 27 03/23/2022 1726   GLUCOSE 86 03/23/2022 1726   BUN 16 03/23/2022 1726   CREATININE 0.73 03/23/2022 1726   CALCIUM 9.6 03/23/2022 1726   PROT 5.7 (L) 12/20/2021 0341   ALBUMIN 1.6 (L) 12/20/2021 0341   AST 21 12/20/2021 0341   ALT 20 12/20/2021 0341   ALKPHOS 65 12/20/2021 0341   BILITOT 0.4 12/20/2021 0341   GFRNONAA >60 03/23/2022 1726   GFRAA  02/27/2010 2026    >60        The eGFR has been calculated using the MDRD equation. This calculation has not been validated in all clinical situations. eGFR's persistently <60 mL/min signify possible Chronic Kidney Disease.   Lipase  No results found for: "LIPASE"     Studies/Results: CT ABDOMEN PELVIS W CONTRAST  Result Date: 03/22/2022 CLINICAL DATA:  Intermittent abdominal pain  EXAM: CT ABDOMEN AND PELVIS WITH CONTRAST TECHNIQUE: Multidetector CT imaging of the abdomen and pelvis was performed using the standard protocol following bolus administration of intravenous contrast. RADIATION DOSE REDUCTION: This exam was performed according to the departmental dose-optimization program which includes automated exposure control, adjustment of the mA and/or kV according to patient size and/or use of iterative reconstruction technique. CONTRAST:  131mL OMNIPAQUE IOHEXOL 300 MG/ML  SOLN COMPARISON:  02/11/2022 FINDINGS: Lower chest: No acute abnormality Hepatobiliary: No focal hepatic abnormality. Gallbladder unremarkable. Pancreas: No focal abnormality or ductal dilatation. Spleen: No focal abnormality.  Normal size. Adrenals/Urinary Tract: No acute abnormality. Stomach/Bowel: Extensive diverticulosis changes in the sigmoid colon. Continued gas and fluid collection within the left pelvis adjacent to the sigmoid colon and uterus measuring 5.3 x 2.8 cm compared to 4.5 x 3.5 cm previously. Along the posterior aspect of this gas and fluid collection is a tract through the left gluteal muscles related to prior transgluteal drainage catheter. Enhancement of the tract noted throughout the gluteal muscles, but no definite fluid within the tract. Loop colostomy noted in the left abdomen containing the transverse colon. Large duodenal diverticulum again noted. No evidence of bowel obstruction. Vascular/Lymphatic: Aortic atherosclerosis. No evidence of aneurysm or adenopathy. Reproductive: Uterus and adnexa unremarkable.  No mass. Other: No free fluid  or free air. Musculoskeletal: No acute bony abnormality. IMPRESSION: Continued left pelvic gas and fluid collection associated with an enhancing tract through the left gluteal muscles related to prior drainage. Overall size of the left pelvic gas and fluid collection similar to prior study. Extensive diverticulosis changes in the sigmoid colon. Loop colostomy  involving the transverse colon. No change since prior study or evidence of bowel obstruction. Aortic atherosclerosis. Electronically Signed   By: Rolm Baptise M.D.   On: 03/22/2022 14:23    Anti-infectives: Anti-infectives (From admission, onward)    Start     Dose/Rate Route Frequency Ordered Stop   03/24/22 0915  cefTRIAXone (ROCEPHIN) 2 g in sodium chloride 0.9 % 100 mL IVPB        2 g 200 mL/hr over 30 Minutes Intravenous Every 24 hours 03/24/22 0821          Assessment/Plan 61 yo female with a history of complicated diverticulitis with perforation, now 3 months s/p exploratory laparotomy, washout, small bowel resection, and diverting transverse loop colostomy. She now has a recurrent pelvic abscess, possible secondary to a fistula from the sigmoid colon. - IR consult placed for placement of a percutaneous drain - Will start Rocephin based on sensitivities from previous abscess cultures - Regular diet after drain placement - VTE: lovenox, SCDs - Dispo: observation    LOS: 0 days    Michaelle Birks, MD [facility] Surgery General, Hepatobiliary and Pancreatic Surgery 03/24/22 8:22 AM

## 2022-03-24 NOTE — Procedures (Signed)
Interventional Radiology Procedure Note  Procedure: FLUORO TRANSGLUTEAL PLEVIC ABSCESS DRAIN REPLACEMENT  Complications: None  Estimated Blood Loss:  MIN  Findings: 10FR DRAIN REPLACED FISTULA TO BOWEL NOTED    Sharen Counter, MD

## 2022-03-25 ENCOUNTER — Other Ambulatory Visit (HOSPITAL_COMMUNITY): Payer: Self-pay | Admitting: Radiology

## 2022-03-25 DIAGNOSIS — K631 Perforation of intestine (nontraumatic): Secondary | ICD-10-CM

## 2022-03-25 DIAGNOSIS — K632 Fistula of intestine: Secondary | ICD-10-CM | POA: Diagnosis not present

## 2022-03-25 DIAGNOSIS — N739 Female pelvic inflammatory disease, unspecified: Secondary | ICD-10-CM | POA: Diagnosis not present

## 2022-03-25 MED ORDER — AMOXICILLIN-POT CLAVULANATE 875-125 MG PO TABS: 1.0000 | ORAL_TABLET | Freq: Two times a day (BID) | ORAL | 0 refills | Status: AC

## 2022-03-25 MED ORDER — TRAMADOL HCL 50 MG PO TABS: 50.0000 mg | ORAL_TABLET | Freq: Two times a day (BID) | ORAL | 0 refills | Status: DC | PRN

## 2022-03-25 MED ORDER — TERBINAFINE HCL 1 % EX CREA: 1.0000 | TOPICAL_CREAM | Freq: Two times a day (BID) | CUTANEOUS | 1 refills | Status: DC

## 2022-03-25 MED ORDER — SODIUM CHLORIDE 0.9% FLUSH: 10.0000 mL | Freq: Every day | INTRAVENOUS | 2 refills | Status: AC

## 2022-03-25 MED ORDER — AMOXICILLIN-POT CLAVULANATE 875-125 MG PO TABS: 1.0000 | ORAL_TABLET | Freq: Two times a day (BID) | ORAL | 0 refills | Status: DC

## 2022-03-25 NOTE — Progress Notes (Addendum)
General Surgery  Transgluteal drain replaced yesterday into recurrent pelvic abscess. 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 Drain output low this morning. Patient is feeling well with no acute complaints. Will discharge home today with close outpatient follow up. She has asked about pain medication, I discussed I will prescribe her a few days of tramadol to help with pain at new drain, but our office will not providing any refills after this supply. We have previously discussed the risks of long-term opioid use. I have also discussed smoking cessation with her, as nicotine use inhibits wound healing and puts her at further risk of complications.  Sophronia Simas, MD [facility] Surgery General, Hepatobiliary and Pancreatic Surgery 03/25/22 7:11 AM

## 2022-03-25 NOTE — Discharge Summary (Signed)
Physician Discharge Summary   Patient ID: Judith Miller [id number] y.o. Jun 04, 1961  Admit date: 03/23/2022  Discharge date and time: 03/25/2022 11:02 AM   Admitting Physician: Fritzi Mandes, MD   Discharge Physician: Sophronia Simas  Admission Diagnoses: Pelvic abscess in female [N73.9]  Discharge Diagnoses: Recurrent pelvic abscess  Admission Condition: fair  Discharged Condition: fair  Indication for Admission: Judith Miller is a 61 yo female with a history of complicated diverticulitis, for which she previously underwent exploratory laparotomy, small bowel resection, and diverting transverse loop colostomy in April 2023. She presented to clinic with worsening pelvic pain and pressure, and was found to have a recurrent pelvic abscess on imaging.  Hospital Course: The patient was directly admitted to [facility] from home for placement of a percutaneous drain. She was started on IV Rocephin based on sensitivities from previous drain cultures. IR was consulted and on 7/20 placed a new transgluteal drain into the abscess. Fistulation to the colon and small bowel were noted. The day after drain placement, the patient's overall drain output was low, with no large-volume enteric contents in the drain. She was feeling well and hemodynamically stable. She was examined and deemed appropriate for discharge home with outpatient follow up. Her drain will remain in place until follow up. She will complete a week of oral antibiotics at discharge.  Consults:  IR  Significant Diagnostic Studies: N/A  Treatments: antibiotics: ceftriaxone and procedures: percutaneous drainage catheter placement  Discharge Exam: General: resting comfortably, NAD Neuro: alert and oriented, no focal deficits Resp: normal work of breathing on room air Abdomen: soft, nondistended, nontender to palpation. Midline incision well-healed. LLQ colostomy productive of stool. Transgluteal drain productive of  seropurulent fluid. Extremities: warm and well-perfused   Disposition: Discharge disposition: 01-Home or Self Care       Patient Instructions:  Allergies as of 03/25/2022       Reactions   Other Other (See Comments)   Diverticulitis- NO FOODS WITH SEEDS, NUTS, POPCORN!!   Sulfa Antibiotics Other (See Comments)   Headaches        Medication List     STOP taking these medications    methocarbamol 500 MG tablet Commonly known as: Robaxin       TAKE these medications    Acetaminophen Extra Strength 500 MG tablet Generic drug: acetaminophen Take 2 tablets (1,000 mg total) by mouth every 6 (six) hours. What changed:  how much to take when to take this reasons to take this   amoxicillin-clavulanate 875-125 MG tablet Commonly known as: AUGMENTIN Take 1 tablet by mouth 2 (two) times daily for 6 days.   Co Q-10 100 MG Caps Take 100 mg by mouth in the morning.   COLLAGEN PO Take 1 capsule by mouth daily.   Magnesium 250 MG Tabs Take 250 mg by mouth in the morning.   multivitamin tablet Take 1 tablet by mouth daily.   nicotine 21 mg/24hr patch Commonly known as: NICODERM CQ - dosed in mg/24 hours Place 1 patch (21 mg total) onto the skin daily.   Potassium 99 MG Tabs Take 99 mg by mouth daily.   risperiDONE 0.5 MG tablet Commonly known as: RISPERDAL Take 0.5 mg by mouth in the morning.   risperiDONE 1 MG tablet Commonly known as: RISPERDAL Take 0.5-1.5 mg by mouth See admin instructions. Take 0.5 mg by mouth in the morning and 1.5 mg at bedtime   sodium chloride flush 0.9 % Soln Commonly known as: NS 10  mLs by Intracatheter route daily.   terbinafine 1 % cream Commonly known as: LAMISIL Apply 1 Application topically 2 (two) times daily. Apply to affected toe.   traMADol 50 MG tablet Commonly known as: ULTRAM Take 1 tablet (50 mg total) by mouth every 12 (twelve) hours as needed for severe pain.   vitamin B-12 500 MCG tablet Commonly known as:  CYANOCOBALAMIN Take 500 mcg by mouth daily.       Activity: no driving while on analgesics Diet: regular diet Wound Care:  Flush drain daily with 30mL sterile saline  Follow-up with Dr. Freida Busman in 2 weeks.  Signed: Fritzi Mandes 03/25/2022 2:11 PM

## 2022-03-25 NOTE — Discharge Instructions (Signed)
[facility] [place] [facility] DISCHARGE INSTRUCTIONS  Activity Do not drive while taking narcotic pain medication.  Drain Care Flush your drain daily with 10 mL sterile saline. Do not submerge your drain underwater. Monitor your incision for any new redness, tenderness, or drainage.  When to Call us: Fever greater than 100.5 New redness, drainage, or swelling at incision site Severe pain, nausea, or vomiting  Follow-up You will have an appointment with Dr. Freida Busman in 2-3 weeks to check your drain. This will be at the [facility] Surgery office at [address], [place], [place]. Please arrive at least 15 minutes prior to your scheduled appointment time.  For questions or concerns, please call the office at [phone].

## 2022-03-25 NOTE — Progress Notes (Signed)
Nurse reviewed discharge instructions with pt.  Pt verbalized understanding of discharge instructions, follow up appointments and new medications.  Pt stated that her daughter and son will be helping her with her drain flushes at home and they both know how to perform task. Pt wants to wait downstairs for her daughter who will be picking her up.

## 2022-03-29 ENCOUNTER — Encounter (HOSPITAL_COMMUNITY): Payer: Self-pay | Admitting: Nurse Practitioner

## 2022-03-30 ENCOUNTER — Other Ambulatory Visit: Payer: Self-pay | Admitting: Surgery

## 2022-03-30 DIAGNOSIS — K631 Perforation of intestine (nontraumatic): Secondary | ICD-10-CM

## 2022-03-31 ENCOUNTER — Other Ambulatory Visit (HOSPITAL_COMMUNITY): Payer: Self-pay

## 2022-03-31 MED ORDER — NORMAL SALINE FLUSH 0.9 % IV SOLN
INTRAVENOUS | 2 refills | Status: AC
  Filled 2022-03-31: qty 300, 30d supply, fill #0

## 2022-04-08 ENCOUNTER — Other Ambulatory Visit: Payer: 59

## 2022-04-09 ENCOUNTER — Emergency Department (HOSPITAL_COMMUNITY)
Admission: EM | Admit: 2022-04-09 | Discharge: 2022-04-09 | Disposition: A | Discharge status: Home / Self Care | Payer: 59 | Attending: Student | Admitting: Student

## 2022-04-09 ENCOUNTER — Encounter (HOSPITAL_COMMUNITY): Payer: Self-pay

## 2022-04-09 ENCOUNTER — Emergency Department (HOSPITAL_COMMUNITY): Payer: 59

## 2022-04-09 DIAGNOSIS — K9400 Colostomy complication, unspecified: Secondary | ICD-10-CM | POA: Insufficient documentation

## 2022-04-09 DIAGNOSIS — Z433 Encounter for attention to colostomy: Secondary | ICD-10-CM | POA: Diagnosis not present

## 2022-04-09 DIAGNOSIS — M25511 Pain in right shoulder: Secondary | ICD-10-CM | POA: Diagnosis not present

## 2022-04-09 NOTE — ED Triage Notes (Signed)
Pt states that her stoma is popped out more than normal. Stoma is pink, pt states it is starting to be irritated. Pt states that while at [facility], they did not have her size wafer and it has gotten worse since then.

## 2022-04-09 NOTE — Discharge Instructions (Signed)
I strongly recommend that you reach out to your colorectal surgery team that performed your procedure in order to get resources for a larger colostomy bag.  Return if development of any new or worsening symptoms.

## 2022-04-09 NOTE — ED Provider Notes (Signed)
[facility] EMERGENCY DEPARTMENT Provider Note   CSN: [id number] Arrival date & time: 04/09/22  1449     History  Chief Complaint  Patient presents with   Stoma Issue    Judith Miller is a 61 y.o. female.  Patient with history of sigmoid colon perforation status post colectomy and colostomy placement presents today with stoma issue.  She states that she was recently admitted to [facility] long and while she was there they did not have the appropriate sized wafer and it caused her stoma to protrude more from her abdomen than normal.  She states that given this her current wafers at home do not fit and she is unable to get any larger ones until Monday.  Denies any abdominal pain, pain in the stoma, surrounding skin irritation, fevers, or chills.  No nausea or vomiting.  The history is provided by the patient. No language interpreter was used.       Home Medications Prior to Admission medications   Medication Sig Start Date End Date Taking? Authorizing Provider  acetaminophen (TYLENOL) 500 MG tablet Take 2 tablets (1,000 mg total) by mouth every 6 (six) hours. Patient taking differently: Take 500-1,000 mg by mouth every 6 (six) hours as needed (for pain). 12/28/21   Adam Phenix, PA-C  Coenzyme Q10 (CO Q-10) 100 MG CAPS Take 100 mg by mouth in the morning.    [provider]  COLLAGEN PO Take 1 capsule by mouth daily.    [provider]  Magnesium 250 MG TABS Take 250 mg by mouth in the morning.    [provider]  Multiple Vitamin (MULTIVITAMIN) tablet Take 1 tablet by mouth daily.    [provider]  nicotine (NICODERM CQ - DOSED IN MG/24 HOURS) 21 mg/24hr patch Place 1 patch (21 mg total) onto the skin daily. 12/29/21   Adam Phenix, PA-C  Potassium 99 MG TABS Take 99 mg by mouth daily.    [provider]  risperiDONE (RISPERDAL) 0.5 MG tablet Take 0.5 mg by mouth in the morning. Patient not taking: Reported on 03/23/2022     [provider]  risperiDONE (RISPERDAL) 1 MG tablet Take 0.5-1.5 mg by mouth See admin instructions. Take 0.5 mg by mouth in the morning and 1.5 mg at bedtime    [provider]  Sodium Chloride Flush (NORMAL SALINE FLUSH) 0.9 % SOLN Inject 10 MLs by intracatheter daily 03/31/22     sodium chloride flush (NS) 0.9 % SOLN 10 mLs by Intracatheter route daily. 03/25/22 06/23/22  Fritzi Mandes, MD  terbinafine (LAMISIL) 1 % cream Apply 1 Application topically 2 (two) times daily. Apply to affected toe. 03/25/22   Juliet Rude, PA-C  traMADol (ULTRAM) 50 MG tablet Take 1 tablet (50 mg total) by mouth every 12 (twelve) hours as needed for severe pain. 03/25/22   Juliet Rude, PA-C  vitamin B-12 (CYANOCOBALAMIN) 500 MCG tablet Take 500 mcg by mouth daily.    [provider]      Allergies    Other and Sulfa antibiotics    Review of Systems   Review of Systems  All other systems reviewed and are negative.   Physical Exam Updated Vital Signs BP 127/81 (BP Location: Right Arm)   Pulse 87   Temp 98.3 F (36.8 C) (Oral)   Resp 18   Ht 5\' 3"  (1.6 m)   Wt 71.9 kg   SpO2 96%   BMI 28.07 kg/m  Physical  Exam Vitals and nursing note reviewed.  Constitutional:      General: She is not in acute distress.    Appearance: Normal appearance. She is normal weight. She is not ill-appearing, toxic-appearing or diaphoretic.  HENT:     Head: Normocephalic and atraumatic.  Cardiovascular:     Rate and Rhythm: Normal rate.  Pulmonary:     Effort: Pulmonary effort is normal. No respiratory distress.  Abdominal:     General: Abdomen is flat.     Palpations: Abdomen is soft.     Comments: Stoma present in left lower quadrant of the abdomen is pink and soft and light brown soft stool is draining appropriately.  Current colostomy bag does not appropriately surround the stoma and therefore stool is getting on the skin, however does not appear that there is any skin irritation  present at this time.  Abdomen is soft and nontender.  Musculoskeletal:        General: Normal range of motion.     Cervical back: Normal range of motion.     Comments: Mild tenderness to palpation of the right humeral head  Skin:    General: Skin is warm and dry.  Neurological:     General: No focal deficit present.     Mental Status: She is alert.  Psychiatric:        Mood and Affect: Mood normal.        Behavior: Behavior normal.     ED Results / Procedures / Treatments   Labs (all labs ordered are listed, but only abnormal results are displayed) Labs Reviewed - No data to display  EKG None  Radiology DG Shoulder Right  Result Date: 04/09/2022 CLINICAL DATA:  Right shoulder pain EXAM: RIGHT SHOULDER - 2+ VIEW COMPARISON:  None Available. FINDINGS: Frontal, transscapular, and axillary views of the right shoulder are obtained. No acute displaced fracture, subluxation, or dislocation. 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 Soft tissues are unremarkable. Visualized portions of the right chest are clear. IMPRESSION: 1. Mild osteoarthritis.  No acute bony abnormality. Electronically Signed   By: Sharlet Salina M.D.   On: 04/09/2022 16:22    Procedures Procedures    Medications Ordered in ED Medications - No data to display  ED Course/ Medical Decision Making/ A&P                           Medical Decision Making Amount and/or Complexity of Data Reviewed Radiology: ordered.   Patient presents today with concerns for stoma issue.  She is afebrile, nontoxic-appearing, and in no acute distress with reassuring vital signs.  Stoma visualized through the colostomy bag is pink and soft without any surrounding skin irritation, erythema, or purulence. No concern for infection or necrosis. Her abdomen is soft and nontender.  The bag that she is currently wearing does not appropriately fit and stool is getting on the skin, however unfortunately we do not  have the appropriately sized colostomy bag for her.  She does have 2 bags in her possession at this time that are too large but will fit over her stoma.  I recommend that she use these until Monday when she can follow-up with her colorectal surgery team who can give her resources to get a larger bag that appropriately fits.  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  She requested an  x-ray for same which I ordered.  X-ray was negative for any acute abnormalities.  I personally reviewed and interpreted this imaging and agree with radiology interpretation.  Recommend that she take Tylenol and ibuprofen for this and rest, ice, compress, and elevate the areas that are painful.    No further emergent concerns at this time, she is stable for discharge. Patient is understanding and amenable with plan.  Educated on red flag symptoms that would prompt immediate return.  Discharged in stable condition.  Findings and plan of care discussed with supervising physician Dr. Posey Rea who is in agreement.    Final Clinical Impression(s) / ED Diagnoses Final diagnoses:  Colostomy complication (HCC)  Acute pain of right shoulder    Rx / DC Orders ED Discharge Orders     None     An After Visit Summary was printed and given to the patient.     Vear Clock 04/09/22 1723    Glendora Score, MD 04/12/22 [phone]

## 2022-04-14 ENCOUNTER — Ambulatory Visit
Admission: RE | Admit: 2022-04-14 | Discharge: 2022-04-14 | Disposition: A | Discharge status: Home / Self Care | Payer: 59 | Source: Ambulatory Visit | Attending: Radiology | Admitting: Radiology

## 2022-04-14 ENCOUNTER — Ambulatory Visit
Admission: RE | Admit: 2022-04-14 | Discharge: 2022-04-14 | Disposition: A | Discharge status: Home / Self Care | Payer: 59 | Source: Ambulatory Visit | Attending: Surgery | Admitting: Surgery

## 2022-04-14 ENCOUNTER — Encounter: Payer: Self-pay | Admitting: Radiology

## 2022-04-14 DIAGNOSIS — K631 Perforation of intestine (nontraumatic): Secondary | ICD-10-CM

## 2022-04-14 DIAGNOSIS — K5780 Diverticulitis of intestine, part unspecified, with perforation and abscess without bleeding: Secondary | ICD-10-CM | POA: Diagnosis not present

## 2022-04-14 DIAGNOSIS — K632 Fistula of intestine: Secondary | ICD-10-CM | POA: Diagnosis not present

## 2022-04-14 DIAGNOSIS — K5720 Diverticulitis of large intestine with perforation and abscess without bleeding: Secondary | ICD-10-CM | POA: Diagnosis not present

## 2022-04-14 HISTORY — PX: IR RADIOLOGIST EVAL & MGMT: IMG5224

## 2022-04-14 MED ORDER — IOPAMIDOL (ISOVUE-300) INJECTION 61%
100.0000 mL | Freq: Once | INTRAVENOUS | Status: AC | PRN
  Administered 2022-04-14: 100 mL via INTRAVENOUS

## 2022-04-14 NOTE — Progress Notes (Signed)
Referring Physician(s): Dr Eliot Ford  Chief Complaint: The patient is seen in follow up today s/p perforated sigmoid colon abscess. Drain placed in IR originally 4/4. Subsequent removal. Recurrent abscess-- Replacement in IR with Dr Miles Costain 03/24/22  History of present illness:  Pt scheduled today for CT evaluation and drain injection of drain OP ~100 cc every 3 days Flushes once daily - 10 cc sterile saline  Denies fever or chills Painful to touch at site  Follows with [facility] Has appt to see Dr Freida Busman 8/28. Finished antibiotics several days ago    Past Medical History:  Diagnosis Date   Bipolar disorder (HCC)    Deviated septum     Past Surgical History:  Procedure Laterality Date   ABDOMINAL HYSTERECTOMY     BOWEL RESECTION N/A 12/15/2021   Procedure: SMALL BOWEL RESECTION;  Surgeon: Fritzi Mandes, MD;  Location[facility] OR;  Service: General;  Laterality: N/A;   IR CATHETER TUBE CHANGE  12/27/2021   IR CATHETER TUBE CHANGE  03/24/2022   IR RADIOLOGIST EVAL & MGMT  01/26/2022   KNEE ARTHROSCOPY Left    LAPAROTOMY N/A 12/15/2021   Procedure: EXPLORATORY LAPAROTOMY;  Surgeon: Fritzi Mandes, MD;  Location: [facility] OR;  Service: General;  Laterality: N/A;   TRANSVERSE LOOP COLOSTOMY N/A 12/15/2021   Procedure: TRANSVERSE LOOP COLOSTOMY;  Surgeon: Fritzi Mandes, MD;  Location: [facility] OR;  Service: General;  Laterality: N/A;   UPPER GI ENDOSCOPY      Allergies: Other and Sulfa antibiotics  Medications: Prior to Admission medications   Medication Sig Start Date End Date Taking? Authorizing Provider  acetaminophen (TYLENOL) 500 MG tablet Take 2 tablets (1,000 mg total) by mouth every 6 (six) hours. Patient taking differently: Take 500-1,000 mg by mouth every 6 (six) hours as needed (for pain). 12/28/21   Adam Phenix, PA-C  Coenzyme Q10 (CO Q-10) 100 MG CAPS Take 100 mg by mouth in the morning.    [provider]  COLLAGEN PO Take 1 capsule by mouth daily.    [provider]  Magnesium 250 MG TABS Take 250 mg by mouth in the morning.    [provider]  Multiple Vitamin (MULTIVITAMIN) tablet Take 1 tablet by mouth daily.    [provider]  nicotine (NICODERM CQ - DOSED IN MG/24 HOURS) 21 mg/24hr patch Place 1 patch (21 mg total) onto the skin daily. 12/29/21   Adam Phenix, PA-C  Potassium 99 MG TABS Take 99 mg by mouth daily.    [provider]  risperiDONE (RISPERDAL) 0.5 MG tablet Take 0.5 mg by mouth in the morning. Patient not taking: Reported on 03/23/2022    [provider]  risperiDONE (RISPERDAL) 1 MG tablet Take 0.5-1.5 mg by mouth See admin instructions. Take 0.5 mg by mouth in the morning and 1.5 mg at bedtime    [provider]  Sodium Chloride Flush (NORMAL SALINE FLUSH) 0.9 % SOLN Inject 10 MLs by intracatheter daily 03/31/22     sodium chloride flush (NS) 0.9 % SOLN 10 mLs by Intracatheter route daily. 03/25/22 06/23/22  Fritzi Mandes, MD  terbinafine (LAMISIL) 1 % cream Apply 1 Application topically 2 (two) times daily. Apply to affected toe. 03/25/22   Juliet Rude, PA-C  traMADol (ULTRAM) 50 MG tablet Take 1 tablet (50 mg total) by mouth every 12 (twelve) hours as needed for severe pain. 03/25/22   Juliet Rude, PA-C  vitamin B-12 (CYANOCOBALAMIN) 500 MCG  tablet Take 500 mcg by mouth daily.    [provider]     Family History  Problem Relation Age of Onset   Colon cancer Neg Hx     Social History   Socioeconomic History   Marital status: Married    Spouse name: Not on file   Number of children: Not on file   Years of education: Not on file   Highest education level: Not on file  Occupational History   Not on file  Tobacco Use   Smoking status: Every Day    Packs/day: 0.50    Years: 40.00    Total pack years: 20.00    Types: Cigarettes   Smokeless tobacco: Never  Vaping Use   Vaping Use: Never used  Substance and Sexual Activity   Alcohol use:  Never   Drug use: Never   Sexual activity: Not on file  Other Topics Concern   Not on file  Social History Narrative   Not on file   Social Determinants of Health   Financial Resource Strain: Not on file  Food Insecurity: Not on file  Transportation Needs: Not on file  Physical Activity: Not on file  Stress: Not on file  Social Connections: Not on file     Vital Signs: There were no vitals taken for this visit.  Physical Exam Skin:    General: Skin is warm.     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  Labs:  CBC: Recent Labs    12/25/21 0431 12/26/21 0424 12/28/21 0418 03/23/22 1726  WBC 11.9* 10.6* 7.4 12.1*  HGB 7.3* 7.1* 7.2* 13.7  HCT 23.2* 22.9* 23.2* 43.0  PLT 582* 552* 610* 365    COAGS: Recent Labs    11/28/21 1054 03/23/22 1726  INR 1.2 1.0    BMP: Recent Labs    12/22/21 0349 12/23/21 0405 12/24/21 0425 03/23/22 1726  NA 135 131* 133* 139  K 4.3 3.8 3.6 4.0  CL 101 97* 99 105  CO2 27 26 28 27   GLUCOSE 99 105* 97 86  BUN 11 14 18 16   CALCIUM 8.5* 8.7* 9.1 9.6  CREATININE 0.37* 0.42* 0.41* 0.73  GFRNONAA >60 >60 >60 >60    LIVER FUNCTION TESTS: Recent Labs    12/16/21 0540 12/19/21 0303 12/20/21 0341  BILITOT 0.3  --  0.4  AST 15  --  21  ALT 16  --  20  ALKPHOS 52  --  65  PROT 5.5*  --  5.7*  ALBUMIN <1.5* 1.6* 1.6*    Assessment:  Sigmoid perforation Abscess drain in place  Follows with [facility] To see Dr 12/21/21 8/28 CT does show great improvement of all aspects in abdomen and pelvis per Dr Freida Busman injection + fistula to colon Drain to remain Continue to bag- stop flushes Follow up in IR Clinic 3 weeks She has good understanding of  plan Agreeable    Signed: 9/28, PA-C 04/14/2022, 2:23 PM   Please refer to Dr. Robet Leu attestation of this note for management and plan.

## 2022-04-19 ENCOUNTER — Encounter (HOSPITAL_COMMUNITY)
Admission: RE | Admit: 2022-04-19 | Discharge: 2022-04-19 | Disposition: A | Discharge status: Home / Self Care | Payer: 59 | Source: Ambulatory Visit | Attending: Nurse Practitioner | Admitting: Nurse Practitioner

## 2022-04-19 DIAGNOSIS — L259 Unspecified contact dermatitis, unspecified cause: Secondary | ICD-10-CM | POA: Diagnosis not present

## 2022-04-19 DIAGNOSIS — Z933 Colostomy status: Secondary | ICD-10-CM | POA: Insufficient documentation

## 2022-04-19 DIAGNOSIS — K651 Peritoneal abscess: Secondary | ICD-10-CM | POA: Insufficient documentation

## 2022-04-19 DIAGNOSIS — K9400 Colostomy complication, unspecified: Secondary | ICD-10-CM

## 2022-04-19 DIAGNOSIS — K9419 Other complications of enterostomy: Secondary | ICD-10-CM

## 2022-04-19 DIAGNOSIS — L24B3 Irritant contact dermatitis related to fecal or urinary stoma or fistula: Secondary | ICD-10-CM

## 2022-04-19 DIAGNOSIS — K5720 Diverticulitis of large intestine with perforation and abscess without bleeding: Secondary | ICD-10-CM | POA: Insufficient documentation

## 2022-04-19 NOTE — Progress Notes (Signed)
 Ostomy Clinic   Reason for visit:  LLQ colostomy  HPI:  Diverticulitis with perforation of colon, resulting in LLQ colostomy Recurrent pelvic abscess with percutaneous transgluteal drain in place ROS  Review of Systems  Gastrointestinal:        Pelvic abscess with transgluteal drain in place LLQ colostomy with stomal prolapse  Skin:  Positive for rash.       Peristomal dermatitis  All other systems reviewed and are negative.  Vital signs:  BP (!) 133/90 (BP Location: Right Arm)   Pulse 87   Temp 98 F (36.7 C) (Oral)   Resp 18   SpO2 95%  Exam:  Physical Exam Vitals reviewed.  Constitutional:      Appearance: Normal appearance.  Abdominal:     Palpations: Abdomen is soft.  Genitourinary:    Comments: Percutaneous drain Skin:    Findings: Rash present.  Neurological:     Mental Status: She is alert and oriented to person, place, and time.  Psychiatric:        Mood and Affect: Mood normal.     Comments: Trying to return to work, has to leave early due to pain.  Job does not involve heavy lifting     Stoma type/location:  LLQ colostomy Stomal assessment/size:  1 3/4" pink and moist loop stoma, slightly oval. Patient states that ostomy fluctuates in and out. I assure her that this is not a problem as long as colostomy is functioning. We will add a belt to support the weight of the stoma as it changes.  Peristomal assessment:  irritant dermatitis to peristomal skin at 3 and 9 o'clock.   Treatment options for stomal/peristomal skin: We will implement stoma powder and skin prep to protect irritated skin.  We will add a barrier ring as well.   We will switch to a 1 piece convex pouch with filter. (Item # [id number]) Output: soft brown stool Ostomy pouching: 1pc.convex with barrier ring, stoma powder and skin prep and ostomy belt.  Education provided:  Patient was enrolled with secure start patient assistance program.  I will contact them regarding new pouch. She also  has medical insurance at this time.  Unclear about supply acquisition    Impression/dx  Contact dermatitis Stomal prolapse, reduces easily Discussion  I discuss the ostomy belt and cutting barrier opening larger to accommodate fluctuating stoma.   Plan  See back in one week to assess dermatitis and prolapse    Visit time: 55 minutes.   Maple Hudson FNP-BC

## 2022-04-19 NOTE — Discharge Instructions (Signed)
New bag is [phone] 1 piece convex pouch with filter Added ostomy belt Stoma powder and skin prep Opening is cut to 1 3/4', slightly oval

## 2022-05-02 DIAGNOSIS — K5720 Diverticulitis of large intestine with perforation and abscess without bleeding: Secondary | ICD-10-CM | POA: Diagnosis not present

## 2022-05-02 DIAGNOSIS — K632 Fistula of intestine: Secondary | ICD-10-CM | POA: Diagnosis not present

## 2022-05-02 DIAGNOSIS — K9409 Other complications of colostomy: Secondary | ICD-10-CM | POA: Diagnosis not present

## 2022-05-03 ENCOUNTER — Other Ambulatory Visit: Payer: Self-pay | Admitting: Surgery

## 2022-05-03 ENCOUNTER — Ambulatory Visit (HOSPITAL_COMMUNITY): Payer: 59 | Admitting: Nurse Practitioner

## 2022-05-03 DIAGNOSIS — K631 Perforation of intestine (nontraumatic): Secondary | ICD-10-CM

## 2022-05-10 ENCOUNTER — Other Ambulatory Visit: Payer: 59

## 2022-05-10 ENCOUNTER — Inpatient Hospital Stay: Admission: RE | Admit: 2022-05-10 | Payer: 59 | Source: Ambulatory Visit

## 2022-05-16 ENCOUNTER — Telehealth: Payer: Self-pay | Admitting: Internal Medicine

## 2022-05-16 NOTE — Telephone Encounter (Signed)
Pt left a message saying Dr. Sophronia Simas sSent a referral to [place] but I don't see it in the workque.  Do you by chance have it?

## 2022-05-20 ENCOUNTER — Encounter: Payer: Self-pay | Admitting: *Deleted

## 2022-05-31 NOTE — Telephone Encounter (Signed)
Pt scheduled OV and is aware

## 2022-06-08 ENCOUNTER — Encounter: Payer: Self-pay | Admitting: Lab

## 2022-06-08 ENCOUNTER — Ambulatory Visit
Admission: RE | Admit: 2022-06-08 | Discharge: 2022-06-08 | Disposition: A | Discharge status: Home / Self Care | Payer: 59 | Source: Ambulatory Visit | Attending: Surgery | Admitting: Surgery

## 2022-06-08 DIAGNOSIS — K631 Perforation of intestine (nontraumatic): Secondary | ICD-10-CM

## 2022-06-08 DIAGNOSIS — Z4682 Encounter for fitting and adjustment of non-vascular catheter: Secondary | ICD-10-CM | POA: Diagnosis not present

## 2022-06-08 DIAGNOSIS — K632 Fistula of intestine: Secondary | ICD-10-CM | POA: Diagnosis not present

## 2022-06-08 HISTORY — PX: IR RADIOLOGIST EVAL & MGMT: IMG5224

## 2022-06-08 MED ORDER — IOPAMIDOL (ISOVUE-300) INJECTION 61%
100.0000 mL | Freq: Once | INTRAVENOUS | Status: AC | PRN
Start: 2022-06-08 — End: 2022-06-08
  Administered 2022-06-08: 100 mL via INTRAVENOUS

## 2022-06-08 NOTE — Progress Notes (Addendum)
Referring Physician(s): Allen,Shelby L  Chief Complaint: The patient is seen in follow up today s/p perforated sigmoid colon abscess. Drain placed in IR originally 11/28/21 Subsequent removal. Recurrent abscess-- Replacement in IR with Dr Miles Costain 03/24/22 Clinic visit 04/14/22 revealed + fistula.  Return today  History of present illness:  Scheduled today for CT and drain injection Doing well Flushing 1x/week OP is greenish and maybe 20 cc in a 3 day period OP purulent and odorous Denies pain; fever/chills Denies nausea/vomiting  To see Dr Freida Busman 06/14/22 Colonoscopy 06/13/22  Past Medical History:  Diagnosis Date   Bipolar disorder (HCC)    Deviated septum     Past Surgical History:  Procedure Laterality Date   ABDOMINAL HYSTERECTOMY     BOWEL RESECTION N/A 12/15/2021   Procedure: SMALL BOWEL RESECTION;  Surgeon: Fritzi Mandes, MD;  Location: [facility] OR;  Service: General;  Laterality: N/A;   IR CATHETER TUBE CHANGE  12/27/2021   IR CATHETER TUBE CHANGE  03/24/2022   IR RADIOLOGIST EVAL & MGMT  01/26/2022   IR RADIOLOGIST EVAL & MGMT  04/14/2022   KNEE ARTHROSCOPY Left    LAPAROTOMY N/A 12/15/2021   Procedure: EXPLORATORY LAPAROTOMY;  Surgeon: Fritzi Mandes, MD;  Location: [facility] OR;  Service: General;  Laterality: N/A;   TRANSVERSE LOOP COLOSTOMY N/A 12/15/2021   Procedure: TRANSVERSE LOOP COLOSTOMY;  Surgeon: Fritzi Mandes, MD;  Location: [facility] OR;  Service: General;  Laterality: N/A;   UPPER GI ENDOSCOPY      Allergies: Other and Sulfa antibiotics  Medications: Prior to Admission medications   Medication Sig Start Date End Date Taking? Authorizing Provider  acetaminophen (TYLENOL) 500 MG tablet Take 2 tablets (1,000 mg total) by mouth every 6 (six) hours. Patient taking differently: Take 500-1,000 mg by mouth every 6 (six) hours as needed (for pain). 12/28/21   Adam Phenix, PA-C  Coenzyme Q10 (CO Q-10) 100 MG CAPS Take 100 mg by mouth in the morning.    [provider]  COLLAGEN PO Take 1 capsule by mouth daily.    [provider]  Magnesium 250 MG TABS Take 250 mg by mouth in the morning.    [provider]  Multiple Vitamin (MULTIVITAMIN) tablet Take 1 tablet by mouth daily.    [provider]  nicotine (NICODERM CQ - DOSED IN MG/24 HOURS) 21 mg/24hr patch Place 1 patch (21 mg total) onto the skin daily. 12/29/21   Adam Phenix, PA-C  Potassium 99 MG TABS Take 99 mg by mouth daily.    [provider]  risperiDONE (RISPERDAL) 0.5 MG tablet Take 0.5 mg by mouth in the morning. Patient not taking: Reported on 03/23/2022    [provider]  risperiDONE (RISPERDAL) 1 MG tablet Take 0.5-1.5 mg by mouth See admin instructions. Take 0.5 mg by mouth in the morning and 1.5 mg at bedtime    [provider]  Sodium Chloride Flush (NORMAL SALINE FLUSH) 0.9 % SOLN Inject 10 MLs by intracatheter daily 03/31/22     sodium chloride flush (NS) 0.9 % SOLN 10 mLs by Intracatheter route daily. 03/25/22 06/23/22  Fritzi Mandes, MD  terbinafine (LAMISIL) 1 % cream Apply 1 Application topically 2 (two) times daily. Apply to affected toe. 03/25/22   Juliet Rude, PA-C  traMADol (ULTRAM) 50 MG tablet Take 1 tablet (50 mg total) by mouth every 12 (twelve) hours as needed for severe pain. 03/25/22   Juliet Rude, PA-C  vitamin  B-12 (CYANOCOBALAMIN) 500 MCG tablet Take 500 mcg by mouth daily.    [provider]     Family History  Problem Relation Age of Onset   Colon cancer Neg Hx     Social History   Socioeconomic History   Marital status: Married    Spouse name: Not on file   Number of children: Not on file   Years of education: Not on file   Highest education level: Not on file  Occupational History   Not on file  Tobacco Use   Smoking status: Every Day    Packs/day: 0.50    Years: 40.00    Total pack years: 20.00    Types: Cigarettes   Smokeless tobacco: Never  Vaping Use    Vaping Use: Never used  Substance and Sexual Activity   Alcohol use: Never   Drug use: Never   Sexual activity: Not on file  Other Topics Concern   Not on file  Social History Narrative   Not on file   Social Determinants of Health   Financial Resource Strain: Not on file  Food Insecurity: Not on file  Transportation Needs: Not on file  Physical Activity: Not on file  Stress: Not on file  Social Connections: Not on file     Vital Signs: T: 99.7; BP: 154/79; P: 80; 02: 96% RA  Physical Exam Skin:    General: Skin is warm.     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  Labs:  CBC: Recent Labs    12/25/21 0431 12/26/21 0424 12/28/21 0418 03/23/22 1726  WBC 11.9* 10.6* 7.4 12.1*  HGB 7.3* 7.1* 7.2* 13.7  HCT 23.2* 22.9* 23.2* 43.0  PLT 582* 552* 610* 365    COAGS: Recent Labs    11/28/21 1054 03/23/22 1726  INR 1.2 1.0    BMP: Recent Labs    12/22/21 0349 12/23/21 0405 12/24/21 0425 03/23/22 1726  NA 135 131* 133* 139  K 4.3 3.8 3.6 4.0  CL 101 97* 99 105  CO2 27 26 28 27   GLUCOSE 99 105* 97 86  BUN 11 14 18 16   CALCIUM 8.5* 8.7* 9.1 9.6  CREATININE 0.37* 0.42* 0.41* 0.73  GFRNONAA >60 >60 >60 >60    LIVER FUNCTION TESTS: Recent Labs    12/16/21 0540 12/19/21 0303 12/20/21 0341  BILITOT 0.3  --  0.4  AST 15  --  21  ALT 16  --  20  ALKPHOS 52  --  65  PROT 5.5*  --  5.7*  ALBUMIN <1.5* 1.6* 1.6*    Assessment:  Sigmoid perforation- follows with Dr Amalia Greenhouse has been in place almost continually since March 2023 Removal briefly in early July 2023 and replaced soon after CT today again showing no collection/abscess per Dr Charlynn Court injection today 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 Anselm Pancoast attestation of this note for management and plan.

## 2022-06-13 ENCOUNTER — Ambulatory Visit: Payer: 59 | Admitting: Gastroenterology

## 2022-06-13 NOTE — Progress Notes (Deleted)
GI Office Note    Referring Provider: Health, Andre Lefort* Primary Care Physician:  Health, Ashley  Primary Gastroenterologist: Elon Alas. Abbey Chatters, DO  Chief Complaint   No chief complaint on file.   History of Present Illness   Judith Miller is a 61 y.o. female presenting today at the request of Health, [facility] [place] [facility] - Dr. Zenia Resides for ***diverticulitis/ostomy prolapse.   Per review of chart.  Patient's last office visit with [facility] surgery, Dr. Zenia Resides on 05/02/2022.  She was seen for follow-up of complicated diverticulitis.  She underwent ex lap and small bowel resection with transverse loop colostomy for perforated sigmoid diverticulitis on 12/15/2021 after failing to improve with nonoperative management percutaneous drain placement.  Her last CT scan had showed a recurrent pelvic fluid collection and had a new drain placed by IR on 7/20.  She had follow-up CT and drain study with IR on 8/10 showing significant improvement in sigmoid colonic formation however study did show persistent fistulous communication with colon.  Her colostomy was functioning well at the time and reported some protuberance at times.  She has followed with the wound ostomy clinic.  She was noted to have never had a colonoscopy therefore she was referred to GI to have this completed prior to definitive sigmoid resection.   Today:    Current Outpatient Medications  Medication Sig Dispense Refill   acetaminophen (TYLENOL) 500 MG tablet Take 2 tablets (1,000 mg total) by mouth every 6 (six) hours. (Patient taking differently: Take 500-1,000 mg by mouth every 6 (six) hours as needed (for pain).) 30 tablet 0   Coenzyme Q10 (CO Q-10) 100 MG CAPS Take 100 mg by mouth in the morning.     COLLAGEN PO Take 1 capsule by mouth daily.     Magnesium 250 MG TABS Take 250 mg by mouth in the morning.     Multiple Vitamin (MULTIVITAMIN) tablet Take 1 tablet by mouth  daily.     nicotine (NICODERM CQ - DOSED IN MG/24 HOURS) 21 mg/24hr patch Place 1 patch (21 mg total) onto the skin daily. 28 patch 0   Potassium 99 MG TABS Take 99 mg by mouth daily.     risperiDONE (RISPERDAL) 0.5 MG tablet Take 0.5 mg by mouth in the morning. (Patient not taking: Reported on 03/23/2022)     risperiDONE (RISPERDAL) 1 MG tablet Take 0.5-1.5 mg by mouth See admin instructions. Take 0.5 mg by mouth in the morning and 1.5 mg at bedtime     Sodium Chloride Flush (NORMAL SALINE FLUSH) 0.9 % SOLN Inject 10 MLs by intracatheter daily 300 mL 2   sodium chloride flush (NS) 0.9 % SOLN 10 mLs by Intracatheter route daily. 300 mL 2   terbinafine (LAMISIL) 1 % cream Apply 1 Application topically 2 (two) times daily. Apply to affected toe. 15 g 1   traMADol (ULTRAM) 50 MG tablet Take 1 tablet (50 mg total) by mouth every 12 (twelve) hours as needed for severe pain. 15 tablet 0   vitamin B-12 (CYANOCOBALAMIN) 500 MCG tablet Take 500 mcg by mouth daily.     No current facility-administered medications for this visit.    Past Medical History:  Diagnosis Date   Bipolar disorder ([place])    Deviated septum     Past Surgical History:  Procedure Laterality Date   ABDOMINAL HYSTERECTOMY     BOWEL RESECTION N/A 12/15/2021   Procedure: SMALL BOWEL RESECTION;  Surgeon: Dwan Bolt, MD;  Location: [facility] OR;  Service: General;  Laterality: N/A;   IR CATHETER TUBE CHANGE  12/27/2021   IR CATHETER TUBE CHANGE  03/24/2022   IR RADIOLOGIST EVAL & MGMT  01/26/2022   IR RADIOLOGIST EVAL & MGMT  04/14/2022   IR RADIOLOGIST EVAL & MGMT  06/08/2022   KNEE ARTHROSCOPY Left    LAPAROTOMY N/A 12/15/2021   Procedure: EXPLORATORY LAPAROTOMY;  Surgeon: Dwan Bolt, MD;  Location: [place];  Service: General;  Laterality: N/A;   TRANSVERSE LOOP COLOSTOMY N/A 12/15/2021   Procedure: TRANSVERSE LOOP COLOSTOMY;  Surgeon: Dwan Bolt, MD;  Location: [facility];  Service: General;  Laterality: N/A;   UPPER GI ENDOSCOPY       Family History  Problem Relation Age of Onset   Colon cancer Neg Hx     Allergies as of 06/13/2022 - Review Complete 04/19/2022  Allergen Reaction Noted   Other Other (See Comments) 03/23/2022   Sulfa antibiotics Other (See Comments) 10/02/2019    Social History   Socioeconomic History   Marital status: Married    Spouse name: Not on file   Number of children: Not on file   Years of education: Not on file   Highest education level: Not on file  Occupational History   Not on file  Tobacco Use   Smoking status: Every Day    Packs/day: 0.50    Years: 40.00    Total pack years: 20.00    Types: Cigarettes   Smokeless tobacco: Never  Vaping Use   Vaping Use: Never used  Substance and Sexual Activity   Alcohol use: Never   Drug use: Never   Sexual activity: Not on file  Other Topics Concern   Not on file  Social History Narrative   Not on file   Social Determinants of Health   Financial Resource Strain: Not on file  Food Insecurity: Not on file  Transportation Needs: Not on file  Physical Activity: Not on file  Stress: Not on file  Social Connections: Not on file  Intimate Partner Violence: Not on file     Review of Systems   Gen: Denies any fever, chills, fatigue, weight loss, lack of appetite.  CV: Denies chest pain, heart palpitations, peripheral edema, syncope.  Resp: Denies shortness of breath at rest or with exertion. Denies wheezing or cough.  GI: see HPI GU : Denies urinary burning, urinary frequency, urinary hesitancy MS: Denies joint pain, muscle weakness, cramps, or limitation of movement.  Derm: Denies rash, itching, dry skin Psych: Denies depression, anxiety, memory loss, and confusion Heme: Denies bruising, bleeding, and enlarged lymph nodes.   Physical Exam   There were no vitals taken for this visit.  General:   Alert and oriented. Pleasant and cooperative. Well-nourished and well-developed.  Head:  Normocephalic and  atraumatic. Eyes:  Without icterus, sclera clear and conjunctiva pink.  Ears:  Normal auditory acuity. Mouth:  No deformity or lesions, oral mucosa pink.  Lungs:  Clear to auscultation bilaterally. No wheezes, rales, or rhonchi. No distress.  Heart:  S1, S2 present without murmurs appreciated.  Abdomen:  +BS, soft, non-tender and non-distended. No HSM noted. No guarding or rebound. No masses appreciated.  Rectal:  Deferred  Msk:  Symmetrical without gross deformities. Normal posture. Extremities:  Without edema. Neurologic:  Alert and  oriented x4;  grossly normal neurologically. Skin:  Intact without significant lesions or rashes. Psych:  Alert and cooperative. Normal mood and affect.   Assessment   Judith Miller  Judith Miller is a 61 y.o. female with a history of *** presenting today with   Complicated sigmoid diverticulitis:  Screening for colon cancer:    PLAN   *** Flex sigmoidoscopy and colonoscopy via ostomy with propofol by Dr. Abbey Chatters  in near future: the risks, benefits, and alternatives have been discussed with the patient in detail. The patient states understanding and desires to proceed. ASA 3 Continue to follow with [facility] to follow with wound/ostomy clinic    Venetia Night, MSN, FNP-BC, AGACNP-BC [facility]

## 2022-06-14 DIAGNOSIS — Z933 Colostomy status: Secondary | ICD-10-CM | POA: Diagnosis not present

## 2022-06-14 DIAGNOSIS — K632 Fistula of intestine: Secondary | ICD-10-CM | POA: Diagnosis not present

## 2022-06-14 DIAGNOSIS — K9409 Other complications of colostomy: Secondary | ICD-10-CM | POA: Diagnosis not present

## 2022-06-14 DIAGNOSIS — K5720 Diverticulitis of large intestine with perforation and abscess without bleeding: Secondary | ICD-10-CM | POA: Diagnosis not present

## 2022-07-12 ENCOUNTER — Ambulatory Visit: Payer: 59 | Admitting: Gastroenterology

## 2022-07-20 ENCOUNTER — Encounter (HOSPITAL_COMMUNITY): Payer: Self-pay | Admitting: Nurse Practitioner

## 2022-08-02 ENCOUNTER — Telehealth: Payer: Self-pay | Admitting: *Deleted

## 2022-08-02 NOTE — Telephone Encounter (Signed)
Kelsie from [facility] called regarding scheduling a colonoscopy for pt. She wanted a return call to see if it could be scheduled.  Called office back and spoke with triage nurse and told her that pt needs to be seen in office before procedure could be scheduled. Pt has had 2 appointments (one no show and one cancelled). She said she would get pt to call back to schedule appt.

## 2022-08-19 ENCOUNTER — Other Ambulatory Visit (HOSPITAL_COMMUNITY): Payer: Self-pay

## 2022-08-19 MED ORDER — SODIUM CHLORIDE FLUSH 0.9 % IV SOLN
10.0000 mL | Freq: Every day | INTRAVENOUS | 2 refills | Status: AC
Start: 2022-08-19 — End: ?
  Filled 2022-08-19 (×2): qty 300, 30d supply, fill #0
  Filled 2023-01-07: qty 300, 30d supply, fill #1

## 2022-08-22 ENCOUNTER — Other Ambulatory Visit (HOSPITAL_COMMUNITY): Payer: Self-pay

## 2022-08-25 ENCOUNTER — Other Ambulatory Visit (HOSPITAL_COMMUNITY): Payer: Self-pay

## 2022-09-09 ENCOUNTER — Encounter: Payer: Self-pay | Admitting: *Deleted

## 2022-09-09 ENCOUNTER — Telehealth: Payer: Self-pay | Admitting: *Deleted

## 2022-09-09 ENCOUNTER — Encounter (HOSPITAL_COMMUNITY): Payer: Self-pay

## 2022-09-09 ENCOUNTER — Ambulatory Visit (INDEPENDENT_AMBULATORY_CARE_PROVIDER_SITE_OTHER): Payer: 59 | Admitting: Gastroenterology

## 2022-09-09 ENCOUNTER — Encounter: Payer: Self-pay | Admitting: Gastroenterology

## 2022-09-09 ENCOUNTER — Encounter (HOSPITAL_COMMUNITY)
Admission: RE | Admit: 2022-09-09 | Discharge: 2022-09-09 | Disposition: A | Discharge status: Home / Self Care | Payer: 59 | Source: Ambulatory Visit | Attending: Internal Medicine | Admitting: Internal Medicine

## 2022-09-09 ENCOUNTER — Other Ambulatory Visit: Payer: Self-pay

## 2022-09-09 VITALS — BP 122/72 | HR 84 | Temp 97.7°F | Ht 65.0 in | Wt 166.0 lb

## 2022-09-09 DIAGNOSIS — K632 Fistula of intestine: Secondary | ICD-10-CM | POA: Diagnosis not present

## 2022-09-09 DIAGNOSIS — K5720 Diverticulitis of large intestine with perforation and abscess without bleeding: Secondary | ICD-10-CM | POA: Diagnosis not present

## 2022-09-09 HISTORY — DX: Colostomy status: Z93.3

## 2022-09-09 MED ORDER — PEG 3350-KCL-NA BICARB-NACL 420 G PO SOLR: 4000.0000 mL | Freq: Once | ORAL | 0 refills | Status: AC

## 2022-09-09 NOTE — Progress Notes (Signed)
GI Office Note    Referring Provider: Michaelle Birks, MD Primary Care Physician:  Health, [place]  Primary Gastroenterologist: Elon Alas. Abbey Chatters, DO   Chief Complaint   Chief Complaint  Patient presents with   Colonoscopy    Colonoscopy screening. LLQ colostomy bag.      History of Present Illness   Judith Miller is a 62 y.o. female presenting today at the request of Dr. Michaelle Birks with [facility] for colonoscopy. She has history of complicated diverticulitis with colon perforation requiring exploratory laparotomy, small bowel resection and diverting loop colostomy 12/2021. 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 Plans to keep drain in place until definitive sigmoid resection. Also known colostomy prolapse. We are being asked to perform colonoscopy prior to her sigmoid colectomy.   Patient states she is doing well. She has postprandially rectal pressure after meals at times. Typically passes only mucous. Appetite is good. Colostomy output good, empties 4-5 times per day. No melena or fresh blood in the bag. Having 1-2 ounces of output in her transgluteal drain per week. No ugi symptoms. No prior colonoscopy.   Her work up with [place] has been delayed because of denied FMLA for an appointment with IR and she has accumulated points against her at work. She cannot afford to lose her job. She is off until Tuesday and is hoping to get her procedure done Monday.   CT abdomen pelvis with contrast October 2023: IMPRESSION: 1. Left transgluteal pelvic drain is stable. There is no residual fluid around the drain and no residual abscess collection. 2. No new abscess collections in the abdomen or pelvis. 3. Colonic diverticulosis with a small soft tissue tract between the sigmoid colon and the percutaneous pelvic  drain. Findings are compatible with patient's known fistula tract. 4. Stable mild fullness in the right renal collecting system and right ureter. 5. 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 At least 50% stenosis involving the proximal left common iliac artery. This could be hemodynamically significant.    Medications   Current Outpatient Medications  Medication Sig Dispense Refill   Coenzyme Q10 (CO Q-10) 100 MG CAPS Take 100 mg by mouth in the morning.     COLLAGEN PO Take 1 capsule by mouth daily.     Magnesium 250 MG TABS Take 250 mg by mouth in the morning.     Multiple Vitamin (MULTIVITAMIN) tablet Take 1 tablet by mouth daily.     Potassium 99 MG TABS Take 99 mg by mouth daily.     Sodium Chloride Flush (NORMAL SALINE FLUSH) 0.9 % SOLN Inject 10 MLs by intracatheter daily 300 mL 2   sodium chloride flush 0.9 % SOLN injection Flush with 10 mLs by Intracatheter route daily. 300 mL 2   terbinafine (LAMISIL) 1 % cream Apply 1 Application topically 2 (two) times daily. Apply to affected toe. 15 g 1   vitamin B-12 (CYANOCOBALAMIN) 500 MCG tablet Take 500 mcg by mouth daily.     No current facility-administered medications for this visit.    Allergies   Allergies as of 09/09/2022 - Review Complete 09/09/2022  Allergen Reaction Noted   Other Other (See Comments) 03/23/2022   Sulfa antibiotics Other (See Comments) 10/02/2019    Past Medical History   Past Medical History:  Diagnosis Date   Bipolar  disorder ([place])    Deviated septum    Diverticulitis of colon with perforation    Fistula of intestine     Past Surgical History   Past Surgical History:  Procedure Laterality Date   BOWEL RESECTION N/A 12/15/2021   Procedure: SMALL BOWEL RESECTION;  Surgeon: Dwan Bolt, MD;  Location: [place];  Service: General;  Laterality: N/A;   IR CATHETER TUBE CHANGE  12/27/2021   IR CATHETER TUBE CHANGE  03/24/2022   IR RADIOLOGIST EVAL & MGMT  01/26/2022    IR RADIOLOGIST EVAL & MGMT  04/14/2022   IR RADIOLOGIST EVAL & MGMT  06/08/2022   KNEE ARTHROSCOPY Left    LAPAROTOMY N/A 12/15/2021   Procedure: EXPLORATORY LAPAROTOMY;  Surgeon: Dwan Bolt, MD;  Location: [place];  Service: General;  Laterality: N/A;   TRANSVERSE LOOP COLOSTOMY N/A 12/15/2021   Procedure: TRANSVERSE LOOP COLOSTOMY;  Surgeon: Dwan Bolt, MD;  Location: [place];  Service: General;  Laterality: N/A;   UPPER GI ENDOSCOPY      Past Family History   Family History  Problem Relation Age of Onset   Colon cancer Neg Hx     Past Social History   Social History   Socioeconomic History   Marital status: Married    Spouse name: Not on file   Number of children: Not on file   Years of education: Not on file   Highest education level: Not on file  Occupational History   Not on file  Tobacco Use   Smoking status: Every Day    Packs/day: 0.50    Years: 40.00    Total pack years: 20.00    Types: Cigarettes   Smokeless tobacco: Never  Vaping Use   Vaping Use: Never used  Substance and Sexual Activity   Alcohol use: Never   Drug use: Never   Sexual activity: Not on file  Other Topics Concern   Not on file  Social History Narrative   Not on file   Social Determinants of Health   Financial Resource Strain: Not on file  Food Insecurity: Not on file  Transportation Needs: Not on file  Physical Activity: Not on file  Stress: Not on file  Social Connections: Not on file  Intimate Partner Violence: Not on file    Review of Systems   General: Negative for anorexia, weight loss, fever, chills, fatigue, weakness. Eyes: Negative for vision changes.  ENT: Negative for hoarseness, difficulty swallowing , nasal congestion. CV: Negative for chest pain, angina, palpitations, dyspnea on exertion, peripheral edema.  Respiratory: Negative for dyspnea at rest, dyspnea on exertion, cough, sputum, wheezing.  GI: See history of present illness. GU:  Negative for  dysuria, hematuria, urinary incontinence, urinary frequency, nocturnal urination.  MS: Negative for joint pain, low back pain.  Derm: Negative for rash or itching.  Neuro: Negative for weakness, abnormal sensation, seizure, frequent headaches, memory loss,  confusion.  Psych: Negative for anxiety, depression, suicidal ideation, hallucinations.  Endo: Negative for unusual weight change.  Heme: Negative for bruising or bleeding. Allergy: Negative for rash or hives.  Physical Exam   BP 122/72 (BP Location: Right Arm, Patient Position: Sitting) Comment: BP recheck  Pulse 84   Temp 97.7 F (36.5 C) (Temporal)   Ht 5\' 5"  (1.651 m)   Wt 166 lb (75.3 kg)   SpO2 95%   BMI 27.62 kg/m    General: Well-nourished, well-developed in no acute distress.  Head: Normocephalic, atraumatic.   Eyes: Conjunctiva  pink, no icterus. Mouth: Oropharyngeal mucosa moist and pink , no lesions erythema or exudate. Neck: Supple without thyromegaly, masses, or lymphadenopathy.  Lungs: Clear to auscultation bilaterally.  Heart: Regular rate and rhythm, no murmurs rubs or gallops.  Abdomen: Bowel sounds are normal, nontender, nondistended, no hepatosplenomegaly or masses,  no abdominal bruits or hernia, no rebound or guarding. Colostomy in left abdomen, prolapsed tissue is healthy appearing. Green stool present. No melena, blood.  Rectal: deferred Extremities: No lower extremity edema. No clubbing or deformities.  Neuro: Alert and oriented x 4 , grossly normal neurologically.  Skin: Warm and dry, no rash or jaundice.   Psych: Alert and cooperative, normal mood and affect.  Labs   Lab Results  Component Value Date   CREATININE 0.73 03/23/2022   BUN 16 03/23/2022   NA 139 03/23/2022   K 4.0 03/23/2022   CL 105 03/23/2022   CO2 27 03/23/2022   Lab Results  Component Value Date   WBC 12.1 (H) 03/23/2022   HGB 13.7 03/23/2022   HCT 43.0 03/23/2022   MCV 90.3 03/23/2022   PLT 365 03/23/2022   Lab  Results  Component Value Date   INR 1.0 03/23/2022   INR 1.2 11/28/2021   Lab Results  Component Value Date   ALT 20 12/20/2021   AST 21 12/20/2021   ALKPHOS 65 12/20/2021   BILITOT 0.4 12/20/2021    Imaging Studies   No results found.  Assessment   Complicated diverticulitis with colon perforation requiring diverting loop colostomy 12/2021. 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 She is here to have first ever colonoscopy prior to consideration of definitive sigmoid colon resection for fistula. Clinically doing well.    PLAN   Colonoscopy via colostomy and rectum. She will receive both oral prep and enemas. ASA 2.  I have discussed the risks, alternatives, benefits with regards to but not limited to the risk of reaction to medication, bleeding, infection, perforation and the patient is agreeable to proceed. Written consent to be obtained.    Laureen Ochs. Bobby Rumpf, [place], [facility]

## 2022-09-09 NOTE — Telephone Encounter (Signed)
Pt informed of pre-op phone call today around 12:45 pm. Verbalized understanding.

## 2022-09-09 NOTE — H&P (View-Only) (Signed)
GI Office Note    Referring Provider: Michaelle Birks, MD Primary Care Physician:  [facility] [place]  Primary Gastroenterologist: Elon Alas. Abbey Chatters, DO   Chief Complaint   Chief Complaint  Patient presents with   Colonoscopy    Colonoscopy screening. LLQ colostomy bag.      History of Present Illness   Judith Miller is a 62 y.o. female presenting today at the request of Dr. Michaelle Birks with [facility] for colonoscopy. She has history of complicated diverticulitis with colon perforation requiring exploratory laparotomy, small bowel resection and diverting loop colostomy 12/2021. 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 Plans to keep drain in place until definitive sigmoid resection. Also known colostomy prolapse. We are being asked to perform colonoscopy prior to her sigmoid colectomy.   Patient states she is doing well. She has postprandially rectal pressure after meals at times. Typically passes only mucous. Appetite is good. Colostomy output good, empties 4-5 times per day. No melena or fresh blood in the bag. Having 1-2 ounces of output in her transgluteal drain per week. No ugi symptoms. No prior colonoscopy.   Her work up with [place] has been delayed because of denied FMLA for an appointment with IR and she has accumulated points against her at work. She cannot afford to lose her job. She is off until Tuesday and is hoping to get her procedure done Monday.   CT abdomen pelvis with contrast October 2023: IMPRESSION: 1. Left transgluteal pelvic drain is stable. There is no residual fluid around the drain and no residual abscess collection. 2. No new abscess collections in the abdomen or pelvis. 3. Colonic diverticulosis with a small soft tissue tract between the sigmoid colon and the percutaneous pelvic  drain. Findings are compatible with patient's known fistula tract. 4. Stable mild fullness in the right renal collecting system and right ureter. 5. 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 At least 50% stenosis involving the proximal left common iliac artery. This could be hemodynamically significant.    Medications   Current Outpatient Medications  Medication Sig Dispense Refill   Coenzyme Q10 (CO Q-10) 100 MG CAPS Take 100 mg by mouth in the morning.     COLLAGEN PO Take 1 capsule by mouth daily.     Magnesium 250 MG TABS Take 250 mg by mouth in the morning.     Multiple Vitamin (MULTIVITAMIN) tablet Take 1 tablet by mouth daily.     Potassium 99 MG TABS Take 99 mg by mouth daily.     Sodium Chloride Flush (NORMAL SALINE FLUSH) 0.9 % SOLN Inject 10 MLs by intracatheter daily 300 mL 2   sodium chloride flush 0.9 % SOLN injection Flush with 10 mLs by Intracatheter route daily. 300 mL 2   terbinafine (LAMISIL) 1 % cream Apply 1 Application topically 2 (two) times daily. Apply to affected toe. 15 g 1   vitamin B-12 (CYANOCOBALAMIN) 500 MCG tablet Take 500 mcg by mouth daily.     No current facility-administered medications for this visit.    Allergies   Allergies as of 09/09/2022 - Review Complete 09/09/2022  Allergen Reaction Noted   Other Other (See Comments) 03/23/2022   Sulfa antibiotics Other (See Comments) 10/02/2019    Past Medical History   Past Medical History:  Diagnosis Date   Bipolar  disorder ([place])    Deviated septum    Diverticulitis of colon with perforation    Fistula of intestine     Past Surgical History   Past Surgical History:  Procedure Laterality Date   BOWEL RESECTION N/A 12/15/2021   Procedure: SMALL BOWEL RESECTION;  Surgeon: Dwan Bolt, MD;  Location: [place];  Service: General;  Laterality: N/A;   IR CATHETER TUBE CHANGE  12/27/2021   IR CATHETER TUBE CHANGE  03/24/2022   IR RADIOLOGIST EVAL & MGMT  01/26/2022    IR RADIOLOGIST EVAL & MGMT  04/14/2022   IR RADIOLOGIST EVAL & MGMT  06/08/2022   KNEE ARTHROSCOPY Left    LAPAROTOMY N/A 12/15/2021   Procedure: EXPLORATORY LAPAROTOMY;  Surgeon: Dwan Bolt, MD;  Location: [place];  Service: General;  Laterality: N/A;   TRANSVERSE LOOP COLOSTOMY N/A 12/15/2021   Procedure: TRANSVERSE LOOP COLOSTOMY;  Surgeon: Dwan Bolt, MD;  Location: [place];  Service: General;  Laterality: N/A;   UPPER GI ENDOSCOPY      Past Family History   Family History  Problem Relation Age of Onset   Colon cancer Neg Hx     Past Social History   Social History   Socioeconomic History   Marital status: Married    Spouse name: Not on file   Number of children: Not on file   Years of education: Not on file   Highest education level: Not on file  Occupational History   Not on file  Tobacco Use   Smoking status: Every Day    Packs/day: 0.50    Years: 40.00    Total pack years: 20.00    Types: Cigarettes   Smokeless tobacco: Never  Vaping Use   Vaping Use: Never used  Substance and Sexual Activity   Alcohol use: Never   Drug use: Never   Sexual activity: Not on file  Other Topics Concern   Not on file  Social History Narrative   Not on file   Social Determinants of Health   Financial Resource Strain: Not on file  Food Insecurity: Not on file  Transportation Needs: Not on file  Physical Activity: Not on file  Stress: Not on file  Social Connections: Not on file  Intimate Partner Violence: Not on file    Review of Systems   General: Negative for anorexia, weight loss, fever, chills, fatigue, weakness. Eyes: Negative for vision changes.  ENT: Negative for hoarseness, difficulty swallowing , nasal congestion. CV: Negative for chest pain, angina, palpitations, dyspnea on exertion, peripheral edema.  Respiratory: Negative for dyspnea at rest, dyspnea on exertion, cough, sputum, wheezing.  GI: See history of present illness. GU:  Negative for  dysuria, hematuria, urinary incontinence, urinary frequency, nocturnal urination.  MS: Negative for joint pain, low back pain.  Derm: Negative for rash or itching.  Neuro: Negative for weakness, abnormal sensation, seizure, frequent headaches, memory loss,  confusion.  Psych: Negative for anxiety, depression, suicidal ideation, hallucinations.  Endo: Negative for unusual weight change.  Heme: Negative for bruising or bleeding. Allergy: Negative for rash or hives.  Physical Exam   BP 122/72 (BP Location: Right Arm, Patient Position: Sitting) Comment: BP recheck  Pulse 84   Temp 97.7 F (36.5 C) (Temporal)   Ht 5\' 5"  (1.651 m)   Wt 166 lb (75.3 kg)   SpO2 95%   BMI 27.62 kg/m    General: Well-nourished, well-developed in no acute distress.  Head: Normocephalic, atraumatic.   Eyes: Conjunctiva  pink, no icterus. Mouth: Oropharyngeal mucosa moist and pink , no lesions erythema or exudate. Neck: Supple without thyromegaly, masses, or lymphadenopathy.  Lungs: Clear to auscultation bilaterally.  Heart: Regular rate and rhythm, no murmurs rubs or gallops.  Abdomen: Bowel sounds are normal, nontender, nondistended, no hepatosplenomegaly or masses,  no abdominal bruits or hernia, no rebound or guarding. Colostomy in left abdomen, prolapsed tissue is healthy appearing. Green stool present. No melena, blood.  Rectal: deferred Extremities: No lower extremity edema. No clubbing or deformities.  Neuro: Alert and oriented x 4 , grossly normal neurologically.  Skin: Warm and dry, no rash or jaundice.   Psych: Alert and cooperative, normal mood and affect.  Labs   Lab Results  Component Value Date   CREATININE 0.73 03/23/2022   BUN 16 03/23/2022   NA 139 03/23/2022   K 4.0 03/23/2022   CL 105 03/23/2022   CO2 27 03/23/2022   Lab Results  Component Value Date   WBC 12.1 (H) 03/23/2022   HGB 13.7 03/23/2022   HCT 43.0 03/23/2022   MCV 90.3 03/23/2022   PLT 365 03/23/2022   Lab  Results  Component Value Date   INR 1.0 03/23/2022   INR 1.2 11/28/2021   Lab Results  Component Value Date   ALT 20 12/20/2021   AST 21 12/20/2021   ALKPHOS 65 12/20/2021   BILITOT 0.4 12/20/2021    Imaging Studies   No results found.  Assessment   Complicated diverticulitis with colon perforation requiring diverting loop colostomy 12/2021. 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 She is here to have first ever colonoscopy prior to consideration of definitive sigmoid colon resection for fistula. Clinically doing well.    PLAN   Colonoscopy via colostomy and rectum. She will receive both oral prep and enemas. ASA 2.  I have discussed the risks, alternatives, benefits with regards to but not limited to the risk of reaction to medication, bleeding, infection, perforation and the patient is agreeable to proceed. Written consent to be obtained.    Samule Life S. Yohana Bartha, MHS, PA-C [facility]  

## 2022-09-09 NOTE — Patient Instructions (Addendum)
Colonoscopy to be scheduled. See separate instructions. You will have your entire colon examined through your colostomy and rectum.

## 2022-09-12 ENCOUNTER — Ambulatory Visit (HOSPITAL_COMMUNITY)
Admission: RE | Admit: 2022-09-12 | Discharge: 2022-09-12 | Disposition: A | Discharge status: Home / Self Care | Payer: 59 | Attending: Internal Medicine | Admitting: Internal Medicine

## 2022-09-12 ENCOUNTER — Telehealth: Payer: Self-pay | Admitting: Internal Medicine

## 2022-09-12 ENCOUNTER — Other Ambulatory Visit: Payer: Self-pay

## 2022-09-12 ENCOUNTER — Ambulatory Visit (HOSPITAL_COMMUNITY): Payer: 59 | Admitting: Certified Registered Nurse Anesthetist

## 2022-09-12 ENCOUNTER — Ambulatory Visit (HOSPITAL_BASED_OUTPATIENT_CLINIC_OR_DEPARTMENT_OTHER): Payer: 59 | Admitting: Certified Registered Nurse Anesthetist

## 2022-09-12 ENCOUNTER — Encounter (HOSPITAL_COMMUNITY): Payer: Self-pay

## 2022-09-12 ENCOUNTER — Encounter (HOSPITAL_COMMUNITY)
Admission: RE | Disposition: A | Discharge status: Home / Self Care | Payer: Self-pay | Source: Home / Self Care | Attending: Internal Medicine

## 2022-09-12 DIAGNOSIS — K5720 Diverticulitis of large intestine with perforation and abscess without bleeding: Secondary | ICD-10-CM | POA: Diagnosis not present

## 2022-09-12 DIAGNOSIS — I700 Atherosclerosis of aorta: Secondary | ICD-10-CM | POA: Insufficient documentation

## 2022-09-12 DIAGNOSIS — Z79899 Other long term (current) drug therapy: Secondary | ICD-10-CM | POA: Diagnosis not present

## 2022-09-12 DIAGNOSIS — K5732 Diverticulitis of large intestine without perforation or abscess without bleeding: Secondary | ICD-10-CM

## 2022-09-12 DIAGNOSIS — F319 Bipolar disorder, unspecified: Secondary | ICD-10-CM | POA: Insufficient documentation

## 2022-09-12 DIAGNOSIS — Z933 Colostomy status: Secondary | ICD-10-CM | POA: Diagnosis not present

## 2022-09-12 DIAGNOSIS — Z538 Procedure and treatment not carried out for other reasons: Secondary | ICD-10-CM | POA: Diagnosis not present

## 2022-09-12 DIAGNOSIS — F17210 Nicotine dependence, cigarettes, uncomplicated: Secondary | ICD-10-CM | POA: Diagnosis not present

## 2022-09-12 DIAGNOSIS — Z8719 Personal history of other diseases of the digestive system: Secondary | ICD-10-CM | POA: Diagnosis not present

## 2022-09-12 DIAGNOSIS — Z1211 Encounter for screening for malignant neoplasm of colon: Secondary | ICD-10-CM | POA: Insufficient documentation

## 2022-09-12 DIAGNOSIS — K632 Fistula of intestine: Secondary | ICD-10-CM | POA: Diagnosis not present

## 2022-09-12 DIAGNOSIS — R69 Illness, unspecified: Secondary | ICD-10-CM | POA: Diagnosis not present

## 2022-09-12 DIAGNOSIS — I2510 Atherosclerotic heart disease of native coronary artery without angina pectoris: Secondary | ICD-10-CM | POA: Diagnosis not present

## 2022-09-12 HISTORY — PX: COLONOSCOPY WITH PROPOFOL: SHX5780

## 2022-09-12 SURGERY — COLONOSCOPY WITH PROPOFOL
Anesthesia: General

## 2022-09-12 MED ORDER — LACTATED RINGERS IV SOLN: INTRAVENOUS | Status: DC | PRN

## 2022-09-12 MED ORDER — PROPOFOL 500 MG/50ML IV EMUL
INTRAVENOUS | Status: DC | PRN
  Administered 2022-09-12: 175 ug/kg/min via INTRAVENOUS

## 2022-09-12 MED ORDER — LIDOCAINE HCL (PF) 2 % IJ SOLN
INTRAMUSCULAR | Status: AC
  Filled 2022-09-12: qty 15

## 2022-09-12 MED ORDER — PROPOFOL 10 MG/ML IV BOLUS
INTRAVENOUS | Status: DC | PRN
  Administered 2022-09-12: 50 mg via INTRAVENOUS

## 2022-09-12 MED ORDER — LIDOCAINE 2% (20 MG/ML) 5 ML SYRINGE
INTRAMUSCULAR | Status: DC | PRN
  Administered 2022-09-12: 80 mg via INTRAVENOUS

## 2022-09-12 MED ORDER — LACTATED RINGERS IV SOLN: INTRAVENOUS | Status: DC

## 2022-09-12 NOTE — Anesthesia Preprocedure Evaluation (Signed)
Anesthesia Evaluation  Patient identified by MRN, date of birth, ID band Patient awake    Reviewed: Allergy & Precautions, H&P , NPO status , Patient's Chart, lab work & pertinent test results, reviewed documented beta blocker date and time   Airway Mallampati: II  TM Distance: >3 FB Neck ROM: full    Dental no notable dental hx.    Pulmonary neg pulmonary ROS, Current Smoker and Patient abstained from smoking.   Pulmonary exam normal breath sounds clear to auscultation       Cardiovascular Exercise Tolerance: Good negative cardio ROS  Rhythm:regular Rate:Normal     Neuro/Psych  PSYCHIATRIC DISORDERS   Bipolar Disorder   negative neurological ROS  negative psych ROS   GI/Hepatic negative GI ROS, Neg liver ROS,,,  Endo/Other  negative endocrine ROS    Renal/GU negative Renal ROS  negative genitourinary   Musculoskeletal   Abdominal   Peds  Hematology negative hematology ROS (+)   Anesthesia Other Findings   Reproductive/Obstetrics negative OB ROS                             Anesthesia Physical Anesthesia Plan  ASA: 2  Anesthesia Plan: General   Post-op Pain Management:    Induction:   PONV Risk Score and Plan: Propofol infusion  Airway Management Planned:   Additional Equipment:   Intra-op Plan:   Post-operative Plan:   Informed Consent: I have reviewed the patients History and Physical, chart, labs and discussed the procedure including the risks, benefits and alternatives for the proposed anesthesia with the patient or authorized representative who has indicated his/her understanding and acceptance.     Dental Advisory Given  Plan Discussed with: CRNA  Anesthesia Plan Comments:        Anesthesia Quick Evaluation

## 2022-09-12 NOTE — Telephone Encounter (Signed)
Just finished colonoscopy on this patient.  Unfortunately she had a lot of stool and food debris in her sigmoid and rectum.  Needs to be set up next available for repeat exam.    Discussed case with surgery who recommends Dulcolax enema followed by tapwater enema to hopefully get her better cleaned out.  She will need to also complete normal oral bowel prep for the right side of her colon.  Judith Miller can we set this up?  CCing [place] as an [place].  Thank you

## 2022-09-12 NOTE — Op Note (Signed)
[facility] Patient Name: Judith Miller Procedure Date: 09/12/2022 11:42 AM MRN: [id number] Date of Birth: Aug 02, 1961 Attending MD: Elon Alas. Abbey Chatters , [place], [id number] CSN: [id number] Age: 62 Admit Type: Outpatient Procedure:                Colonoscopy Indications:              Follow-up of diverticulitis Providers:                Elon Alas. Abbey Chatters, DO, Caprice Kluver, Everardo [place] Referring MD:              Medicines:                See the Anesthesia note for documentation of the                            administered medications Complications:            No immediate complications. Estimated Blood Loss:     Estimated blood loss: none. Procedure:                Pre-Anesthesia Assessment:                           - The anesthesia plan was to use monitored                            anesthesia care (MAC).                           After obtaining informed consent, the colonoscope                            was passed under direct vision. Throughout the                            procedure, the patient's blood pressure, pulse, and                            oxygen saturations were monitored continuously. The                            [id number]-HQ190L ([id number]) scope was introduced through                            the anus with the intention of advancing to the                            cecum. The scope was advanced to the transverse                            colon before the procedure was aborted. Medications                            were given. The colonoscopy was performed without                            difficulty.  The patient tolerated the procedure                            well. The quality of the bowel preparation was                            evaluated using the BBPS [facility] Bowel Preparation                            Scale) with scores of: Transverse Colon = 2 (minor                            amount of residual staining, small fragments of                             stool and/or opaque liquid, but mucosa seen well)                            and Left Colon = 1 (portion of mucosa seen, but                            other areas not well seen due to staining, residual                            stool and/or opaque liquid). The total BBPS score                            equals 3. The quality of the bowel preparation was                            inadequate. Scope In: 12:23:43 PM Scope Out: 12:43:23 PM Total Procedure Duration: 0 hours 19 minutes 40 seconds  Findings:      Colonoscope introduced through the anus, solid stool was found in the       rectum and in the recto-sigmoid colon, precluding visualization.       Colonoscope then introduced into the ostomy and advanced to sigmoid       colon. Large amounds of food debris/stool noted. The colon that was       visualized appeared healthy without evidence of polyps. However,       evaluation incomplete today. Procedure was then aborted without attempt       at visualizing R side of colon. Impression:               - Preparation of the colon was inadequate.                           - Stool in the rectum and in the recto-sigmoid                            colon.                           - No specimens collected. Moderate Sedation:      Per Anesthesia Care  Recommendation:           - Patient has a contact number available for                            emergencies. The signs and symptoms of potential                            delayed complications were discussed with the                            patient. Return to normal activities tomorrow.                            Written discharge instructions were provided to the                            patient.                           - Resume previous diet.                           - Continue present medications.                           - Repeat colonoscopy at the next available                            appointment because the bowel  preparation was                            suboptimal. Discussed case further with surgery to                            obtain better prep given patient's post operative                            anatomy. Recommends using Dulcolax enema followed                            by tap water enema rectally. Patient does state she                            completed fleet enema this AM as instructed. Procedure Code(s):        --- Professional ---                           [phone], 24, Colonoscopy, flexible; diagnostic,                            including collection of specimen(s) by brushing or                            washing, when performed (separate procedure) Diagnosis Code(s):        --- Professional ---  K57.32, Diverticulitis of large intestine without                            perforation or abscess without bleeding CPT copyright 2022 American Medical Association. All rights reserved. The codes documented in this report are preliminary and upon coder review may  be revised to meet current compliance requirements. Elon Alas. Abbey Chatters, DO [place] Abbey Chatters, DO 09/12/2022 1:21:17 PM This report has been signed electronically. Number of Addenda: 0

## 2022-09-12 NOTE — Interval H&P Note (Signed)
History and Physical Interval Note:  09/12/2022 11:43 AM  Judith Miller  has presented today for surgery, with the diagnosis of H/O complicated diverticulitis,sigmoid fistula.  The various methods of treatment have been discussed with the patient and family. After consideration of risks, benefits and other options for treatment, the patient has consented to  Procedure(s) with comments: COLONOSCOPY WITH PROPOFOL (N/A) - 2:00 pm, pt unsure if she will have a ride as a surgical intervention.  The patient's history has been reviewed, patient examined, no change in status, stable for surgery.  I have reviewed the patient's chart and labs.  Questions were answered to the patient's satisfaction.     Eloise Harman

## 2022-09-12 NOTE — Transfer of Care (Signed)
Immediate Anesthesia Transfer of Care Note  Patient: Judith Miller  Procedure(s) Performed: COLONOSCOPY WITH PROPOFOL  Patient Location: PACU  Anesthesia Type:General  Level of Consciousness: awake  Airway & Oxygen Therapy: Patient Spontanous Breathing  Post-op Assessment: Report given to RN and Post -op Vital signs reviewed and stable  Post vital signs: Reviewed and stable  Last Vitals:  Vitals Value Taken Time  BP    Temp    Pulse    Resp    SpO2      Last Pain:  Vitals:   09/12/22 1216  TempSrc:   PainSc: 0-No pain      Patients Stated Pain Goal: 5 (87/68/11 5726)  Complications: No notable events documented.

## 2022-09-12 NOTE — Discharge Instructions (Signed)
  Colonoscopy Discharge Instructions  Read the instructions outlined below and refer to this sheet in the next few weeks. These discharge instructions provide you with general information on caring for yourself after you leave the hospital. Your doctor may also give you specific instructions. While your treatment has been planned according to the most current medical practices available, unavoidable complications occasionally occur.   ACTIVITY You may resume your regular activity, but move at a slower pace for the next 24 hours.  Take frequent rest periods for the next 24 hours.  Walking will help get rid of the air and reduce the bloated feeling in your belly (abdomen).  No driving for 24 hours (because of the medicine (anesthesia) used during the test).   Do not sign any important legal documents or operate any machinery for 24 hours (because of the anesthesia used during the test).  NUTRITION Drink plenty of fluids.  You may resume your normal diet as instructed by your doctor.  Begin with a light meal and progress to your normal diet. Heavy or fried foods are harder to digest and may make you feel sick to your stomach (nauseated).  Avoid alcoholic beverages for 24 hours or as instructed.  MEDICATIONS You may resume your normal medications unless your doctor tells you otherwise.  WHAT YOU CAN EXPECT TODAY Some feelings of bloating in the abdomen.  Passage of more gas than usual.  Spotting of blood in your stool or on the toilet paper.  IF YOU HAD POLYPS REMOVED DURING THE COLONOSCOPY: No aspirin products for 7 days or as instructed.  No alcohol for 7 days or as instructed.  Eat a soft diet for the next 24 hours.  FINDING OUT THE RESULTS OF YOUR TEST Not all test results are available during your visit. If your test results are not back during the visit, make an appointment with your caregiver to find out the results. Do not assume everything is normal if you have not heard from your  caregiver or the medical facility. It is important for you to follow up on all of your test results.  SEEK IMMEDIATE MEDICAL ATTENTION IF: You have more than a spotting of blood in your stool.  Your belly is swollen (abdominal distention).  You are nauseated or vomiting.  You have a temperature over 101.  You have abdominal pain or discomfort that is severe or gets worse throughout the day.   Unfortunately, your colon was not adequately prepped today for colonoscopy. Would recommend repeat colonoscopy in near future.  We will try combination of Dulcolax enema followed by tapwater enema.  We will send you with these instructions.   I hope you have a great rest of your week!  Elon Alas. Abbey Chatters, D.O. Gastroenterology and Hepatology [facility]

## 2022-09-13 NOTE — Anesthesia Postprocedure Evaluation (Signed)
Anesthesia Post Note  Patient: Judith Miller  Procedure(s) Performed: COLONOSCOPY WITH PROPOFOL  Patient location during evaluation: Phase II Anesthesia Type: General Level of consciousness: awake Pain management: pain level controlled Vital Signs Assessment: post-procedure vital signs reviewed and stable Respiratory status: spontaneous breathing and respiratory function stable Cardiovascular status: blood pressure returned to baseline and stable Postop Assessment: no headache and no apparent nausea or vomiting Anesthetic complications: no Comments: Late entry   No notable events documented.   Last Vitals:  Vitals:   09/12/22 1156 09/12/22 1250  BP:  (!) 109/58  Pulse: 85 84  Resp: 20 (!) 22  Temp: 36.9 C (!) 36.4 C  SpO2: 96% 93%    Last Pain:  Vitals:   09/12/22 1250  TempSrc: Oral  PainSc: 0-No pain                 Louann Sjogren

## 2022-09-13 NOTE — Telephone Encounter (Signed)
Per Dr. Abbey Chatters "Normal oral prep is fine. She needs dulcolax enema followed by tap water enema. No Fleets"  Called pt, LMOVM

## 2022-09-14 NOTE — Telephone Encounter (Signed)
Received VM from pt to call after 3:30 when she gets off work

## 2022-09-14 NOTE — Telephone Encounter (Signed)
Received VM from pt, called back and LMTCB 

## 2022-09-16 ENCOUNTER — Encounter (HOSPITAL_COMMUNITY): Payer: Self-pay | Admitting: Internal Medicine

## 2022-09-28 DIAGNOSIS — Z933 Colostomy status: Secondary | ICD-10-CM | POA: Diagnosis not present

## 2022-09-29 DIAGNOSIS — Z933 Colostomy status: Secondary | ICD-10-CM | POA: Diagnosis not present

## 2022-11-03 ENCOUNTER — Other Ambulatory Visit: Payer: Self-pay

## 2022-11-10 DIAGNOSIS — R350 Frequency of micturition: Secondary | ICD-10-CM | POA: Diagnosis not present

## 2022-11-10 DIAGNOSIS — J0140 Acute pansinusitis, unspecified: Secondary | ICD-10-CM | POA: Diagnosis not present

## 2022-11-28 ENCOUNTER — Telehealth: Payer: Self-pay | Admitting: Internal Medicine

## 2022-11-28 NOTE — Telephone Encounter (Signed)
Pt called after hours on Friday and LMOM that she needed to schedule her colonoscopy. Dr Abbey Chatters did procedure in January and wanted her rescheduled to next available. Can she be scheduled for procedure or will she need to come back in for another OV? [phone]

## 2022-11-28 NOTE — Telephone Encounter (Signed)
SEE PRIOR NOTE. Called. Pt. Scheduled for 4/23 at 11:15am. With Dr. Abbey Chatters, she is aware she will have a little more extensive prep this time. Confirmed address. Confirmed pharmacy.

## 2022-12-21 ENCOUNTER — Other Ambulatory Visit: Payer: Self-pay | Admitting: *Deleted

## 2022-12-21 MED ORDER — PEG 3350-KCL-NA BICARB-NACL 420 G PO SOLR: 4000.0000 mL | Freq: Once | ORAL | 0 refills | Status: AC

## 2022-12-22 ENCOUNTER — Telehealth: Payer: Self-pay | Admitting: *Deleted

## 2022-12-22 NOTE — Telephone Encounter (Signed)
Pt called and wanted to know if she could take her allergy medication before her procedure. Advised pt that she could take allergy medication the night prior. Verbalized understanding.

## 2022-12-27 ENCOUNTER — Other Ambulatory Visit: Payer: Self-pay

## 2022-12-27 ENCOUNTER — Ambulatory Visit (HOSPITAL_COMMUNITY): Payer: 59 | Admitting: Anesthesiology

## 2022-12-27 ENCOUNTER — Encounter (HOSPITAL_COMMUNITY): Payer: Self-pay

## 2022-12-27 ENCOUNTER — Telehealth: Payer: Self-pay

## 2022-12-27 ENCOUNTER — Encounter (HOSPITAL_COMMUNITY)
Admission: RE | Disposition: A | Discharge status: Home / Self Care | Payer: Self-pay | Source: Home / Self Care | Attending: Internal Medicine

## 2022-12-27 ENCOUNTER — Ambulatory Visit (HOSPITAL_BASED_OUTPATIENT_CLINIC_OR_DEPARTMENT_OTHER): Payer: 59 | Admitting: Anesthesiology

## 2022-12-27 ENCOUNTER — Ambulatory Visit (HOSPITAL_COMMUNITY)
Admission: RE | Admit: 2022-12-27 | Discharge: 2022-12-27 | Disposition: A | Discharge status: Home / Self Care | Payer: 59 | Attending: Internal Medicine | Admitting: Internal Medicine

## 2022-12-27 DIAGNOSIS — D122 Benign neoplasm of ascending colon: Secondary | ICD-10-CM | POA: Diagnosis not present

## 2022-12-27 DIAGNOSIS — F17210 Nicotine dependence, cigarettes, uncomplicated: Secondary | ICD-10-CM

## 2022-12-27 DIAGNOSIS — K5732 Diverticulitis of large intestine without perforation or abscess without bleeding: Secondary | ICD-10-CM

## 2022-12-27 DIAGNOSIS — Z09 Encounter for follow-up examination after completed treatment for conditions other than malignant neoplasm: Secondary | ICD-10-CM | POA: Diagnosis not present

## 2022-12-27 DIAGNOSIS — F319 Bipolar disorder, unspecified: Secondary | ICD-10-CM | POA: Diagnosis not present

## 2022-12-27 DIAGNOSIS — K635 Polyp of colon: Secondary | ICD-10-CM | POA: Diagnosis not present

## 2022-12-27 DIAGNOSIS — K5720 Diverticulitis of large intestine with perforation and abscess without bleeding: Secondary | ICD-10-CM

## 2022-12-27 DIAGNOSIS — Z933 Colostomy status: Secondary | ICD-10-CM | POA: Insufficient documentation

## 2022-12-27 HISTORY — PX: COLONOSCOPY WITH PROPOFOL: SHX5780

## 2022-12-27 HISTORY — PX: POLYPECTOMY: SHX5525

## 2022-12-27 SURGERY — COLONOSCOPY WITH PROPOFOL
Anesthesia: General

## 2022-12-27 MED ORDER — PHENYLEPHRINE 80 MCG/ML (10ML) SYRINGE FOR IV PUSH (FOR BLOOD PRESSURE SUPPORT)
PREFILLED_SYRINGE | INTRAVENOUS | Status: AC
  Filled 2022-12-27: qty 10

## 2022-12-27 MED ORDER — LIDOCAINE HCL (CARDIAC) PF 100 MG/5ML IV SOSY
PREFILLED_SYRINGE | INTRAVENOUS | Status: DC | PRN
  Administered 2022-12-27: 50 mg via INTRAVENOUS

## 2022-12-27 MED ORDER — PHENYLEPHRINE 80 MCG/ML (10ML) SYRINGE FOR IV PUSH (FOR BLOOD PRESSURE SUPPORT)
PREFILLED_SYRINGE | INTRAVENOUS | Status: DC | PRN
  Administered 2022-12-27 (×2): 160 ug via INTRAVENOUS

## 2022-12-27 MED ORDER — LACTATED RINGERS IV SOLN: INTRAVENOUS | Status: DC

## 2022-12-27 MED ORDER — PROPOFOL 500 MG/50ML IV EMUL
INTRAVENOUS | Status: DC | PRN
  Administered 2022-12-27: 150 ug/kg/min via INTRAVENOUS

## 2022-12-27 MED ORDER — PROPOFOL 10 MG/ML IV BOLUS
INTRAVENOUS | Status: DC | PRN
  Administered 2022-12-27: 100 mg via INTRAVENOUS

## 2022-12-27 NOTE — H&P (Signed)
Primary Care Physician:  Patient, No Pcp Per Primary Gastroenterologist:  Dr. Marletta Lor  Pre-Procedure History & Physical: HPI:  Judith Miller is a 62 y.o. female is here for a colonoscopy to be performed for history of complicated diverticulitis with colon perforation requiring diverting loop colostomy 12/2021. 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 She is here to have first ever colonoscopy prior to consideration of definitive sigmoid colon resection for fistula.   Past Medical History:  Diagnosis Date   Bipolar disorder    Colostomy status    Deviated septum    Diverticulitis of colon with perforation    Fistula of intestine     Past Surgical History:  Procedure Laterality Date   BOWEL RESECTION N/A 12/15/2021   Procedure: SMALL BOWEL RESECTION;  Surgeon: Fritzi Mandes, MD;  Location: [facility] OR;  Service: General;  Laterality: N/A;   COLONOSCOPY WITH PROPOFOL N/A 09/12/2022   Procedure: COLONOSCOPY WITH PROPOFOL;  Surgeon: Lanelle Bal, DO;  Location: [facility];  Service: Endoscopy;  Laterality: N/A;  2:00 pm, pt unsure if she will have a ride   IR CATHETER TUBE CHANGE  12/27/2021   IR CATHETER TUBE CHANGE  03/24/2022   IR RADIOLOGIST EVAL & MGMT  01/26/2022   IR RADIOLOGIST EVAL & MGMT  04/14/2022   IR RADIOLOGIST EVAL & MGMT  06/08/2022   KNEE ARTHROSCOPY Left    LAPAROTOMY N/A 12/15/2021   Procedure: EXPLORATORY LAPAROTOMY;  Surgeon: Fritzi Mandes, MD;  Location: [facility] OR;  Service: General;  Laterality: N/A;   TRANSVERSE LOOP COLOSTOMY N/A 12/15/2021   Procedure: TRANSVERSE LOOP COLOSTOMY;  Surgeon: Fritzi Mandes, MD;  Location: [facility] OR;  Service: General;  Laterality: N/A;   UPPER GI ENDOSCOPY      Prior to Admission medications   Medication Sig Start Date End Date Taking? Authorizing Provider  acetaminophen (TYLENOL) 500 MG tablet Take 500 mg by mouth every 6 (six)  hours as needed for mild pain or moderate pain.   Yes [provider]  CALCIUM PO Take 600 mg by mouth daily.   Yes [provider]  Cholecalciferol (VITAMIN D3) 10 MCG (400 UNIT) tablet Take 400 Units by mouth daily.   Yes [provider]  Coenzyme Q10 (CO Q-10) 100 MG CAPS Take 100 mg by mouth 2 (two) times daily.   Yes [provider]  COLLAGEN PO Take 3 g by mouth daily.   Yes [provider]  Cyanocobalamin (VITAMIN B-12 PO) Take 2,500 mcg by mouth daily.   Yes [provider]  levocetirizine (XYZAL) 5 MG tablet Take 5 mg by mouth every evening. 12/08/22  Yes [provider]  Magnesium 250 MG TABS Take 250 mg by mouth in the morning.   Yes [provider]  Misc Natural Products (BEET ROOT PO) Take 1,000 mg by mouth daily.   Yes [provider]  Misc Natural Products (TUMERSAID PO) Take by mouth.   Yes [provider]  Multiple Vitamin (MULTIVITAMIN) tablet Take 1 tablet by mouth daily.   Yes [provider]  OVER THE COUNTER MEDICATION Take 1 tablet by mouth daily as needed (Sinus Headache). Sinus congestion pain relief   Yes [provider]  Potassium 99 MG TABS Take 99 mg by mouth daily.   Yes [provider]  Specialty Vitamins Products (BRAIN PO) Take 1 tablet by mouth 2 (two) times daily. Performance support  Yes [provider]  Turmeric 500 MG CAPS Take 500 mg by mouth daily.   Yes [provider]  vitamin E 180 MG (400 UNITS) capsule Take 400 Units by mouth daily.   Yes [provider]  OVER THE COUNTER MEDICATION Take 1 tablet by mouth daily as needed (toe fungus). Nail fungus    [provider]  Sodium Chloride Flush (NORMAL SALINE FLUSH) 0.9 % SOLN Inject 10 MLs by intracatheter daily 03/31/22     sodium chloride flush 0.9 % SOLN injection Flush with 10 mLs by Intracatheter route daily. 08/19/22   Fritzi Mandes, MD  terbinafine  (LAMISIL) 1 % cream Apply 1 Application topically 2 (two) times daily. Apply to affected toe. Patient not taking: Reported on 12/22/2022 03/25/22   Juliet Rude, PA-C    Allergies as of 11/28/2022 - Review Complete 09/12/2022  Allergen Reaction Noted   Other Other (See Comments) 03/23/2022   Sulfa antibiotics Other (See Comments) 10/02/2019    Family History  Problem Relation Age of Onset   Colon cancer Neg Hx     Social History   Socioeconomic History   Marital status: Single    Spouse name: Not on file   Number of children: Not on file   Years of education: Not on file   Highest education level: Not on file  Occupational History   Not on file  Tobacco Use   Smoking status: Every Day    Packs/day: 0.50    Years: 40.00    Additional pack years: 0.00    Total pack years: 20.00    Types: Cigarettes   Smokeless tobacco: Never  Vaping Use   Vaping Use: Never used  Substance and Sexual Activity   Alcohol use: Never   Drug use: Never   Sexual activity: Not on file  Other Topics Concern   Not on file  Social History Narrative   Not on file   Social Determinants of Health   Financial Resource Strain: Not on file  Food Insecurity: Not on file  Transportation Needs: Not on file  Physical Activity: Not on file  Stress: Not on file  Social Connections: Not on file  Intimate Partner Violence: Not on file    Review of Systems: See HPI, otherwise negative ROS  Physical Exam: Vital signs in last 24 hours: Temp:  [98.2 F (36.8 C)] 98.2 F (36.8 C) (04/23 1022) Pulse Rate:  [89] 89 (04/23 1022) Resp:  [16] 16 (04/23 1022) BP: (105)/(65) 105/65 (04/23 1022) SpO2:  [95 %] 95 % (04/23 1022) Weight:  [78 kg] 78 kg (04/23 1022)   General:   Alert,  Well-developed, well-nourished, pleasant and cooperative in NAD Head:  Normocephalic and atraumatic. Eyes:  Sclera clear, no icterus.   Conjunctiva pink. Ears:  Normal auditory acuity. Nose:  No deformity, discharge,   or lesions. Msk:  Symmetrical without gross deformities. Normal posture. Extremities:  Without clubbing or edema. Neurologic:  Alert and  oriented x4;  grossly normal neurologically. Skin:  Intact without significant lesions or rashes. Psych:  Alert and cooperative. Normal mood and affect.  Impression/Plan: Judith Miller is here for a colonoscopy to be performed for history of complicated diverticulitis with colon perforation requiring diverting loop colostomy 12/2021. 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 She is here to have first ever colonoscopy prior to consideration of definitive sigmoid colon resection for fistula.   The  risks of the procedure including infection, bleed, or perforation as well as benefits, limitations, alternatives and imponderables have been reviewed with the patient. Questions have been answered. All parties agreeable.

## 2022-12-27 NOTE — Op Note (Addendum)
[facility] Patient Name: Judith Miller Procedure Date: 12/27/2022 11:12 AM MRN: [id number] Date of Birth: May 02, 1961 Attending MD: Hennie Duos. Marletta Lor , [place], [id number] CSN: [id number] Age: 62 Admit Type: Outpatient Procedure:                Colonoscopy Indications:              Follow-up of diverticulitis Providers:                Hennie Duos. Marletta Lor, DO, Angelica Ran, Lennice Sites                            Technician, Technician Referring MD:              Medicines:                See the Anesthesia note for documentation of the                            administered medications Complications:            No immediate complications. Estimated Blood Loss:     Estimated blood loss was minimal. Procedure:                Pre-Anesthesia Assessment:                           - The anesthesia plan was to use monitored                            anesthesia care (MAC).                           After obtaining informed consent, the colonoscope                            was passed under direct vision. Throughout the                            procedure, the patient's blood pressure, pulse, and                            oxygen saturations were monitored continuously. The                            [id number]-HQ190L ([id number]) scope was introduced through                            the anus and advanced to the the cecum, identified                            by appendiceal orifice and ileocecal valve. The                            colonoscopy was performed without difficulty. The                            patient tolerated the procedure well. The quality  of the bowel preparation was evaluated using the                            BBPS [facility] Bowel Preparation Scale) with scores                            of: Right Colon = 3, Transverse Colon = 3 and Left                            Colon = 3 (entire mucosa seen well with no residual                            staining,  small fragments of stool or opaque                            liquid). The total BBPS score equals 9. Scope In: 11:25:54 AM Scope Out: 11:46:32 AM Scope Withdrawal Time: 0 hours 10 minutes 28 seconds  Total Procedure Duration: 0 hours 20 minutes 38 seconds  Findings:      Colonoscope introduced through the anus and advanced into the sigmoid       colon. Diverticulosis noted in the sigmoid colon. She had a tightening       in the sigmoid colon which was unable to be traversed with colonoscope,       ?scar tissues from previous diverticulitis. Decision made to try from       the proximal side.      Colonoscope then introduced into the distal ostomy site and advanced to       the sigmoid colon where I again encounted the tightening from proximal       side. This was unable to be traversed from this side as well.       Diverticulosis noted in the sigmoid and distal descending colons.       Examined transverse, descending, proximal sigmoid colons otherwise       unremarkable.      Colonoscope then introduced into the proximal ostomy and advanced to the       cecum which was identified by the appendiceal orifice, ileocecal valve.       5 mm sessile polyp in the ascending colon removed with cold snare.       Resection and retrieval complete. Remainder of cecum, ascending colon,       transverse colon unremarkable. Impression:               - One 5 mm polyp in the ascending colon, removed                            with a cold snare. Resected and retrieved. Moderate Sedation:      Per Anesthesia Care Recommendation:           - Patient has a contact number available for                            emergencies. The signs and symptoms of potential                            delayed  complications were discussed with the                            patient. Return to normal activities tomorrow.                            Written discharge instructions were provided to the                             patient.                           - Resume previous diet.                           - Continue present medications.                           - Await pathology results.                           - Repeat colonoscopy in 5 years for surveillance.                           - Return to GI clinic PRN.                           - Follow up with colorectal surgery to discuss next                            steps. Appears she is planned for sigmoid resection                            given her fistula which will also take care of any                            sigmoid tightening/stricture related to previous                            diverticulitis encountered today. Procedure Code(s):        --- Professional ---                           [phone], Colonoscopy, flexible; with removal of                            tumor(s), polyp(s), or other lesion(s) by snare                            technique Diagnosis Code(s):        --- Professional ---                           D12.2, Benign neoplasm of ascending colon  K57.32, Diverticulitis of large intestine without                            perforation or abscess without bleeding CPT copyright 2022 American Medical Association. All rights reserved. The codes documented in this report are preliminary and upon coder review may  be revised to meet current compliance requirements. Hennie Duos. Marletta Lor, DO Hennie Duos. Marletta Lor, DO 12/27/2022 1:47:44 PM This report has been signed electronically. Number of Addenda: 0

## 2022-12-27 NOTE — Discharge Instructions (Addendum)
  Colonoscopy Discharge Instructions  Read the instructions outlined below and refer to this sheet in the next few weeks. These discharge instructions provide you with general information on caring for yourself after you leave the hospital. Your doctor may also give you specific instructions. While your treatment has been planned according to the most current medical practices available, unavoidable complications occasionally occur.   ACTIVITY You may resume your regular activity, but move at a slower pace for the next 24 hours.  Take frequent rest periods for the next 24 hours.  Walking will help get rid of the air and reduce the bloated feeling in your belly (abdomen).  No driving for 24 hours (because of the medicine (anesthesia) used during the test).   Do not sign any important legal documents or operate any machinery for 24 hours (because of the anesthesia used during the test).  NUTRITION Drink plenty of fluids.  You may resume your normal diet as instructed by your doctor.  Begin with a light meal and progress to your normal diet. Heavy or fried foods are harder to digest and may make you feel sick to your stomach (nauseated).  Avoid alcoholic beverages for 24 hours or as instructed.  MEDICATIONS You may resume your normal medications unless your doctor tells you otherwise.  WHAT YOU CAN EXPECT TODAY Some feelings of bloating in the abdomen.  Passage of more gas than usual.  Spotting of blood in your stool or on the toilet paper.  IF YOU HAD POLYPS REMOVED DURING THE COLONOSCOPY: No aspirin products for 7 days or as instructed.  No alcohol for 7 days or as instructed.  Eat a soft diet for the next 24 hours.  FINDING OUT THE RESULTS OF YOUR TEST Not all test results are available during your visit. If your test results are not back during the visit, make an appointment with your caregiver to find out the results. Do not assume everything is normal if you have not heard from your  caregiver or the medical facility. It is important for you to follow up on all of your test results.  SEEK IMMEDIATE MEDICAL ATTENTION IF: You have more than a spotting of blood in your stool.  Your belly is swollen (abdominal distention).  You are nauseated or vomiting.  You have a temperature over 101.  You have abdominal pain or discomfort that is severe or gets worse throughout the day.   Overall everything looks pretty good today.  You had 1 small polyp which I removed successfully.  We will call with these results.  Repeat colonoscopy in 5 years.  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  I will send Dr. Michaell Cowing a message to update him on findings.  You will need to schedule an office appoint with him to discuss further as well.  I hope you have a great rest of your week!  Hennie Duos. Marletta Lor, D.O. Gastroenterology and Hepatology [facility]

## 2022-12-27 NOTE — Transfer of Care (Signed)
Immediate Anesthesia Transfer of Care Note  Patient: Judith Miller  Procedure(s) Performed: COLONOSCOPY WITH PROPOFOL POLYPECTOMY  Patient Location: Endoscopy Unit  Anesthesia Type:General  Level of Consciousness: awake, alert , and oriented  Airway & Oxygen Therapy: Patient Spontanous Breathing  Post-op Assessment: Report given to RN and Post -op Vital signs reviewed and stable  Post vital signs: Reviewed and stable  Last Vitals:  Vitals Value Taken Time  BP    Temp    Pulse    Resp    SpO2      Last Pain:  Vitals:   12/27/22 1121  TempSrc:   PainSc: 0-No pain      Patients Stated Pain Goal: 5 (12/27/22 1022)  Complications: No notable events documented.

## 2022-12-27 NOTE — Telephone Encounter (Signed)
noted 

## 2022-12-27 NOTE — Anesthesia Procedure Notes (Signed)
Date/Time: 12/27/2022 11:27 AM  Performed by: Julian Reil, CRNAPre-anesthesia Checklist: Emergency Drugs available, Patient identified, Suction available and Patient being monitored Patient Re-evaluated:Patient Re-evaluated prior to induction Oxygen Delivery Method: Nasal cannula Induction Type: IV induction Placement Confirmation: positive ETCO2

## 2022-12-27 NOTE — Anesthesia Postprocedure Evaluation (Signed)
Anesthesia Post Note  Patient: Judith Miller  Procedure(s) Performed: COLONOSCOPY WITH PROPOFOL POLYPECTOMY  Patient location during evaluation: Phase II Anesthesia Type: General Level of consciousness: awake and alert and oriented Pain management: pain level controlled Vital Signs Assessment: post-procedure vital signs reviewed and stable Respiratory status: spontaneous breathing, nonlabored ventilation and respiratory function stable Cardiovascular status: blood pressure returned to baseline and stable Postop Assessment: no apparent nausea or vomiting Anesthetic complications: no  No notable events documented.   Last Vitals:  Vitals:   12/27/22 1022 12/27/22 1153  BP: 105/65 (!) 93/54  Pulse: 89 76  Resp: 16 16  Temp: 36.8 C 36.7 C  SpO2: 95% 95%    Last Pain:  Vitals:   12/27/22 1153  TempSrc: Oral  PainSc: 0-No pain                 Archita Lomeli C Alistar Mcenery

## 2022-12-27 NOTE — Telephone Encounter (Signed)
I called and spoke with patient.  She states she did well after her procedure.  States she went to the grocery store and got groceries when she got home she started having right sided back pain.  She is unsure if she pulled a muscle carrying groceries but wanted to make sure is not related to her colonoscopy.  Denies any abdominal pain.  No nausea or vomiting.  States her abdomen is soft. No fever. She ate lunch without any difficulty.  I am unsure what is causing her right flank pain.  Do not think it is related to the colonoscopy itself.  Recommend she take a Tylenol and see how she does.  If her pain does not improve, worsens, or she has other associated symptoms including abdominal pain/pressure, fever, chest pain, shortness of breath, then she needs to proceed to the emergency room for evaluation.  She understands and is agreeable

## 2022-12-27 NOTE — Telephone Encounter (Signed)
Pt LMOVM regarding her having pain in her side after a colonoscopy. Pt wants to know if it  is normal. She wasn't feeling anything around lunch. Please advise

## 2022-12-27 NOTE — Anesthesia Preprocedure Evaluation (Addendum)
Anesthesia Evaluation  Patient identified by MRN, date of birth, ID band Patient awake    Reviewed: Allergy & Precautions, H&P , NPO status , Patient's Chart, lab work & pertinent test results  Airway Mallampati: II  TM Distance: >3 FB Neck ROM: Full    Dental  (+) Dental Advisory Given, Missing, Loose,    Pulmonary Current SmokerPatient did not abstain from smoking.   Pulmonary exam normal breath sounds clear to auscultation       Cardiovascular negative cardio ROS Normal cardiovascular exam Rhythm:Regular Rate:Normal     Neuro/Psych  PSYCHIATRIC DISORDERS   Bipolar Disorder   negative neurological ROS     GI/Hepatic negative GI ROS, Neg liver ROS,,,  Endo/Other  negative endocrine ROS    Renal/GU negative Renal ROS  negative genitourinary   Musculoskeletal negative musculoskeletal ROS (+)    Abdominal   Peds negative pediatric ROS (+)  Hematology negative hematology ROS (+)   Anesthesia Other Findings Allergies  Reproductive/Obstetrics negative OB ROS                             Anesthesia Physical Anesthesia Plan  ASA: 2  Anesthesia Plan: General   Post-op Pain Management: Minimal or no pain anticipated   Induction: Intravenous  PONV Risk Score and Plan: 1 and Propofol infusion  Airway Management Planned: Nasal Cannula and Natural Airway  Additional Equipment:   Intra-op Plan:   Post-operative Plan:   Informed Consent: I have reviewed the patients History and Physical, chart, labs and discussed the procedure including the risks, benefits and alternatives for the proposed anesthesia with the patient or authorized representative who has indicated his/her understanding and acceptance.     Dental advisory given  Plan Discussed with: CRNA and Surgeon  Anesthesia Plan Comments:         Anesthesia Quick Evaluation

## 2022-12-28 ENCOUNTER — Encounter: Payer: Self-pay | Admitting: Surgery

## 2022-12-28 DIAGNOSIS — Z933 Colostomy status: Secondary | ICD-10-CM | POA: Insufficient documentation

## 2022-12-28 DIAGNOSIS — F319 Bipolar disorder, unspecified: Secondary | ICD-10-CM | POA: Insufficient documentation

## 2022-12-28 LAB — SURGICAL PATHOLOGY

## 2022-12-29 ENCOUNTER — Other Ambulatory Visit: Payer: Self-pay

## 2022-12-29 ENCOUNTER — Emergency Department (HOSPITAL_COMMUNITY)
Admission: EM | Admit: 2022-12-29 | Discharge: 2022-12-29 | Disposition: A | Discharge status: Home / Self Care | Payer: 59 | Attending: Emergency Medicine | Admitting: Emergency Medicine

## 2022-12-29 ENCOUNTER — Ambulatory Visit: Payer: Self-pay

## 2022-12-29 DIAGNOSIS — K921 Melena: Secondary | ICD-10-CM | POA: Insufficient documentation

## 2022-12-29 DIAGNOSIS — Z933 Colostomy status: Secondary | ICD-10-CM | POA: Insufficient documentation

## 2022-12-29 LAB — BASIC METABOLIC PANEL
Anion gap: 11 (ref 5–15)
BUN: 9 mg/dL (ref 8–23)
CO2: 29 mmol/L (ref 22–32)
Calcium: 10.1 mg/dL (ref 8.9–10.3)
Chloride: 101 mmol/L (ref 98–111)
Creatinine, Ser: 0.58 mg/dL (ref 0.44–1.00)
GFR, Estimated: >60 mL/min (ref >60)
Glucose, Bld: 89 mg/dL (ref 70–99)
Potassium: 3.6 mmol/L (ref 3.5–5.1)
Sodium: 141 mmol/L (ref 135–145)

## 2022-12-29 LAB — CBC WITH DIFFERENTIAL/PLATELET
Abs Immature Granulocytes: 0.02 10*3/uL (ref 0.00–0.07)
Basophils Absolute: 0 10*3/uL (ref 0.0–0.1)
Basophils Relative: 0 %
Eosinophils Absolute: 0.2 10*3/uL (ref 0.0–0.5)
Eosinophils Relative: 3 %
HCT: 43.7 % (ref 36.0–46.0)
Hemoglobin: 14.6 g/dL (ref 12.0–15.0)
Immature Granulocytes: 0 %
Lymphocytes Relative: 41 %
Lymphs Abs: 3.9 10*3/uL (ref 0.7–4.0)
MCH: 30.7 pg (ref 26.0–34.0)
MCHC: 33.4 g/dL (ref 30.0–36.0)
MCV: 92 fL (ref 80.0–100.0)
Monocytes Absolute: 0.9 10*3/uL (ref 0.1–1.0)
Monocytes Relative: 10 %
Neutro Abs: 4.5 10*3/uL (ref 1.7–7.7)
Neutrophils Relative %: 46 %
Platelets: 264 10*3/uL (ref 150–400)
RBC: 4.75 MIL/uL (ref 3.87–5.11)
RDW: 12.5 % (ref 11.5–15.5)
WBC: 9.6 10*3/uL (ref 4.0–10.5)
nRBC: 0 % (ref 0.0–0.2)

## 2022-12-29 NOTE — ED Provider Notes (Signed)
[place] EMERGENCY DEPARTMENT AT [facility] Provider Note   CSN: [id number] Arrival date & time: 12/29/22  1721     History  Chief Complaint  Patient presents with   Blood In Stools    Judith Miller is a 62 y.o. female.  HPI   Patient has history of diverticulitis of her colon with perforation.  Patient had a colonoscopy procedure performed on April 23.  Patient was told she might have some blood in her stool but today she noticed more and this concerned her.  Patient is not having any abdominal pain.  No fevers or chills.  Home Medications Prior to Admission medications   Medication Sig Start Date End Date Taking? Authorizing Provider  acetaminophen (TYLENOL) 500 MG tablet Take 500 mg by mouth every 6 (six) hours as needed for mild pain or moderate pain.   Yes [provider]  CALCIUM PO Take 600 mg by mouth daily.   Yes [provider]  Cholecalciferol (VITAMIN D3) 10 MCG (400 UNIT) tablet Take 400 Units by mouth daily.   Yes [provider]  Coenzyme Q10 (CO Q-10) 100 MG CAPS Take 100 mg by mouth 2 (two) times daily.   Yes [provider]  COLLAGEN PO Take 3 g by mouth daily.   Yes [provider]  Cyanocobalamin (VITAMIN B-12 PO) Take 2,500 mcg by mouth daily.   Yes [provider]  levocetirizine (XYZAL) 5 MG tablet Take 5 mg by mouth every evening. 12/08/22  Yes [provider]  Magnesium 250 MG TABS Take 250 mg by mouth in the morning.   Yes [provider]  Misc Natural Products (BEET ROOT PO) Take 1,000 mg by mouth daily.   Yes [provider]  Multiple Vitamin (MULTIVITAMIN) tablet Take 1 tablet by mouth daily.   Yes [provider]  OVER THE COUNTER MEDICATION Take 1 tablet by mouth daily as needed (toe fungus). Nail fungus   Yes [provider]  OVER THE COUNTER MEDICATION Take 1 tablet by mouth daily as needed (Sinus Headache). Sinus congestion pain relief    Yes [provider]  Potassium 99 MG TABS Take 99 mg by mouth daily.   Yes [provider]  Sodium Chloride Flush (NORMAL SALINE FLUSH) 0.9 % SOLN Inject 10 MLs by intracatheter daily 03/31/22  Yes   Turmeric 500 MG CAPS Take 500 mg by mouth daily.   Yes [provider]  vitamin E 180 MG (400 UNITS) capsule Take 400 Units by mouth daily.   Yes [provider]  sodium chloride flush 0.9 % SOLN injection Flush with 10 mLs by Intracatheter route daily. Patient not taking: Reported on 12/29/2022 08/19/22   Fritzi Mandes, MD  Specialty Vitamins Products (BRAIN PO) Take 1 tablet by mouth 2 (two) times daily. Performance support Patient not taking: Reported on 12/29/2022    [provider]      Allergies    Other and Sulfa antibiotics    Review of Systems   Review of Systems  Physical Exam Updated Vital Signs BP (!) 144/72 (BP Location: Right Arm)   Pulse (!) 103   Temp 99.7 F (37.6 C) (Oral)   Resp 16   Wt 78 kg   SpO2 92%   BMI 27.75 kg/m  Physical Exam Vitals and nursing note reviewed.  Constitutional:      General: She is not in acute distress.    Appearance: She is well-developed.  HENT:  Head: Normocephalic and atraumatic.     Right Ear: External ear normal.     Left Ear: External ear normal.  Eyes:     General: No scleral icterus.       Right eye: No discharge.        Left eye: No discharge.     Conjunctiva/sclera: Conjunctivae normal.  Neck:     Trachea: No tracheal deviation.  Cardiovascular:     Rate and Rhythm: Normal rate and regular rhythm.  Pulmonary:     Effort: Pulmonary effort is normal. No respiratory distress.     Breath sounds: Normal breath sounds. No stridor. No wheezing or rales.  Abdominal:     General: Bowel sounds are normal. There is no distension.     Palpations: Abdomen is soft.     Tenderness: There is no abdominal tenderness. There is no guarding or rebound.     Comments: Colostomy noted, no  gross blood in the colostomy bag slight serosanguineous tinge to the fluid  Musculoskeletal:        General: No tenderness or deformity.     Cervical back: Neck supple.  Skin:    General: Skin is warm and dry.     Findings: No rash.  Neurological:     General: No focal deficit present.     Mental Status: She is alert.     Cranial Nerves: No cranial nerve deficit, dysarthria or facial asymmetry.     Sensory: No sensory deficit.     Motor: No abnormal muscle tone or seizure activity.     Coordination: Coordination normal.  Psychiatric:        Mood and Affect: Mood normal.     ED Results / Procedures / Treatments   Labs (all labs ordered are listed, but only abnormal results are displayed) Labs Reviewed  CBC WITH DIFFERENTIAL/PLATELET  BASIC METABOLIC PANEL    EKG None  Radiology No results found.  Procedures Procedures    Medications Ordered in ED Medications - No data to display  ED Course/ Medical Decision Making/ A&P Clinical Course as of 12/29/22 2156  Thu Dec 29, 2022  2147 CBC and metabolic panel normal [JK]  2155 Labs reviewed. [JK]    Clinical Course User Index [JK] Linwood Dibbles, MD                             Medical Decision Making Amount and/or Complexity of Data Reviewed Labs: ordered.   Patient presented to ED for evaluation of trace blood in her stool.  On exam she does not have any gross blood noted in the colostomy bag.  Patient CBC and metabolic panel are normal.  There is no evidence of frank bleeding.  She just had a colonoscopy procedure.  I think she is stable for outpatient follow-up with her GI doctor.  Will continue to have her monitor for any signs of heavy bleeding and to return to the ED if she starts to experience any worsening symptoms        Final Clinical Impression(s) / ED Diagnoses Final diagnoses:  Blood in stool    Rx / DC Orders ED Discharge Orders     None         Linwood Dibbles, MD 12/29/22 2156

## 2022-12-29 NOTE — ED Triage Notes (Signed)
Pt had colonoscopy on Tuesday and provider tried to go through rectum and couldn't get through so had to go through pt stoma from colostomy. Pt was told that there could be a little blood but pt states there has been more than a little blood for 3 days. Called doctor and was advised to come to ED. Denies any pain

## 2022-12-29 NOTE — ED Provider Triage Note (Signed)
Emergency Medicine Provider Triage Evaluation Note  Judith Miller , a 62 y.o. female  was evaluated in triage.  Pt complains of blood in her stool..  Patient has history of diverticulitis with perforation.  She has a colostomy.  Patient states she had a colonoscopy on Tuesday.  They went through her stoma.  Patient was told she might have a little bit of blood after the procedure but today she noticed more blood and was concerned so she came to the ED.  Review of Systems  Positive: Blood in stool Negative: Abdominal pain  Physical Exam  BP (!) 144/72 (BP Location: Right Arm)   Pulse (!) 103   Temp 99.7 F (37.6 C) (Oral)   Resp 16   Wt 78 kg   SpO2 92%   BMI 27.75 kg/m  Gen:   Awake, no distress   Resp:  Normal effort  MSK:   Moves extremities without difficulty  Other:  Abdomen: Soft nontender colostomy bag without clots or gross blood, some serosanguineous tinged fluid  Medical Decision Making  Medically screening exam initiated at 8:51 PM.  Appropriate orders placed.  Judith Miller was informed that the remainder of the evaluation will be completed by another provider, this initial triage assessment does not replace that evaluation, and the importance of remaining in the ED until their evaluation is complete.     Linwood Dibbles, MD 12/29/22 2052

## 2022-12-29 NOTE — Telephone Encounter (Signed)
  Chief Complaint: Blood in stools Symptoms: Above Frequency: Tuesday Pertinent Negatives: Patient denies pain Disposition: ED /[] Urgent Care (no appt availability in office) / Appointment(In office/virtual)/  [facility]/ Home Care/ Refused Recommended Disposition /[] [facility]/  Follow-up with PCP Additional Notes: Pt had a colonoscopy on Tuesday and was told there may be some blood from where a poly was removed. Pt has a stoma. Pt noticed green stool after procedure, and now has gad some rectal bleeding at blood at stoma.  PT has called the office that performed colonoscopy but was not given instructions, or advice. Pt will go to ED for care.    Reason for Disposition  Patient sounds very sick or weak to the triager  Answer Assessment - Initial Assessment Questions 1. COLOR: "What color is it?" "Is that color in part or all of the stool?"     Green now red 2. ONSET: "When was the unusual color first noted?"     Tuesday green 3. CAUSE: "Have you eaten any food or taken any medicine of this color?" Note: See listing in Background Information section.      no 4. OTHER SYMPTOMS: "Do you have any other symptoms?" (e.g., abdomen pain, diarrhea, jaundice, fever).  Protocols used: Stools - Unusual Color-A-AH

## 2022-12-29 NOTE — Discharge Instructions (Addendum)
Your blood tests were normal.  No signs of serious blood loss.  Follow-up with your GI doctor to be rechecked

## 2022-12-30 ENCOUNTER — Encounter (HOSPITAL_COMMUNITY): Payer: Self-pay | Admitting: Internal Medicine

## 2023-01-07 ENCOUNTER — Other Ambulatory Visit (HOSPITAL_COMMUNITY): Payer: Self-pay

## 2023-01-09 ENCOUNTER — Other Ambulatory Visit (HOSPITAL_COMMUNITY): Payer: Self-pay

## 2023-01-09 ENCOUNTER — Ambulatory Visit (HOSPITAL_COMMUNITY)
Admission: RE | Admit: 2023-01-09 | Discharge: 2023-01-09 | Disposition: A | Discharge status: Home / Self Care | Payer: 59 | Source: Ambulatory Visit

## 2023-01-16 ENCOUNTER — Ambulatory Visit: Payer: Self-pay | Admitting: Surgery

## 2023-01-16 ENCOUNTER — Other Ambulatory Visit (HOSPITAL_COMMUNITY): Payer: Self-pay | Admitting: Surgery

## 2023-01-16 DIAGNOSIS — R739 Hyperglycemia, unspecified: Secondary | ICD-10-CM

## 2023-01-16 DIAGNOSIS — F3131 Bipolar disorder, current episode depressed, mild: Secondary | ICD-10-CM

## 2023-01-16 DIAGNOSIS — Z720 Tobacco use: Secondary | ICD-10-CM | POA: Diagnosis not present

## 2023-01-16 DIAGNOSIS — K5720 Diverticulitis of large intestine with perforation and abscess without bleeding: Secondary | ICD-10-CM | POA: Diagnosis not present

## 2023-01-16 DIAGNOSIS — K632 Fistula of intestine: Secondary | ICD-10-CM

## 2023-01-16 DIAGNOSIS — Z933 Colostomy status: Secondary | ICD-10-CM

## 2023-01-16 HISTORY — DX: Fistula of intestine: K63.2

## 2023-01-24 ENCOUNTER — Ambulatory Visit (HOSPITAL_COMMUNITY)
Admission: RE | Admit: 2023-01-24 | Discharge: 2023-01-24 | Disposition: A | Discharge status: Home / Self Care | Payer: 59 | Source: Ambulatory Visit | Attending: Surgery | Admitting: Surgery

## 2023-01-24 DIAGNOSIS — Z933 Colostomy status: Secondary | ICD-10-CM | POA: Diagnosis not present

## 2023-01-24 DIAGNOSIS — Z720 Tobacco use: Secondary | ICD-10-CM | POA: Insufficient documentation

## 2023-01-24 DIAGNOSIS — I700 Atherosclerosis of aorta: Secondary | ICD-10-CM | POA: Insufficient documentation

## 2023-01-24 DIAGNOSIS — F3131 Bipolar disorder, current episode depressed, mild: Secondary | ICD-10-CM | POA: Insufficient documentation

## 2023-01-24 DIAGNOSIS — K5730 Diverticulosis of large intestine without perforation or abscess without bleeding: Secondary | ICD-10-CM | POA: Diagnosis not present

## 2023-01-24 DIAGNOSIS — K632 Fistula of intestine: Secondary | ICD-10-CM | POA: Diagnosis not present

## 2023-01-24 DIAGNOSIS — K5720 Diverticulitis of large intestine with perforation and abscess without bleeding: Secondary | ICD-10-CM | POA: Insufficient documentation

## 2023-01-24 MED ORDER — IOHEXOL 300 MG/ML  SOLN
100.0000 mL | Freq: Once | INTRAMUSCULAR | Status: AC | PRN
  Administered 2023-01-24: 100 mL via INTRAVENOUS

## 2023-01-26 ENCOUNTER — Other Ambulatory Visit: Payer: Self-pay | Admitting: Urology

## 2023-02-01 ENCOUNTER — Encounter (HOSPITAL_COMMUNITY): Payer: Self-pay

## 2023-02-16 NOTE — Progress Notes (Addendum)
COVID Vaccine Completed: no  Date of COVID positive in last 90 days: no  PCP - [facility] Cardiologist - n/a  Chest x-ray - n/a EKG -  n/a Stress Test - n/a ECHO -  n/a Cardiac Cath - n/a Pacemaker/ICD device last checked: n/a Spinal Cord Stimulator: n/a  Bowel Prep - antibiotics, Miralax, dulcolax. Patient aware  Sleep Study - n/a CPAP -   Fasting Blood Sugar - n/a Checks Blood Sugar _____ times a day  Last dose of GLP1 agonist-  N/A GLP1 instructions:  N/A   Last dose of SGLT-2 inhibitors-  N/A SGLT-2 instructions: N/A   Blood Thinner Instructions: n/a Aspirin Instructions: Last Dose:  Activity level: Can go up a flight of stairs and perform activities of daily living without stopping and without symptoms of chest pain or shortness of breath.   Anesthesia review:   Patient denies shortness of breath, fever, cough and chest pain at PAT appointment  Patient verbalized understanding of instructions that were given to them at the PAT appointment. Patient was also instructed that they will need to review over the PAT instructions again at home before surgery.

## 2023-02-16 NOTE — Patient Instructions (Addendum)
SURGICAL WAITING ROOM VISITATION  Patients having surgery or a procedure may have no more than 2 support people in the waiting area - these visitors may rotate.    Children under the age of 90 must have an adult with them who is not the patient.  Due to an increase in RSV and influenza rates and associated hospitalizations, children ages 65 and under may not visit patients in [facility] hospitals.  If the patient needs to stay at the hospital during part of their recovery, the visitor guidelines for inpatient rooms apply. Pre-op nurse will coordinate an appropriate time for 1 support person to accompany patient in pre-op.  This support person may not rotate.    Please refer to the [facility] website for the visitor guidelines for Inpatients (after your surgery is over and you are in a regular room).    Your procedure is scheduled on: 03/01/23   Report to [facility] Main Entrance    Report to admitting at 11:00 AM   Call this number if you have problems the morning of surgery [phone]   Follow a clear liquid diet the day before  Water Non-Citrus Juices (without pulp, NO RED-Apple, White grape, White cranberry) Black Coffee (NO MILK/CREAM OR CREAMERS, sugar ok)  Clear Tea (NO MILK/CREAM OR CREAMERS, sugar ok) regular and decaf                             Plain Jell-O (NO RED)                                           Fruit ices (not with fruit pulp, NO RED)                                     Popsicles (NO RED)                                                               Sports drinks like Gatorade (NO RED)   You may have the following liquids until 10:15 AM DAY OF SURGERY              Drink 2 Ensure drinks AT 10:00 PM the night before surgery.        The day of surgery:  Drink ONE (1) Pre-Surgery Clear Ensure at 10:15 AM the morning of surgery. Drink in one sitting. Do not sip.  This drink was given to you during your hospital  pre-op appointment visit. Nothing  else to drink after completing the  Pre-Surgery Clear Ensure.          If you have questions, please contact your surgeon's office.   FOLLOW BOWEL PREP AND ANY ADDITIONAL PRE OP INSTRUCTIONS YOU RECEIVED FROM YOUR SURGEON'S OFFICE!!!     Oral Hygiene is also important to reduce your risk of infection.                                    Remember -  BRUSH YOUR TEETH THE MORNING OF SURGERY WITH YOUR REGULAR TOOTHPASTE  DENTURES WILL BE REMOVED PRIOR TO SURGERY PLEASE DO NOT APPLY "Poly grip" OR ADHESIVES!!!   Take these medicines the morning of surgery with A SIP OF WATER: Tylenol                              You may not have any metal on your body including hair pins, jewelry, and body piercing             Do not wear make-up, lotions, powders, perfumes/cologne, or deodorant  Do not wear nail polish including gel and S&S, artificial/acrylic nails, or any other type of covering on natural nails including finger and toenails. If you have artificial nails, gel coating, etc. that needs to be removed by a nail salon please have this removed prior to surgery or surgery may need to be canceled/ delayed if the surgeon/ anesthesia feels like they are unable to be safely monitored.   Do not shave  48 hours prior to surgery.    Do not bring valuables to the hospital. [name] IS NOT             RESPONSIBLE   FOR VALUABLES.   Contacts, glasses, dentures or bridgework may not be worn into surgery.   Bring small overnight bag day of surgery.   DO NOT BRING YOUR HOME MEDICATIONS TO THE HOSPITAL. PHARMACY WILL DISPENSE MEDICATIONS LISTED ON YOUR MEDICATION LIST TO YOU DURING YOUR ADMISSION IN THE HOSPITAL!              Please read over the following fact sheets you were given: IF YOU HAVE QUESTIONS ABOUT YOUR PRE-OP INSTRUCTIONS PLEASE CALL [name]    If you received a COVID test during your pre-op visit  it is requested that you wear a mask when out in public, stay away from anyone  that may not be feeling well and notify your surgeon if you develop symptoms. If you test positive for Covid or have been in contact with anyone that has tested positive in the last 10 days please notify you surgeon.    [place] - Preparing for Surgery Before surgery, you can play an important role.  Because skin is not sterile, your skin needs to be as free of germs as possible.  You can reduce the number of germs on your skin by washing with CHG (chlorahexidine gluconate) soap before surgery.  CHG is an antiseptic cleaner which kills germs and bonds with the skin to continue killing germs even after washing. Please DO NOT use if you have an allergy to CHG or antibacterial soaps.  If your skin becomes reddened/irritated stop using the CHG and inform your nurse when you arrive at Short Stay. Do not shave (including legs and underarms) for at least 48 hours prior to the first CHG shower.  You may shave your face/neck.  Please follow these instructions carefully:  1.  Shower with CHG Soap the night before surgery and the  morning of surgery.  2.  If you choose to wash your hair, wash your hair first as usual with your normal  shampoo.  3.  After you shampoo, rinse your hair and body thoroughly to remove the shampoo.                             4.  Use  CHG as you would any other liquid soap.  You can apply chg directly to the skin and wash.  Gently with a scrungie or clean washcloth.  5.  Apply the CHG Soap to your body ONLY FROM THE NECK DOWN.   Do   not use on face/ open                           Wound or open sores. Avoid contact with eyes, ears mouth and   genitals (private parts).                       Wash face,  Genitals (private parts) with your normal soap.             6.  Wash thoroughly, paying special attention to the area where your    surgery  will be performed.  7.  Thoroughly rinse your body with warm water from the neck down.  8.  DO NOT shower/wash with your normal soap after using  and rinsing off the CHG Soap.                9.  Pat yourself dry with a clean towel.            10.  Wear clean pajamas.            11.  Place clean sheets on your bed the night of your first shower and do not  sleep with pets. Day of Surgery : Do not apply any lotions/deodorants the morning of surgery.  Please wear clean clothes to the hospital/surgery center.  FAILURE TO FOLLOW THESE INSTRUCTIONS MAY RESULT IN THE CANCELLATION OF YOUR SURGERY  PATIENT SIGNATURE_________________________________  NURSE SIGNATURE__________________________________  ________________________________________________________________________  Rogelia Mire  An incentive spirometer is a tool that can help keep your lungs clear and active. This tool measures how well you are filling your lungs with each breath. Taking long deep breaths may help reverse or decrease the chance of developing breathing (pulmonary) problems (especially infection) following: A long period of time when you are unable to move or be active. BEFORE THE PROCEDURE  If the spirometer includes an indicator to show your best effort, your nurse or respiratory therapist will set it to a desired goal. If possible, sit up straight or lean slightly forward. Try not to slouch. Hold the incentive spirometer in an upright position. INSTRUCTIONS FOR USE  Sit on the edge of your bed if possible, or sit up as far as you can in bed or on a chair. Hold the incentive spirometer in an upright position. Breathe out normally. Place the mouthpiece in your mouth and seal your lips tightly around it. Breathe in slowly and as deeply as possible, raising the piston or the ball toward the top of the column. Hold your breath for 3-5 seconds or for as long as possible. Allow the piston or ball to fall to the bottom of the column. Remove the mouthpiece from your mouth and breathe out normally. Rest for a few seconds and repeat Steps 1 through 7 at least 10  times every 1-2 hours when you are awake. Take your time and take a few normal breaths between deep breaths. The spirometer may include an indicator to show your best effort. Use the indicator as a goal to work toward during each repetition. After each set of 10 deep breaths, practice coughing to be sure your lungs are clear.  If you have an incision (the cut made at the time of surgery), support your incision when coughing by placing a pillow or rolled up towels firmly against it. Once you are able to get out of bed, walk around indoors and cough well. You may stop using the incentive spirometer when instructed by your caregiver.  RISKS AND COMPLICATIONS Take your time so you do not get dizzy or light-headed. If you are in pain, you may need to take or ask for pain medication before doing incentive spirometry. It is harder to take a deep breath if you are having pain. AFTER USE Rest and breathe slowly and easily. It can be helpful to keep track of a log of your progress. Your caregiver can provide you with a simple table to help with this. If you are using the spirometer at home, follow these instructions: SEEK MEDICAL CARE IF:  You are having difficultly using the spirometer. You have trouble using the spirometer as often as instructed. Your pain medication is not giving enough relief while using the spirometer. You develop fever of 100.5 F (38.1 C) or higher. SEEK IMMEDIATE MEDICAL CARE IF:  You cough up bloody sputum that had not been present before. You develop fever of 102 F (38.9 C) or greater. You develop worsening pain at or near the incision site. MAKE SURE YOU:  Understand these instructions. Will watch your condition. Will get help right away if you are not doing well or get worse. Document Released: 01/02/2007 Document Revised: 11/14/2011 Document Reviewed: 03/05/2007 Exit[employer] Patient Information 2014 [employer],  [place].   ________________________________________________________________________  WHAT IS A BLOOD TRANSFUSION? Blood Transfusion Information  A transfusion is the replacement of blood or some of its parts. Blood is made up of multiple cells which provide different functions. Red blood cells carry oxygen and are used for blood loss replacement. White blood cells fight against infection. Platelets control bleeding. Plasma helps clot blood. Other blood products are available for specialized needs, such as hemophilia or other clotting disorders. BEFORE THE TRANSFUSION  Who gives blood for transfusions?  Healthy volunteers who are fully evaluated to make sure their blood is safe. This is blood bank blood. Transfusion therapy is the safest it has ever been in the practice of medicine. Before blood is taken from a donor, a complete history is taken to make sure that person has no history of diseases nor engages in risky social behavior (examples are intravenous drug use or sexual activity with multiple partners). The donor's travel history is screened to minimize risk of transmitting infections, such as malaria. The donated blood is tested for signs of infectious diseases, such as HIV and hepatitis. The blood is then tested to be sure it is compatible with you in order to minimize the chance of a transfusion reaction. If you or a relative donates blood, this is often done in anticipation of surgery and is not appropriate for emergency situations. It takes many days to process the donated blood. RISKS AND COMPLICATIONS Although transfusion therapy is very safe and saves many lives, the main dangers of transfusion include:  Getting an infectious disease. Developing a transfusion reaction. This is an allergic reaction to something in the blood you were given. Every precaution is taken to prevent this. The decision to have a blood transfusion has been considered carefully by your caregiver before blood is  given. Blood is not given unless the benefits outweigh the risks. AFTER THE TRANSFUSION Right after receiving a blood transfusion, you will usually  feel much better and more energetic. This is especially true if your red blood cells have gotten low (anemic). The transfusion raises the level of the red blood cells which carry oxygen, and this usually causes an energy increase. The nurse administering the transfusion will monitor you carefully for complications. HOME CARE INSTRUCTIONS  No special instructions are needed after a transfusion. You may find your energy is better. Speak with your caregiver about any limitations on activity for underlying diseases you may have. SEEK MEDICAL CARE IF:  Your condition is not improving after your transfusion. You develop redness or irritation at the intravenous (IV) site. SEEK IMMEDIATE MEDICAL CARE IF:  Any of the following symptoms occur over the next 12 hours: Shaking chills. You have a temperature by mouth above 102 F (38.9 C), not controlled by medicine. Chest, back, or muscle pain. People around you feel you are not acting correctly or are confused. Shortness of breath or difficulty breathing. Dizziness and fainting. You get a rash or develop hives. You have a decrease in urine output. Your urine turns a dark color or changes to pink, red, or brown. Any of the following symptoms occur over the next 10 days: You have a temperature by mouth above 102 F (38.9 C), not controlled by medicine. Shortness of breath. Weakness after normal activity. The white part of the eye turns yellow (jaundice). You have a decrease in the amount of urine or are urinating less often. Your urine turns a dark color or changes to pink, red, or brown. Document Released: 08/19/2000 Document Revised: 11/14/2011 Document Reviewed: 04/07/2008 [facility] Patient Information 2014 [place], [place].  _______________________________________________________________________

## 2023-02-17 ENCOUNTER — Ambulatory Visit (HOSPITAL_COMMUNITY)
Admission: RE | Admit: 2023-02-17 | Discharge: 2023-02-17 | Disposition: A | Discharge status: Home / Self Care | Payer: 59 | Source: Ambulatory Visit | Attending: Nurse Practitioner | Admitting: Nurse Practitioner

## 2023-02-17 ENCOUNTER — Other Ambulatory Visit: Payer: Self-pay

## 2023-02-17 ENCOUNTER — Encounter (HOSPITAL_COMMUNITY)
Admission: RE | Admit: 2023-02-17 | Discharge: 2023-02-17 | Disposition: A | Discharge status: Home / Self Care | Payer: 59 | Source: Ambulatory Visit | Attending: Surgery | Admitting: Surgery

## 2023-02-17 ENCOUNTER — Encounter (HOSPITAL_COMMUNITY): Payer: Self-pay

## 2023-02-17 VITALS — BP 108/70 | HR 80 | Temp 98.4°F | Resp 16 | Ht 66.0 in | Wt 163.0 lb

## 2023-02-17 DIAGNOSIS — Z5986 Financial insecurity: Secondary | ICD-10-CM | POA: Insufficient documentation

## 2023-02-17 DIAGNOSIS — K9400 Colostomy complication, unspecified: Secondary | ICD-10-CM | POA: Diagnosis not present

## 2023-02-17 DIAGNOSIS — Z560 Unemployment, unspecified: Secondary | ICD-10-CM | POA: Insufficient documentation

## 2023-02-17 DIAGNOSIS — K9419 Other complications of enterostomy: Secondary | ICD-10-CM | POA: Insufficient documentation

## 2023-02-17 DIAGNOSIS — R739 Hyperglycemia, unspecified: Secondary | ICD-10-CM

## 2023-02-17 DIAGNOSIS — K9409 Other complications of colostomy: Secondary | ICD-10-CM | POA: Insufficient documentation

## 2023-02-17 DIAGNOSIS — Z01818 Encounter for other preprocedural examination: Secondary | ICD-10-CM

## 2023-02-17 HISTORY — DX: Headache, unspecified: R51.9

## 2023-02-17 HISTORY — DX: Unspecified osteoarthritis, unspecified site: M19.90

## 2023-02-17 LAB — TYPE AND SCREEN
ABO/RH(D): A POS
Antibody Screen: NEGATIVE

## 2023-02-17 LAB — CBC
HCT: 43.2 % (ref 36.0–46.0)
Hemoglobin: 13.9 g/dL (ref 12.0–15.0)
MCH: 30.4 pg (ref 26.0–34.0)
MCHC: 32.2 g/dL (ref 30.0–36.0)
MCV: 94.5 fL (ref 80.0–100.0)
Platelets: 186 10*3/uL (ref 150–400)
RBC: 4.57 MIL/uL (ref 3.87–5.11)
RDW: 13.5 % (ref 11.5–15.5)
WBC: 6.6 10*3/uL (ref 4.0–10.5)
nRBC: 0 % (ref 0.0–0.2)

## 2023-02-17 LAB — HEMOGLOBIN A1C
Hgb A1c MFr Bld: 5 % (ref 4.8–5.6)
Mean Plasma Glucose: 96.8 mg/dL

## 2023-02-17 NOTE — Consult Note (Addendum)
WOC Nurse requested for preoperative stoma site marking. Pt already has a colostomy in place to left abd.  Mark placed for possible ileostomy to right abd.  Examined patient sitting and standing in order to place the marking in the patient's visual field, away from any creases or abdominal contour issues and within the rectus muscle.    Marked for ileostomy in the RLQ  __7__cm to the right of the umbilicus and  __4__ cm above the umbilicus.  Patient's abdomen cleansed with CHG wipes at site markings, allowed to air dry prior to marking. Covered mark with thin film transparent dressing to preserve mark until date of surgery. Provided with marking pen and told to re-color in if mark begins to fade prior to surgery.   WOC Nurse team will follow up with patient after surgery for continued ostomy care and teaching.  Please re-consult if further assistance is needed.  Thank-you,  Cammie Mcgee MSN, RN, CWOCN, [place], CNS [phone]

## 2023-02-17 NOTE — Discharge Instructions (Signed)
Pouches provided today See back as needed

## 2023-02-17 NOTE — Progress Notes (Signed)
[facility]   Reason for visit:  LLQ prolapsed stoma, many life stressors.  Has lost job, still has her Social research officer, government.  No resources to pay co pay for supplies. I will provide her some samples today to get through   She is hopeful for reversal next month, she reports.  HPI:   Past Medical History:  Diagnosis Date   Bipolar disorder (HCC)    Colostomy status (HCC)    Deviated septum    Diverticulitis of colon with perforation    Fistula of intestine    Family History  Problem Relation Age of Onset   Colon cancer Neg Hx    Allergies  Allergen Reactions   Other Other (See Comments)    Diverticulitis- NO FOODS WITH SEEDS, NUTS, POPCORN!!   Sulfa Antibiotics Other (See Comments)    Headaches    Current Outpatient Medications  Medication Sig Dispense Refill Last Dose   acetaminophen (TYLENOL) 500 MG tablet Take 500 mg by mouth every 6 (six) hours as needed for mild pain or moderate pain.      Cholecalciferol (VITAMIN D3) 10 MCG (400 UNIT) tablet Take 400 Units by mouth daily.      Coenzyme Q10 (CO Q-10) 100 MG CAPS Take 100 mg by mouth 2 (two) times daily.      COLLAGEN PO Take 3 g by mouth daily.      Cyanocobalamin (VITAMIN B-12 PO) Take 2,500 mcg by mouth daily.      levocetirizine (XYZAL) 5 MG tablet Take 5 mg by mouth every evening.      Magnesium 250 MG TABS Take 250 mg by mouth in the morning.      Misc Natural Products (BEET ROOT PO) Take 1,000 mg by mouth daily.      Multiple Vitamin (MULTIVITAMIN) tablet Take 1 tablet by mouth daily.      OVER THE COUNTER MEDICATION Take 1 tablet by mouth daily. Brain & Memory Performance      Potassium 99 MG TABS Take 99 mg by mouth daily.      Sodium Chloride Flush (NORMAL SALINE FLUSH) 0.9 % SOLN Inject 10 MLs by intracatheter daily (Patient not taking: Reported on 02/15/2023) 300 mL 2    sodium chloride flush 0.9 % SOLN injection Flush with 10 mLs by Intracatheter route daily. (Patient not taking: Reported on  12/29/2022) 300 mL 2    Turmeric 500 MG CAPS Take 500 mg by mouth daily.      vitamin E 180 MG (400 UNITS) capsule Take 400 Units by mouth daily.      No current facility-administered medications for this encounter.   ROS  Review of Systems  Gastrointestinal:        LLQ loop colostomy Stomal prolapse  Skin:  Positive for color change.  Psychiatric/Behavioral: Negative.         Current stressors include loss of job, housing insecurity, inability to afford ostomy supplies.   All other systems reviewed and are negative.  Vital signs:  BP 119/78 (BP Location: Right Arm)   Pulse 86   Temp 97.8 F (36.6 C) (Oral)   Resp 20   SpO2 96%  Exam:  Physical Exam Vitals reviewed.  Constitutional:      Appearance: Normal appearance.  Abdominal:     Palpations: Abdomen is soft.  Skin:    General: Skin is warm and dry.     Findings: Erythema present.  Neurological:     Mental Status: She is alert and oriented  to person, place, and time.  Psychiatric:        Mood and Affect: Mood normal.        Behavior: Behavior normal.     Stoma type/location:  LLQ prolapsed loop colostomy, extends 12 cm today.  Stomal assessment/size:  2"  Peristomal assessment:  skin intact Treatment options for stomal/peristomal skin: Must have a belt to support weight of prolapse.  Will put in 2" light convex pouch with belt.  I provide 12 additional pouches today.  Output: Liquid green stool Ostomy pouching: 1pc.light convex with belt.   Education provided:  I provide her with the supplies I have on hand.  She prefers a different pouch that I do not have available.  She will make this work, she states    Impression/dx  Prolapsed colostomy Discussion  Pouch change performed.  Samples given.  Must wear ostomy belt to support weight of prolapsed stoma.  We apply sugar to her stoma to reduce it for pouching.  I demonstrate this temporary intervention to aid in pouching. I discuss cutting the opening on the pouch  as large as it will go to allow for the movement in and out to avoid trauma to stoma.  Plan  See back as needed.     Visit time: 45 minutes.   Maple Hudson FNP-BC

## 2023-02-20 IMAGING — CT CT ABD-PELV W/ CM
2 of 5 series · 14 of 46 positions shown, 16 images · IV contrast (APPLIED)
Comparison: CT January 26, 2022

CLINICAL DATA: History of abscess due to diverticulitis. Leaking
wound related to prior transgluteal drain.

EXAM:
CT ABDOMEN AND PELVIS WITH CONTRAST
TECHNIQUE: Multidetector CT imaging of the abdomen and pelvis was performed
using the standard protocol following bolus administration of
intravenous contrast.

[Series 2: axial st · axial · 0.74mm/px · z∈[-639,-244]mm · 11 of 91 slices shown, 13 images]
[im 6/91  soft-tissue]
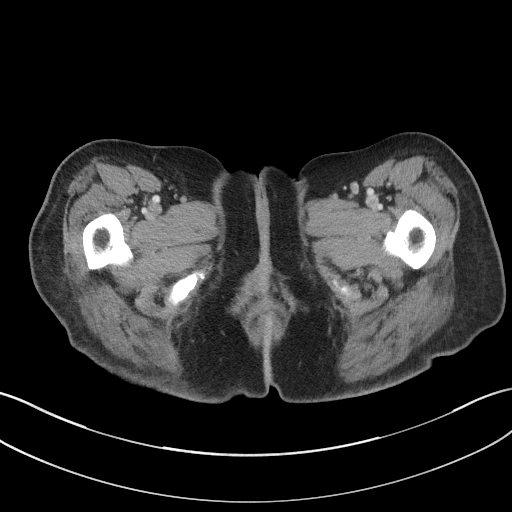
[im 6/91  bone]
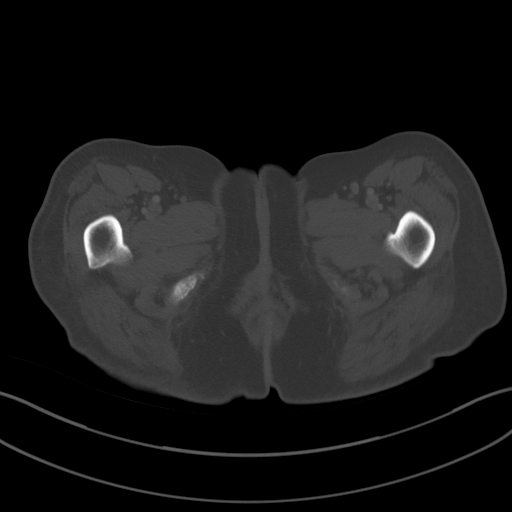
[im 17/91  soft-tissue]
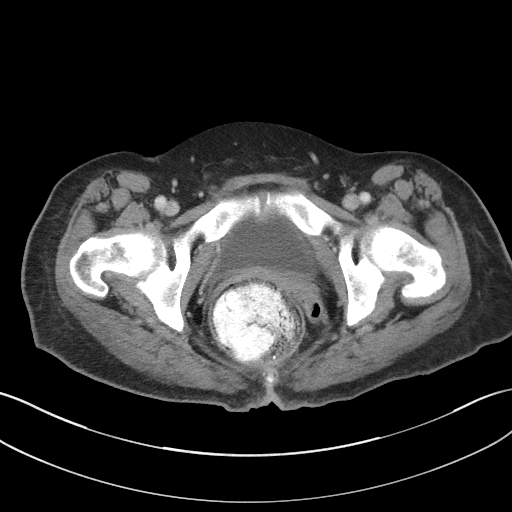
[im 23/91  soft-tissue]
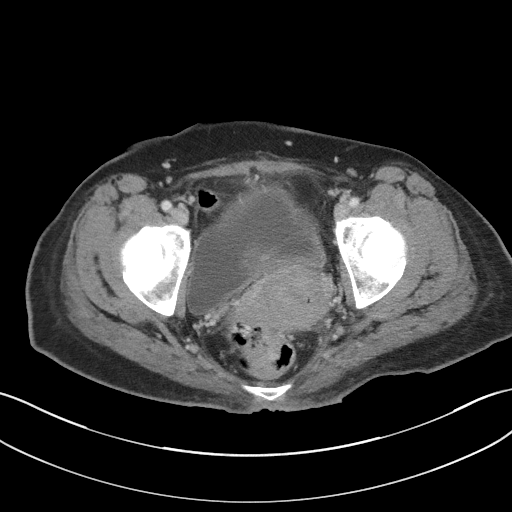
[im 29/91  soft-tissue]
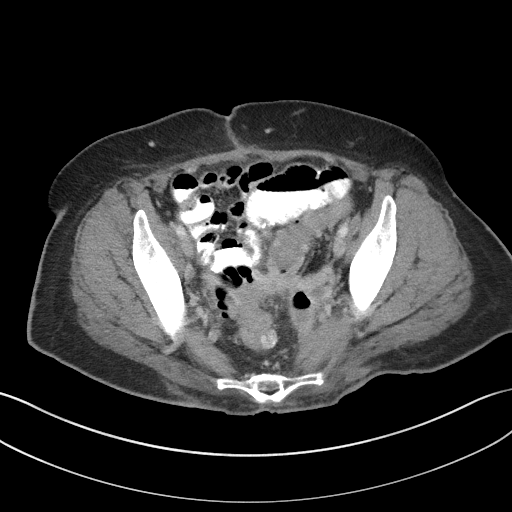
[im 40/91  soft-tissue]
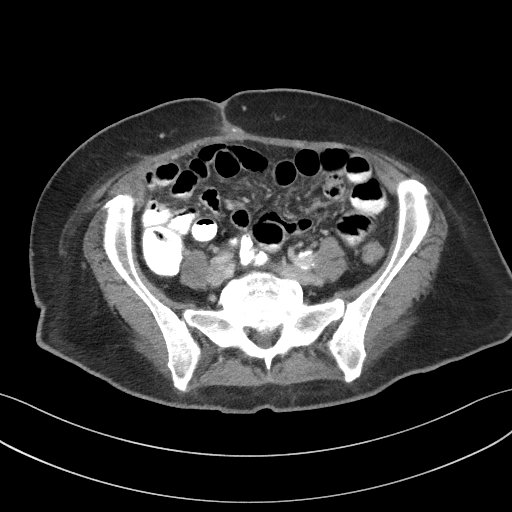
[im 46/91  soft-tissue]
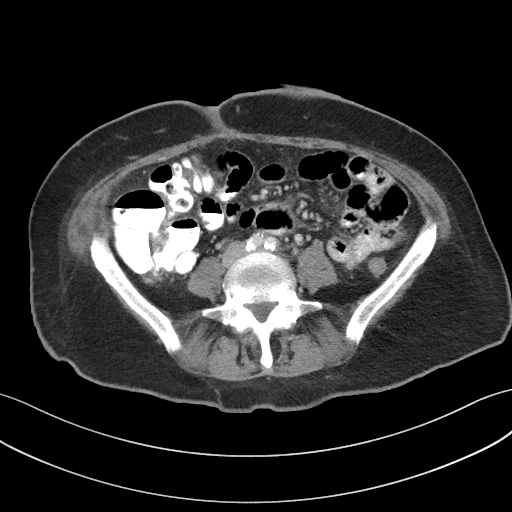
[im 51/91  soft-tissue]
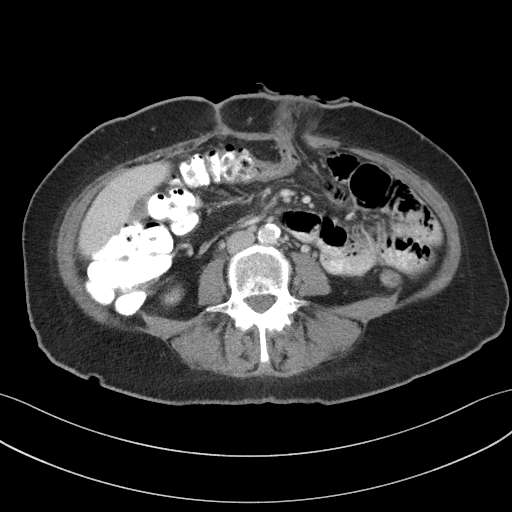
[im 62/91  soft-tissue]
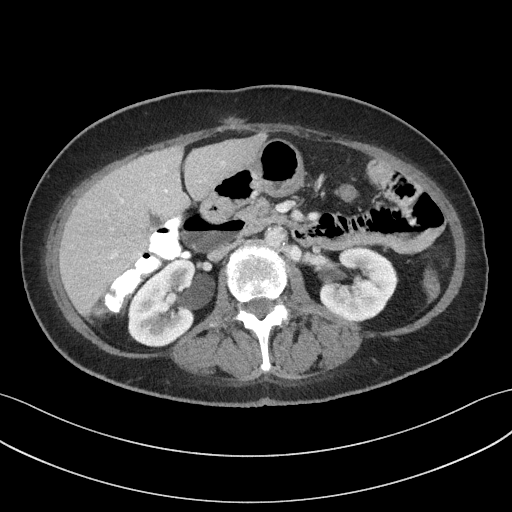
[im 68/91  soft-tissue]
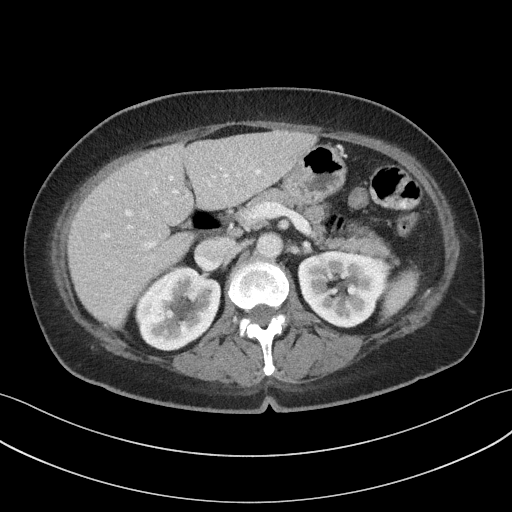
[im 68/91  bone]
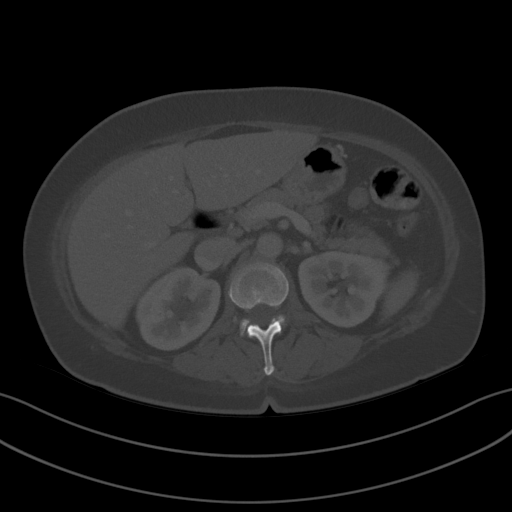
[im 74/91  soft-tissue]
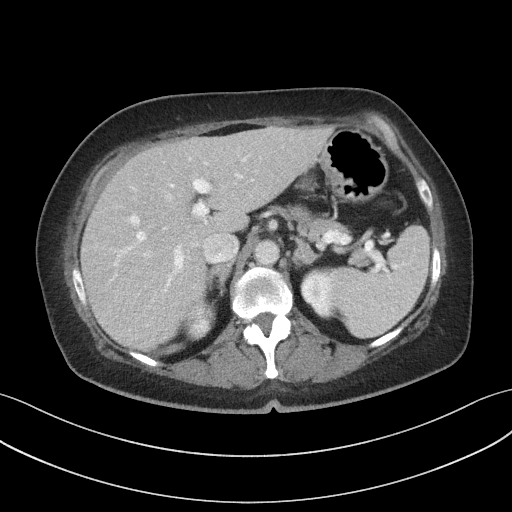
[im 85/91  soft-tissue]
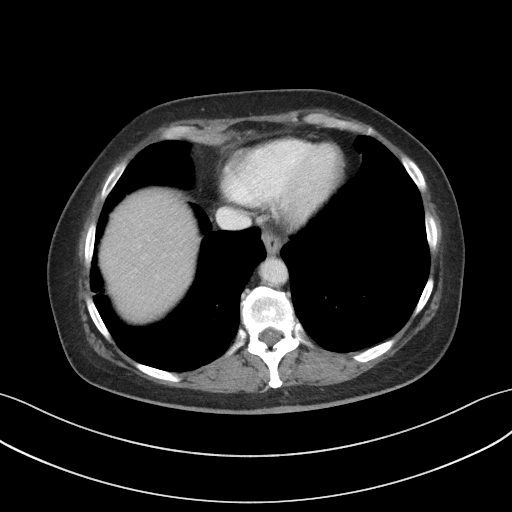

[Series 5: coronal st · coronal · 0.68mm/px · 3 of 84 slices shown]
[im 28/84  soft-tissue]
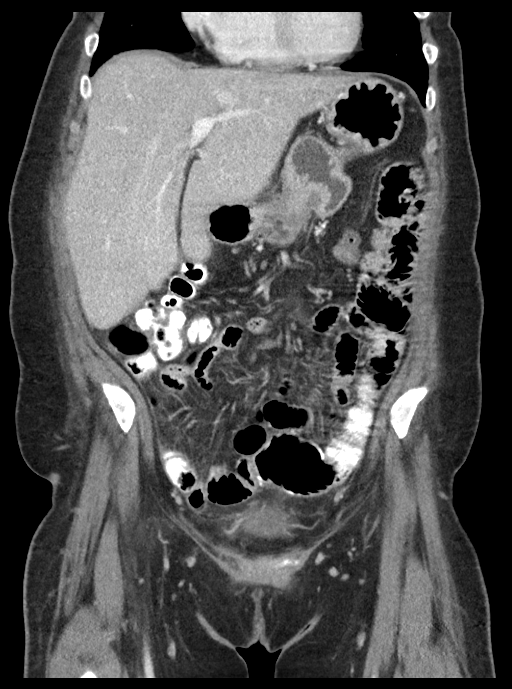
[im 37/84  soft-tissue]
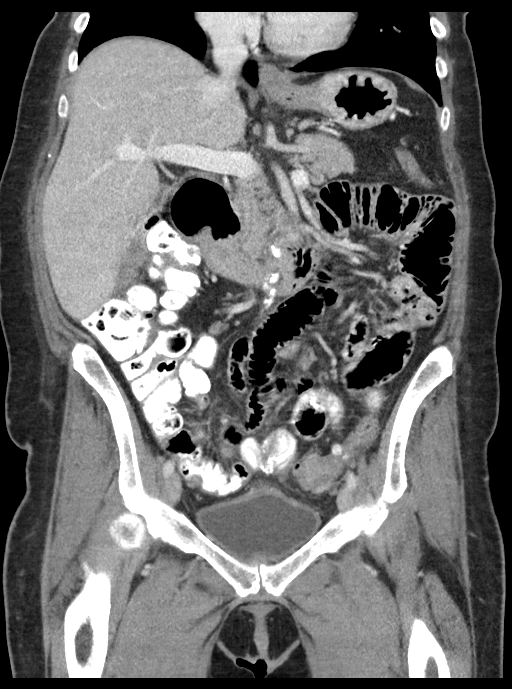
[im 47/84  soft-tissue]
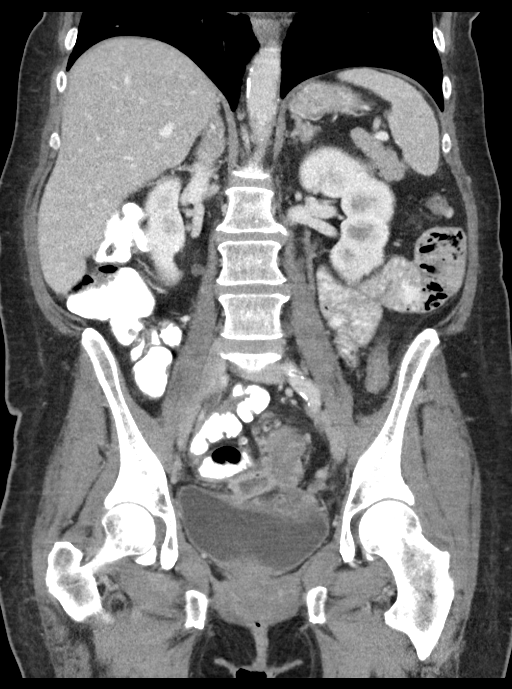

[14 of 46 positions shown; findings below may reference images not displayed]

RADIATION DOSE REDUCTION: This exam was performed according to the
departmental dose-optimization program which includes automated
exposure control, adjustment of the mA and/or kV according to
patient size and/or use of iterative reconstruction technique.

CONTRAST:  100mL OMNIPAQUE IOHEXOL 300 MG/ML  SOLN
FINDINGS: Lower chest: No acute abnormality.

Hepatobiliary: No suspicious hepatic lesion. Gallbladder is
unremarkable. No biliary ductal dilation.

Pancreas: No pancreatic ductal dilation or evidence of acute
inflammation.

Spleen: No splenomegaly or focal splenic lesion.

Adrenals/Urinary Tract: Bilateral adrenal glands are within normal
limits. No hydronephrosis. Kidneys demonstrate symmetric enhancement
and excretion of contrast material. Urinary bladder is unremarkable.

Stomach/Bowel: Radiopaque enteric contrast material traverses the
rectum. Stomach is unremarkable for degree of distension. Large
duodenal diverticulum. No pathologic dilation of small or large
bowel. Left anterior abdominal wall transverse colonic loop
colostomy.

Left-sided colonic diverticulosis. Significant interval increase in
the pelvic inflammation related to acute on chronic diverticulitis,
which makes delineation of anatomy within the pelvis difficult.
Within this context there appear to be multiple small areas of gas
and/or gas and fluid collecting and inspissated throughout the
pelvic structures for instance a 3 x 1.9 cm collection in the
midline pelvis on image 66/2 and another on sagittal image 50/6
which measures approximally 4.3 by 1.1 cm, which appear to
correspond with areas contained within a large pelvic abscess on CT
December 21, 2021. Left paramedian posterior gluteal approach drainage
catheter tract demonstrates a thick wall with enhancement of the
tract which contains fluid and gas and extends into a thick walled
fluid collection with sits on top of the uterus measuring
approximally 4.5 by 3.5 cm on image 64/2. There is no radiopaque
contrast identified within these collections or within the
peritoneum to suggest active bowel leak.

Vascular/Lymphatic: Aortic atherosclerosis without aneurysmal
dilation. No pathologically enlarged abdominal or pelvic lymph
nodes.

Reproductive: Gas within the uterus and vagina suspicious for
fistula.

Other: Prior midline incision.

Musculoskeletal: Multilevel degenerative changes spine.
IMPRESSION: 1. Significant interval increase in the pelvic inflammation related
to acute on chronic sigmoid colonic diverticulitis, with delineation
of anatomy and gas/fluid collections within the pelvis made
difficult by this. Within this context:
2. There are multifocal tiny areas of gas and/or gas and fluid
collections inspissated through the pelvic structures which appear
to correspond with a large pelvic abscess seen on prior CT, which
likely reflect small walled-off residual loculations from the larger
previously drained abscess.
3. Left paramedian posterior gluteal approach drainage catheter
tract demonstrates a thick wall with enhancement of the tract which
contains fluid and gas that extends into a 4.5 x 3.5 cm periuterine
left posterior pelvic abscess.
4. No convincing evidence of active bowel leak into these
collections or the peritoneum.
5. Gas within the uterus and vagina is suspicious for fistula
finding given the extent of inflammation in location of abscesses.

## 2023-02-21 DIAGNOSIS — B9789 Other viral agents as the cause of diseases classified elsewhere: Secondary | ICD-10-CM | POA: Diagnosis not present

## 2023-02-21 DIAGNOSIS — R0982 Postnasal drip: Secondary | ICD-10-CM | POA: Diagnosis not present

## 2023-02-21 DIAGNOSIS — J019 Acute sinusitis, unspecified: Secondary | ICD-10-CM | POA: Diagnosis not present

## 2023-02-21 DIAGNOSIS — R051 Acute cough: Secondary | ICD-10-CM | POA: Diagnosis not present

## 2023-03-01 ENCOUNTER — Inpatient Hospital Stay (HOSPITAL_COMMUNITY): Payer: 59

## 2023-03-01 ENCOUNTER — Other Ambulatory Visit: Payer: Self-pay

## 2023-03-01 ENCOUNTER — Encounter (HOSPITAL_COMMUNITY): Payer: Self-pay | Admitting: Surgery

## 2023-03-01 ENCOUNTER — Inpatient Hospital Stay (HOSPITAL_COMMUNITY): Payer: 59 | Admitting: Physician Assistant

## 2023-03-01 ENCOUNTER — Encounter (HOSPITAL_COMMUNITY)
Admission: RE | Disposition: A | Discharge status: Home / Self Care | Payer: Self-pay | Source: Home / Self Care | Attending: Surgery

## 2023-03-01 ENCOUNTER — Inpatient Hospital Stay (HOSPITAL_COMMUNITY): Payer: 59 | Admitting: Anesthesiology

## 2023-03-01 ENCOUNTER — Inpatient Hospital Stay (HOSPITAL_COMMUNITY)
Admission: RE | Admit: 2023-03-01 | Discharge: 2023-03-04 | DRG: 330 | Disposition: A | Discharge status: Home / Self Care | Payer: 59 | Attending: Surgery | Admitting: Surgery

## 2023-03-01 DIAGNOSIS — K5720 Diverticulitis of large intestine with perforation and abscess without bleeding: Secondary | ICD-10-CM | POA: Diagnosis not present

## 2023-03-01 DIAGNOSIS — F419 Anxiety disorder, unspecified: Secondary | ICD-10-CM | POA: Diagnosis not present

## 2023-03-01 DIAGNOSIS — Z87891 Personal history of nicotine dependence: Secondary | ICD-10-CM | POA: Diagnosis not present

## 2023-03-01 DIAGNOSIS — Z8719 Personal history of other diseases of the digestive system: Secondary | ICD-10-CM | POA: Diagnosis not present

## 2023-03-01 DIAGNOSIS — K37 Unspecified appendicitis: Secondary | ICD-10-CM | POA: Diagnosis not present

## 2023-03-01 DIAGNOSIS — Z882 Allergy status to sulfonamides status: Secondary | ICD-10-CM | POA: Diagnosis not present

## 2023-03-01 DIAGNOSIS — Z433 Encounter for attention to colostomy: Secondary | ICD-10-CM | POA: Diagnosis not present

## 2023-03-01 DIAGNOSIS — F3131 Bipolar disorder, current episode depressed, mild: Secondary | ICD-10-CM | POA: Diagnosis not present

## 2023-03-01 DIAGNOSIS — K5669 Other partial intestinal obstruction: Secondary | ICD-10-CM | POA: Diagnosis not present

## 2023-03-01 DIAGNOSIS — F17210 Nicotine dependence, cigarettes, uncomplicated: Secondary | ICD-10-CM | POA: Diagnosis not present

## 2023-03-01 DIAGNOSIS — K5651 Intestinal adhesions [bands], with partial obstruction: Secondary | ICD-10-CM | POA: Diagnosis not present

## 2023-03-01 DIAGNOSIS — Z8249 Family history of ischemic heart disease and other diseases of the circulatory system: Secondary | ICD-10-CM

## 2023-03-01 DIAGNOSIS — Z933 Colostomy status: Secondary | ICD-10-CM | POA: Diagnosis not present

## 2023-03-01 DIAGNOSIS — K5730 Diverticulosis of large intestine without perforation or abscess without bleeding: Secondary | ICD-10-CM

## 2023-03-01 DIAGNOSIS — F319 Bipolar disorder, unspecified: Secondary | ICD-10-CM | POA: Diagnosis present

## 2023-03-01 DIAGNOSIS — K6389 Other specified diseases of intestine: Secondary | ICD-10-CM | POA: Diagnosis present

## 2023-03-01 DIAGNOSIS — K631 Perforation of intestine (nontraumatic): Secondary | ICD-10-CM | POA: Diagnosis present

## 2023-03-01 DIAGNOSIS — K565 Intestinal adhesions [bands], unspecified as to partial versus complete obstruction: Secondary | ICD-10-CM | POA: Diagnosis not present

## 2023-03-01 DIAGNOSIS — Z79899 Other long term (current) drug therapy: Secondary | ICD-10-CM

## 2023-03-01 DIAGNOSIS — K435 Parastomal hernia without obstruction or  gangrene: Secondary | ICD-10-CM | POA: Diagnosis not present

## 2023-03-01 DIAGNOSIS — N8110 Cystocele, unspecified: Secondary | ICD-10-CM | POA: Diagnosis not present

## 2023-03-01 DIAGNOSIS — K632 Fistula of intestine: Secondary | ICD-10-CM | POA: Diagnosis present

## 2023-03-01 DIAGNOSIS — Z08 Encounter for follow-up examination after completed treatment for malignant neoplasm: Secondary | ICD-10-CM | POA: Diagnosis not present

## 2023-03-01 DIAGNOSIS — K5732 Diverticulitis of large intestine without perforation or abscess without bleeding: Secondary | ICD-10-CM | POA: Diagnosis not present

## 2023-03-01 DIAGNOSIS — M1990 Unspecified osteoarthritis, unspecified site: Secondary | ICD-10-CM | POA: Diagnosis not present

## 2023-03-01 HISTORY — PX: FLEXIBLE SIGMOIDOSCOPY: SHX5431

## 2023-03-01 HISTORY — PX: XI ROBOTIC ASSISTED SMALL BOWEL RESECTION: SHX6872

## 2023-03-01 HISTORY — DX: Diverticulitis of large intestine with perforation and abscess without bleeding: K57.20

## 2023-03-01 SURGERY — EXCISION, SMALL INTESTINE, ROBOT-ASSISTED
Anesthesia: General

## 2023-03-01 MED ORDER — ORAL CARE MOUTH RINSE: 15.0000 mL | Freq: Once | OROMUCOSAL | Status: AC

## 2023-03-01 MED ORDER — MAGIC MOUTHWASH: 15.0000 mL | Freq: Four times a day (QID) | ORAL | Status: DC | PRN

## 2023-03-01 MED ORDER — CHOLECALCIFEROL 10 MCG (400 UNIT) PO TABS
400.0000 [IU] | ORAL_TABLET | Freq: Every day | ORAL | Status: DC
  Administered 2023-03-02 – 2023-03-04 (×3): 400 [IU] via ORAL
  Filled 2023-03-01 (×3): qty 1

## 2023-03-01 MED ORDER — STERILE WATER FOR INJECTION IJ SOLN
INTRAMUSCULAR | Status: DC | PRN
  Administered 2023-03-01: 15 mL via INTRAVESICAL

## 2023-03-01 MED ORDER — LIDOCAINE HCL (PF) 2 % IJ SOLN
INTRAMUSCULAR | Status: AC
  Filled 2023-03-01: qty 10

## 2023-03-01 MED ORDER — CALCIUM POLYCARBOPHIL 625 MG PO TABS
625.0000 mg | ORAL_TABLET | Freq: Two times a day (BID) | ORAL | Status: DC
  Administered 2023-03-01 – 2023-03-04 (×6): 625 mg via ORAL
  Filled 2023-03-01 (×6): qty 1

## 2023-03-01 MED ORDER — BUPIVACAINE LIPOSOME 1.3 % IJ SUSP
INTRAMUSCULAR | Status: DC | PRN
  Administered 2023-03-01: 20 mL

## 2023-03-01 MED ORDER — HYDROMORPHONE HCL 1 MG/ML IJ SOLN
0.2500 mg | INTRAMUSCULAR | Status: DC | PRN
  Administered 2023-03-01: 0.25 mg via INTRAVENOUS

## 2023-03-01 MED ORDER — SODIUM CHLORIDE 0.9 % IV SOLN: 250.0000 mL | INTRAVENOUS | Status: DC | PRN

## 2023-03-01 MED ORDER — INDOCYANINE GREEN 25 MG IV SOLR
INTRAVENOUS | Status: DC | PRN
  Administered 2023-03-01: 2.5 mg via INTRAVENOUS

## 2023-03-01 MED ORDER — LACTATED RINGERS IV BOLUS: 1000.0000 mL | Freq: Three times a day (TID) | INTRAVENOUS | Status: AC | PRN

## 2023-03-01 MED ORDER — MELATONIN 3 MG PO TABS
3.0000 mg | ORAL_TABLET | Freq: Every evening | ORAL | Status: DC | PRN
  Filled 2023-03-01: qty 1

## 2023-03-01 MED ORDER — MIDAZOLAM HCL 2 MG/2ML IJ SOLN
INTRAMUSCULAR | Status: AC
  Filled 2023-03-01: qty 2

## 2023-03-01 MED ORDER — HYDROMORPHONE HCL 1 MG/ML IJ SOLN
INTRAMUSCULAR | Status: DC | PRN
  Administered 2023-03-01: 1 mg via INTRAVENOUS

## 2023-03-01 MED ORDER — ALVIMOPAN 12 MG PO CAPS
12.0000 mg | ORAL_CAPSULE | Freq: Two times a day (BID) | ORAL | Status: DC
  Administered 2023-03-02 – 2023-03-03 (×4): 12 mg via ORAL
  Filled 2023-03-01 (×4): qty 1

## 2023-03-01 MED ORDER — ENOXAPARIN SODIUM 40 MG/0.4ML IJ SOSY
40.0000 mg | PREFILLED_SYRINGE | Freq: Once | INTRAMUSCULAR | Status: AC
  Administered 2023-03-01: 40 mg via SUBCUTANEOUS
  Filled 2023-03-01: qty 0.4

## 2023-03-01 MED ORDER — IOHEXOL 300 MG/ML  SOLN
INTRAMUSCULAR | Status: DC | PRN
  Administered 2023-03-01: 15 mL

## 2023-03-01 MED ORDER — SODIUM CHLORIDE 0.9% FLUSH: 3.0000 mL | INTRAVENOUS | Status: DC | PRN

## 2023-03-01 MED ORDER — DEXAMETHASONE SODIUM PHOSPHATE 10 MG/ML IJ SOLN
INTRAMUSCULAR | Status: DC | PRN
  Administered 2023-03-01: 8 mg via INTRAVENOUS

## 2023-03-01 MED ORDER — LIDOCAINE HCL (PF) 2 % IJ SOLN
INTRAMUSCULAR | Status: DC | PRN
  Administered 2023-03-01: 30 mg via INTRADERMAL

## 2023-03-01 MED ORDER — STERILE WATER FOR INJECTION IJ SOLN
INTRAMUSCULAR | Status: AC
  Filled 2023-03-01: qty 10

## 2023-03-01 MED ORDER — KETAMINE HCL 50 MG/5ML IJ SOSY
PREFILLED_SYRINGE | INTRAMUSCULAR | Status: AC
  Filled 2023-03-01: qty 5

## 2023-03-01 MED ORDER — VITAMIN E 45 MG (100 UNIT) PO CAPS
400.0000 [IU] | ORAL_CAPSULE | Freq: Every day | ORAL | Status: DC
  Administered 2023-03-02 – 2023-03-04 (×3): 400 [IU] via ORAL
  Filled 2023-03-01 (×3): qty 4

## 2023-03-01 MED ORDER — PROPOFOL 10 MG/ML IV BOLUS
INTRAVENOUS | Status: DC | PRN
  Administered 2023-03-01: 50 mg via INTRAVENOUS
  Administered 2023-03-01: 150 mg via INTRAVENOUS

## 2023-03-01 MED ORDER — LACTATED RINGERS IV SOLN: INTRAVENOUS | Status: DC

## 2023-03-01 MED ORDER — SIMETHICONE 80 MG PO CHEW: 40.0000 mg | CHEWABLE_TABLET | Freq: Four times a day (QID) | ORAL | Status: DC | PRN

## 2023-03-01 MED ORDER — MENTHOL 3 MG MT LOZG: 1.0000 | LOZENGE | OROMUCOSAL | Status: DC | PRN

## 2023-03-01 MED ORDER — KETOROLAC TROMETHAMINE 30 MG/ML IJ SOLN
30.0000 mg | Freq: Once | INTRAMUSCULAR | Status: AC | PRN
  Administered 2023-03-01: 30 mg via INTRAVENOUS

## 2023-03-01 MED ORDER — HYDROMORPHONE HCL 1 MG/ML IJ SOLN
0.5000 mg | INTRAMUSCULAR | Status: DC | PRN
  Administered 2023-03-01: 0.5 mg via INTRAVENOUS
  Administered 2023-03-02 (×3): 1 mg via INTRAVENOUS
  Administered 2023-03-02 – 2023-03-03 (×4): 2 mg via INTRAVENOUS
  Administered 2023-03-03 – 2023-03-04 (×2): 1 mg via INTRAVENOUS
  Filled 2023-03-01: qty 2
  Filled 2023-03-01: qty 1
  Filled 2023-03-01 (×2): qty 2
  Filled 2023-03-01 (×5): qty 1
  Filled 2023-03-01: qty 2

## 2023-03-01 MED ORDER — ALBUTEROL SULFATE (2.5 MG/3ML) 0.083% IN NEBU: 2.5000 mg | INHALATION_SOLUTION | Freq: Four times a day (QID) | RESPIRATORY_TRACT | Status: DC | PRN

## 2023-03-01 MED ORDER — CHLORHEXIDINE GLUCONATE 0.12 % MT SOLN
15.0000 mL | Freq: Once | OROMUCOSAL | Status: AC
  Administered 2023-03-01: 15 mL via OROMUCOSAL

## 2023-03-01 MED ORDER — BISACODYL 5 MG PO TBEC: 20.0000 mg | DELAYED_RELEASE_TABLET | Freq: Once | ORAL | Status: DC

## 2023-03-01 MED ORDER — ALVIMOPAN 12 MG PO CAPS
12.0000 mg | ORAL_CAPSULE | ORAL | Status: AC
  Administered 2023-03-01: 12 mg via ORAL
  Filled 2023-03-01: qty 1

## 2023-03-01 MED ORDER — ONDANSETRON HCL 4 MG/2ML IJ SOLN: 4.0000 mg | Freq: Four times a day (QID) | INTRAMUSCULAR | Status: DC | PRN

## 2023-03-01 MED ORDER — ONDANSETRON HCL 4 MG/2ML IJ SOLN: 4.0000 mg | Freq: Once | INTRAMUSCULAR | Status: DC | PRN

## 2023-03-01 MED ORDER — PROPOFOL 10 MG/ML IV BOLUS
INTRAVENOUS | Status: AC
  Filled 2023-03-01: qty 20

## 2023-03-01 MED ORDER — STERILE WATER FOR IRRIGATION IR SOLN
Status: DC | PRN
  Administered 2023-03-01: 1000 mL

## 2023-03-01 MED ORDER — HYDRALAZINE HCL 20 MG/ML IJ SOLN: 10.0000 mg | INTRAMUSCULAR | Status: DC | PRN

## 2023-03-01 MED ORDER — FENTANYL CITRATE (PF) 250 MCG/5ML IJ SOLN
INTRAMUSCULAR | Status: DC | PRN
  Administered 2023-03-01: 100 ug via INTRAVENOUS
  Administered 2023-03-01 (×2): 50 ug via INTRAVENOUS

## 2023-03-01 MED ORDER — SODIUM CHLORIDE 0.9 % IV SOLN
2.0000 g | Freq: Two times a day (BID) | INTRAVENOUS | Status: AC
  Administered 2023-03-01: 2 g via INTRAVENOUS
  Filled 2023-03-01: qty 2

## 2023-03-01 MED ORDER — POLYETHYLENE GLYCOL 3350 17 GM/SCOOP PO POWD: 1.0000 | Freq: Once | ORAL | Status: DC

## 2023-03-01 MED ORDER — TRAMADOL HCL 50 MG PO TABS
50.0000 mg | ORAL_TABLET | Freq: Four times a day (QID) | ORAL | Status: DC | PRN
  Administered 2023-03-01: 50 mg via ORAL
  Administered 2023-03-02 – 2023-03-03 (×2): 100 mg via ORAL
  Filled 2023-03-01: qty 2
  Filled 2023-03-01: qty 1
  Filled 2023-03-01: qty 2

## 2023-03-01 MED ORDER — DIPHENHYDRAMINE HCL 50 MG/ML IJ SOLN: 12.5000 mg | Freq: Four times a day (QID) | INTRAMUSCULAR | Status: DC | PRN

## 2023-03-01 MED ORDER — LORATADINE 10 MG PO TABS
10.0000 mg | ORAL_TABLET | Freq: Every day | ORAL | Status: DC
  Administered 2023-03-02 – 2023-03-03 (×2): 10 mg via ORAL
  Filled 2023-03-01 (×2): qty 1

## 2023-03-01 MED ORDER — ONDANSETRON HCL 4 MG PO TABS: 4.0000 mg | ORAL_TABLET | Freq: Four times a day (QID) | ORAL | Status: DC | PRN

## 2023-03-01 MED ORDER — GABAPENTIN 300 MG PO CAPS
300.0000 mg | ORAL_CAPSULE | ORAL | Status: AC
  Administered 2023-03-01: 300 mg via ORAL
  Filled 2023-03-01: qty 1

## 2023-03-01 MED ORDER — HYDROMORPHONE HCL 1 MG/ML IJ SOLN
INTRAMUSCULAR | Status: AC
  Administered 2023-03-01: 0.25 mg via INTRAVENOUS
  Filled 2023-03-01: qty 1

## 2023-03-01 MED ORDER — NEOMYCIN SULFATE 500 MG PO TABS: 1000.0000 mg | ORAL_TABLET | ORAL | Status: DC

## 2023-03-01 MED ORDER — ACETAMINOPHEN 500 MG PO TABS
1000.0000 mg | ORAL_TABLET | Freq: Four times a day (QID) | ORAL | Status: DC
  Administered 2023-03-01 – 2023-03-04 (×10): 1000 mg via ORAL
  Filled 2023-03-01 (×10): qty 2

## 2023-03-01 MED ORDER — OXYCODONE HCL 5 MG PO TABS: 5.0000 mg | ORAL_TABLET | Freq: Once | ORAL | Status: DC | PRN

## 2023-03-01 MED ORDER — LIDOCAINE 2% (20 MG/ML) 5 ML SYRINGE
INTRAMUSCULAR | Status: DC | PRN
  Administered 2023-03-01: 1.5 mg/kg/h via INTRAVENOUS

## 2023-03-01 MED ORDER — ROCURONIUM BROMIDE 10 MG/ML (PF) SYRINGE
PREFILLED_SYRINGE | INTRAVENOUS | Status: AC
  Filled 2023-03-01: qty 10

## 2023-03-01 MED ORDER — BUPIVACAINE-EPINEPHRINE (PF) 0.25% -1:200000 IJ SOLN
INTRAMUSCULAR | Status: DC | PRN
  Administered 2023-03-01: 50 mL

## 2023-03-01 MED ORDER — DEXAMETHASONE SODIUM PHOSPHATE 10 MG/ML IJ SOLN
INTRAMUSCULAR | Status: AC
  Filled 2023-03-01: qty 1

## 2023-03-01 MED ORDER — ONDANSETRON HCL 4 MG/2ML IJ SOLN
INTRAMUSCULAR | Status: AC
  Filled 2023-03-01: qty 2

## 2023-03-01 MED ORDER — SUGAMMADEX SODIUM 200 MG/2ML IV SOLN
INTRAVENOUS | Status: DC | PRN
  Administered 2023-03-01: 200 mg via INTRAVENOUS

## 2023-03-01 MED ORDER — ENSURE SURGERY PO LIQD
237.0000 mL | Freq: Two times a day (BID) | ORAL | Status: DC
  Administered 2023-03-02 – 2023-03-04 (×5): 237 mL via ORAL

## 2023-03-01 MED ORDER — ENSURE PRE-SURGERY PO LIQD
592.0000 mL | Freq: Once | ORAL | Status: DC
  Filled 2023-03-01: qty 592

## 2023-03-01 MED ORDER — PROCHLORPERAZINE MALEATE 10 MG PO TABS: 10.0000 mg | ORAL_TABLET | Freq: Four times a day (QID) | ORAL | Status: DC | PRN

## 2023-03-01 MED ORDER — METHOCARBAMOL 1000 MG/10ML IJ SOLN: 1000.0000 mg | Freq: Four times a day (QID) | INTRAVENOUS | Status: DC | PRN

## 2023-03-01 MED ORDER — METRONIDAZOLE 500 MG PO TABS: 1000.0000 mg | ORAL_TABLET | ORAL | Status: DC

## 2023-03-01 MED ORDER — OXYCODONE HCL 5 MG/5ML PO SOLN: 5.0000 mg | Freq: Once | ORAL | Status: DC | PRN

## 2023-03-01 MED ORDER — ALUM & MAG HYDROXIDE-SIMETH 200-200-20 MG/5ML PO SUSP: 30.0000 mL | Freq: Four times a day (QID) | ORAL | Status: DC | PRN

## 2023-03-01 MED ORDER — LEVOCETIRIZINE DIHYDROCHLORIDE 5 MG PO TABS: 5.0000 mg | ORAL_TABLET | Freq: Every evening | ORAL | Status: DC

## 2023-03-01 MED ORDER — MIDAZOLAM HCL 5 MG/5ML IJ SOLN
INTRAMUSCULAR | Status: DC | PRN
  Administered 2023-03-01: 2 mg via INTRAVENOUS

## 2023-03-01 MED ORDER — FENTANYL CITRATE (PF) 250 MCG/5ML IJ SOLN
INTRAMUSCULAR | Status: AC
  Filled 2023-03-01: qty 5

## 2023-03-01 MED ORDER — KETOROLAC TROMETHAMINE 30 MG/ML IJ SOLN
INTRAMUSCULAR | Status: AC
  Filled 2023-03-01: qty 1

## 2023-03-01 MED ORDER — PROCHLORPERAZINE EDISYLATE 10 MG/2ML IJ SOLN: 5.0000 mg | Freq: Four times a day (QID) | INTRAMUSCULAR | Status: DC | PRN

## 2023-03-01 MED ORDER — PHENOL 1.4 % MT LIQD: 2.0000 | OROMUCOSAL | Status: DC | PRN

## 2023-03-01 MED ORDER — BUPIVACAINE LIPOSOME 1.3 % IJ SUSP: 20.0000 mL | Freq: Once | INTRAMUSCULAR | Status: DC

## 2023-03-01 MED ORDER — HYDROMORPHONE HCL 2 MG/ML IJ SOLN
INTRAMUSCULAR | Status: AC
  Filled 2023-03-01: qty 1

## 2023-03-01 MED ORDER — KETAMINE HCL 10 MG/ML IJ SOLN
INTRAMUSCULAR | Status: DC | PRN
  Administered 2023-03-01: 50 mg via INTRAVENOUS

## 2023-03-01 MED ORDER — METRONIDAZOLE 500 MG/100ML IV SOLN
INTRAVENOUS | Status: AC
  Filled 2023-03-01: qty 100

## 2023-03-01 MED ORDER — ACETAMINOPHEN 500 MG PO TABS
1000.0000 mg | ORAL_TABLET | ORAL | Status: AC
  Administered 2023-03-01: 1000 mg via ORAL
  Filled 2023-03-01: qty 2

## 2023-03-01 MED ORDER — SODIUM CHLORIDE 0.9% FLUSH
3.0000 mL | Freq: Two times a day (BID) | INTRAVENOUS | Status: DC
  Administered 2023-03-02 – 2023-03-04 (×5): 3 mL via INTRAVENOUS

## 2023-03-01 MED ORDER — SODIUM CHLORIDE 0.9 % IV SOLN
2.0000 g | INTRAVENOUS | Status: AC
  Administered 2023-03-01: 2 g via INTRAVENOUS
  Filled 2023-03-01: qty 2

## 2023-03-01 MED ORDER — METHOCARBAMOL 500 MG PO TABS
1000.0000 mg | ORAL_TABLET | Freq: Four times a day (QID) | ORAL | Status: DC | PRN
  Administered 2023-03-01 – 2023-03-03 (×4): 1000 mg via ORAL
  Filled 2023-03-01 (×4): qty 2

## 2023-03-01 MED ORDER — DIPHENHYDRAMINE HCL 12.5 MG/5ML PO ELIX: 12.5000 mg | ORAL_SOLUTION | Freq: Four times a day (QID) | ORAL | Status: DC | PRN

## 2023-03-01 MED ORDER — BUPIVACAINE LIPOSOME 1.3 % IJ SUSP
INTRAMUSCULAR | Status: AC
  Filled 2023-03-01: qty 20

## 2023-03-01 MED ORDER — VITAMIN D3 10 MCG (400 UNIT) PO TABS: 400.0000 [IU] | ORAL_TABLET | Freq: Every day | ORAL | Status: DC

## 2023-03-01 MED ORDER — METOPROLOL TARTRATE 5 MG/5ML IV SOLN: 5.0000 mg | Freq: Four times a day (QID) | INTRAVENOUS | Status: DC | PRN

## 2023-03-01 MED ORDER — ROCURONIUM BROMIDE 10 MG/ML (PF) SYRINGE
PREFILLED_SYRINGE | INTRAVENOUS | Status: DC | PRN
  Administered 2023-03-01: 20 mg via INTRAVENOUS
  Administered 2023-03-01: 80 mg via INTRAVENOUS

## 2023-03-01 MED ORDER — ENOXAPARIN SODIUM 40 MG/0.4ML IJ SOSY
40.0000 mg | PREFILLED_SYRINGE | INTRAMUSCULAR | Status: DC
  Administered 2023-03-02 – 2023-03-04 (×3): 40 mg via SUBCUTANEOUS
  Filled 2023-03-01 (×3): qty 0.4

## 2023-03-01 MED ORDER — ENSURE PRE-SURGERY PO LIQD
296.0000 mL | Freq: Once | ORAL | Status: DC
  Filled 2023-03-01: qty 296

## 2023-03-01 MED ORDER — LACTATED RINGERS IR SOLN
Status: DC | PRN
  Administered 2023-03-01: 1000 mL

## 2023-03-01 MED ORDER — ONDANSETRON HCL 4 MG/2ML IJ SOLN
INTRAMUSCULAR | Status: DC | PRN
  Administered 2023-03-01: 4 mg via INTRAVENOUS

## 2023-03-01 MED ORDER — 0.9 % SODIUM CHLORIDE (POUR BTL) OPTIME
TOPICAL | Status: DC | PRN
  Administered 2023-03-01: 1000 mL

## 2023-03-01 MED ORDER — BUPIVACAINE-EPINEPHRINE 0.25% -1:200000 IJ SOLN
INTRAMUSCULAR | Status: AC
  Filled 2023-03-01: qty 1

## 2023-03-01 MED ORDER — BUPIVACAINE HCL 0.25 % IJ SOLN
INTRAMUSCULAR | Status: AC
  Filled 2023-03-01: qty 1

## 2023-03-01 SURGICAL SUPPLY — 126 items
ADAPTER GOLDBERG URETERAL (ADAPTER) IMPLANT
ADPR CATH 15X14FR FL DRN BG (ADAPTER)
APL PRP STRL LF DISP 70% ISPRP (MISCELLANEOUS)
APPLIER CLIP 5 13 M/L LIGAMAX5 (MISCELLANEOUS)
APPLIER CLIP ROT 10 11.4 M/L (STAPLE)
APR CLP MED LRG 11.4X10 (STAPLE)
APR CLP MED LRG 5 ANG JAW (MISCELLANEOUS)
BAG COUNTER SPONGE SURGICOUNT (BAG) ×1 IMPLANT
BAG SPNG CNTER NS LX DISP (BAG) ×1
BAG URO CATCHER STRL LF (MISCELLANEOUS) ×1 IMPLANT
BLADE EXTENDED COATED 6.5IN (ELECTRODE) IMPLANT
CANNULA REDUCER 12-8 DVNC XI (CANNULA) IMPLANT
CATH URETL OPEN END 6FR 70 (CATHETERS) IMPLANT
CELLS DAT CNTRL 66122 CELL SVR (MISCELLANEOUS) IMPLANT
CHLORAPREP W/TINT 26 (MISCELLANEOUS) IMPLANT
CLIP APPLIE 5 13 M/L LIGAMAX5 (MISCELLANEOUS) IMPLANT
CLIP APPLIE ROT 10 11.4 M/L (STAPLE) IMPLANT
CLOTH BEACON ORANGE TIMEOUT ST (SAFETY) ×1 IMPLANT
COVER SURGICAL LIGHT HANDLE (MISCELLANEOUS) ×2 IMPLANT
COVER TIP SHEARS 8 DVNC (MISCELLANEOUS) ×1 IMPLANT
DEVICE TROCAR PUNCTURE CLOSURE (ENDOMECHANICALS) IMPLANT
DRAIN CHANNEL 19F RND (DRAIN) IMPLANT
DRAPE ARM DVNC X/XI (DISPOSABLE) ×4 IMPLANT
DRAPE COLUMN DVNC XI (DISPOSABLE) ×1 IMPLANT
DRAPE SURG IRRIG POUCH 19X23 (DRAPES) ×1 IMPLANT
DRIVER NDL LRG 8 DVNC XI (INSTRUMENTS) ×1 IMPLANT
DRIVER NDLE LRG 8 DVNC XI (INSTRUMENTS) ×1 IMPLANT
DRSG OPSITE POSTOP 4X10 (GAUZE/BANDAGES/DRESSINGS) IMPLANT
DRSG OPSITE POSTOP 4X6 (GAUZE/BANDAGES/DRESSINGS) IMPLANT
DRSG OPSITE POSTOP 4X8 (GAUZE/BANDAGES/DRESSINGS) IMPLANT
DRSG TEGADERM 2-3/8X2-3/4 SM (GAUZE/BANDAGES/DRESSINGS) ×5 IMPLANT
DRSG TEGADERM 4X4.75 (GAUZE/BANDAGES/DRESSINGS) IMPLANT
ELECT PENCIL ROCKER SW 15FT (MISCELLANEOUS) ×1 IMPLANT
ELECT REM PT RETURN 15FT ADLT (MISCELLANEOUS) ×1 IMPLANT
ENDOLOOP SUT PDS II 0 18 (SUTURE) IMPLANT
EVACUATOR SILICONE 100CC (DRAIN) IMPLANT
GAUZE SPONGE 2X2 8PLY STRL LF (GAUZE/BANDAGES/DRESSINGS) ×1 IMPLANT
GLOVE ECLIPSE 8.0 STRL XLNG CF (GLOVE) ×3 IMPLANT
GLOVE INDICATOR 8.0 STRL GRN (GLOVE) ×3 IMPLANT
GLOVE SURG LX STRL 7.5 STRW (GLOVE) ×1 IMPLANT
GOWN SRG XL LVL 4 BRTHBL STRL (GOWNS) ×2 IMPLANT
GOWN STRL NON-REIN XL LVL4 (GOWNS) ×2
GOWN STRL REUS W/ TWL XL LVL3 (GOWN DISPOSABLE) ×4 IMPLANT
GOWN STRL REUS W/TWL XL LVL3 (GOWN DISPOSABLE) ×4
GRASPER SUT TROCAR 14GX15 (MISCELLANEOUS) IMPLANT
GRASPER TIP-UP FEN DVNC XI (INSTRUMENTS) ×1 IMPLANT
GUIDEWIRE ANG ZIPWIRE 038X150 (WIRE) IMPLANT
GUIDEWIRE STR DUAL SENSOR (WIRE) IMPLANT
HOLDER FOLEY CATH W/STRAP (MISCELLANEOUS) ×1 IMPLANT
IRRIG SUCT STRYKERFLOW 2 WTIP (MISCELLANEOUS) ×1
IRRIGATION SUCT STRKRFLW 2 WTP (MISCELLANEOUS) ×1 IMPLANT
KIT PROCEDURE DVNC SI (MISCELLANEOUS) ×1 IMPLANT
KIT SIGMOIDOSCOPE (SET/KITS/TRAYS/PACK) IMPLANT
KIT TURNOVER KIT A (KITS) IMPLANT
LIGASURE IMPACT 36 18CM CVD LR (INSTRUMENTS) IMPLANT
MANIFOLD NEPTUNE II (INSTRUMENTS) ×1 IMPLANT
NDL INSUFFLATION 14GA 120MM (NEEDLE) ×1 IMPLANT
NEEDLE INSUFFLATION 14GA 120MM (NEEDLE) ×1 IMPLANT
PACK CARDIOVASCULAR III (CUSTOM PROCEDURE TRAY) ×1 IMPLANT
PACK COLON (CUSTOM PROCEDURE TRAY) ×1 IMPLANT
PACK CYSTO (CUSTOM PROCEDURE TRAY) ×1 IMPLANT
PAD POSITIONING PINK XL (MISCELLANEOUS) ×1 IMPLANT
PROTECTOR NERVE ULNAR (MISCELLANEOUS) ×2 IMPLANT
RELOAD AUTO 90-3.5 TA90 BLE (ENDOMECHANICALS) ×1 IMPLANT
RELOAD PROXIMATE 75MM BLUE (ENDOMECHANICALS) ×1 IMPLANT
RELOAD STAPLE 45 3.5 BLU DVNC (STAPLE) IMPLANT
RELOAD STAPLE 45 4.3 GRN DVNC (STAPLE) IMPLANT
RELOAD STAPLE 60 3.5 BLU DVNC (STAPLE) IMPLANT
RELOAD STAPLE 60 4.3 GRN DVNC (STAPLE) IMPLANT
RELOAD STAPLE 75 3.8 BLU REG (ENDOMECHANICALS) IMPLANT
RELOAD STAPLE 90 BLU REG (ENDOMECHANICALS) IMPLANT
RELOAD STAPLER 3.5X45 BLU DVNC (STAPLE) IMPLANT
RELOAD STAPLER 3.5X60 BLU DVNC (STAPLE) IMPLANT
RELOAD STAPLER 4.3X45 GRN DVNC (STAPLE) IMPLANT
RELOAD STAPLER 4.3X60 GRN DVNC (STAPLE) ×2 IMPLANT
RETRACTOR WND ALEXIS 18 MED (MISCELLANEOUS) IMPLANT
RTRCTR WOUND ALEXIS 18CM MED (MISCELLANEOUS)
SCISSORS LAP 5X35 DISP (ENDOMECHANICALS) ×1 IMPLANT
SCISSORS MNPLR CVD DVNC XI (INSTRUMENTS) ×1 IMPLANT
SEAL UNIV 5-12 XI (MISCELLANEOUS) ×4 IMPLANT
SEALER VESSEL EXT DVNC XI (MISCELLANEOUS) ×1 IMPLANT
SOL ELECTROSURG ANTI STICK (MISCELLANEOUS) ×1
SOLUTION ELECTROSURG ANTI STCK (MISCELLANEOUS) ×1 IMPLANT
SPIKE FLUID TRANSFER (MISCELLANEOUS) ×1 IMPLANT
STAPLER 45 SUREFORM DVNC (STAPLE) IMPLANT
STAPLER 60 SUREFORM DVNC (STAPLE) IMPLANT
STAPLER 90 3.5 STAND SLIM (STAPLE) ×1
STAPLER 90 3.5 STD SLIM (STAPLE) IMPLANT
STAPLER ECHELON POWER CIR 29 (STAPLE) IMPLANT
STAPLER ECHELON POWER CIR 31 (STAPLE) IMPLANT
STAPLER PROXIMATE 75MM BLUE (STAPLE) IMPLANT
STAPLER RELOAD 3.5X45 BLU DVNC (STAPLE)
STAPLER RELOAD 3.5X60 BLU DVNC (STAPLE)
STAPLER RELOAD 4.3X45 GRN DVNC (STAPLE)
STAPLER RELOAD 4.3X60 GRN DVNC (STAPLE) ×2
STOPCOCK 4 WAY LG BORE MALE ST (IV SETS) ×2 IMPLANT
SURGILUBE 2OZ TUBE FLIPTOP (MISCELLANEOUS) IMPLANT
SUT MNCRL AB 4-0 PS2 18 (SUTURE) ×1 IMPLANT
SUT PDS AB 1 CT1 27 (SUTURE) ×2 IMPLANT
SUT PROLENE 0 CT 2 (SUTURE) IMPLANT
SUT PROLENE 2 0 KS (SUTURE) IMPLANT
SUT PROLENE 2 0 SH DA (SUTURE) IMPLANT
SUT SILK 2 0 (SUTURE)
SUT SILK 2 0 SH CR/8 (SUTURE) IMPLANT
SUT SILK 2-0 18XBRD TIE 12 (SUTURE) IMPLANT
SUT SILK 3 0 (SUTURE)
SUT SILK 3 0 SH CR/8 (SUTURE) ×1 IMPLANT
SUT SILK 3-0 18XBRD TIE 12 (SUTURE) IMPLANT
SUT V-LOC BARB 180 2/0GR6 GS22 (SUTURE)
SUT VIC AB 2-0 SH 18 (SUTURE) IMPLANT
SUT VIC AB 3-0 SH 18 (SUTURE) IMPLANT
SUT VIC AB 3-0 SH 27 (SUTURE)
SUT VIC AB 3-0 SH 27XBRD (SUTURE) IMPLANT
SUT VICRYL 0 UR6 27IN ABS (SUTURE) ×1 IMPLANT
SUTURE V-LC BRB 180 2/0GR6GS22 (SUTURE) IMPLANT
SYR 20ML ECCENTRIC (SYRINGE) ×1 IMPLANT
SYS LAPSCP GELPORT 120MM (MISCELLANEOUS)
SYS WOUND ALEXIS 18CM MED (MISCELLANEOUS) ×1
SYSTEM LAPSCP GELPORT 120MM (MISCELLANEOUS) IMPLANT
SYSTEM WOUND ALEXIS 18CM MED (MISCELLANEOUS) ×1 IMPLANT
TOWEL OR NON WOVEN STRL DISP B (DISPOSABLE) ×1 IMPLANT
TRAY FOLEY MTR SLVR 16FR STAT (SET/KITS/TRAYS/PACK) ×1 IMPLANT
TROCAR ADV FIXATION 5X100MM (TROCAR) ×1 IMPLANT
TUBING CONNECTING 10 (TUBING) ×3 IMPLANT
TUBING INSUFFLATION 10FT LAP (TUBING) ×1 IMPLANT
TUBING UROLOGY SET (TUBING) IMPLANT

## 2023-03-01 NOTE — Anesthesia Procedure Notes (Signed)
Procedure Name: Intubation Date/Time: 03/01/2023 12:59 PM  Performed by: Orest Dikes, CRNAPre-anesthesia Checklist: Patient identified, Emergency Drugs available, Suction available and Patient being monitored Patient Re-evaluated:Patient Re-evaluated prior to induction Oxygen Delivery Method: Circle system utilized Preoxygenation: Pre-oxygenation with 100% oxygen Induction Type: IV induction Ventilation: Mask ventilation without difficulty Laryngoscope Size: Mac and 4 Grade View: Grade II Tube type: Oral Tube size: 7.5 mm Number of attempts: 1 Airway Equipment and Method: Stylet Placement Confirmation: ETT inserted through vocal cords under direct vision, positive ETCO2 and breath sounds checked- equal and bilateral Secured at: 21 cm Tube secured with: Tape Dental Injury: Teeth and Oropharynx as per pre-operative assessment

## 2023-03-01 NOTE — H&P (Signed)
03/01/2023   REFERRING PHYSICIAN: Self  Patient Care Team: Unknown (Inactive) as PCP - General Jeoffrey Massed, MD as Consulting Provider (General Surgery)  PROVIDER: Jarrett Soho, MD  [facility] MRN: [id number] DOB: 1961-03-23  SUBJECTIVE  Chief Complaint: Colostomy Takedown   Judith Miller is a 62 y.o. female who is seen today as an office consultation at the request of Dr. Freida Busman & Marletta Lor for evaluation of rectosigmoid stricture abscess and fistula consistent with diverticulitis  History of Present Illness:  62 year old smoking female. Episode of bad diverticulitis last year with worsening constipation cramping abdominal pain. Went to [facility] with evidence of colon perforation and abscesses. She was admitted placed on IV antibiotics. Had drain placement. Had failure to improve so was taken the operating room. She had extremely bad infection abscess and disease and cannot be resected. She was diverted more proximally with a loop transverse colostomy. Had recurrent pelvic infection and has had a chronic drain since last year. Her midline wound healed up. Dr. Freida Busman referred to me to consider definitive resection and potential ostomy takedown.  Patient comes today with her son. She still smokes but "I am trying to cut back". She is back to working. She is trying to establish with [facility] but has not definitely started there. She did get a colonoscopy last month by Dr. Marletta Lor who noted 1 benign polyp in the proximal colon. The sigmoid narrowing seems like a benign diverticular stricture. Some diverticular disease inferior to the stricture applied mid sigmoid stricture.  Patient denies any diabetes. No sleep apnea. She is not had any prior abdominal surgery otherwise. She does have a history of bipolar disorder which I think is affecting her compliance but that seems to be a little better control.  Medical History:  Past Medical History: Diagnosis  Date Anxiety Arthritis  Patient Active Problem List Diagnosis Colonic fistula Diverticulitis of colon with perforation Bipolar affective disorder, currently depressed, mild (CMS/HHS-HCC) Colostomy in place (CMS/HHS-HCC) Tobacco abuse  History reviewed. No pertinent surgical history.  Allergies Allergen Reactions Sulfa (Sulfonamide Antibiotics) Headache  Current Outpatient Medications on File Prior to Visit Medication Sig Dispense Refill acetaminophen (TYLENOL) 500 MG tablet Take by mouth risperiDONE (RISPERDAL) 1 MG tablet Take by mouth sodium chloride 0.9% flush injection Inject by intracatheter route daily 300 mL 2 oxyCODONE (ROXICODONE) 5 MG immediate release tablet Take 1 tablet (5 mg total) by mouth every 6 (six) hours as needed for Pain (to be used for wound vac changes) 15 tablet 0  No current facility-administered medications on file prior to visit.  Family History Problem Relation Age of Onset High blood pressure (Hypertension) Mother High blood pressure (Hypertension) Father   Social History  Tobacco Use Smoking Status Every Day Types: Cigarettes Smokeless Tobacco Current   Social History  Socioeconomic History Marital status: Single Tobacco Use Smoking status: Every Day Types: Cigarettes Smokeless tobacco: Current Substance and Sexual Activity Alcohol use: Never Drug use: Never  ############################################################  Review of Systems: A complete review of systems (ROS) was obtained from the patient. We have reviewed this information and discussed as appropriate with the patient. See HPI as well for other pertinent ROS.  Constitutional: No fevers, chills, sweats. Weight stable Eyes: No vision changes, No discharge HENT: No sore throats, nasal drainage Lymph: No neck swelling, No bruising easily Pulmonary: No cough, productive sputum CV: No orthopnea, PND . No exertional chest/neck/shoulder/arm pain. Patient can  walk 30 minutes without difficulty.  GI: No personal nor family history  of GI/colon cancer, inflammatory bowel disease, irritable bowel syndrome, allergy such as Celiac Sprue, dietary/dairy problems, colitis, ulcers nor gastritis. No recent sick contacts/gastroenteritis. No travel outside the country. No changes in diet.  Renal: No UTIs, No hematuria Genital: No drainage, bleeding, masses Musculoskeletal: No severe joint pain. Good ROM major joints Skin: No sores or lesions Heme/Lymph: No easy bleeding. No swollen lymph nodes Neuro: No active seizures. No facial droop Psych: No hallucinations. No agitation  OBJECTIVE  Vitals: 01/16/23 1423 Weight: 74.4 kg (164 lb) Height: 165.1 cm (5\' 5" )  Body mass index is 27.29 kg/m.  PHYSICAL EXAM:  Constitutional: Not cachectic. Hygeine adequate. Vitals signs as above. Eyes: No glasses. Vision adequate,Pupils reactive, normal extraocular movements. Sclera nonicteric Neuro: CN II-XII intact. No major focal sensory defects. No major motor deficits. Lymph: No head/neck/groin lymphadenopathy Psych: No severe agitation. No severe anxiety. Judgment & insight Adequate, Oriented x4, HENT: Normocephalic, Mucus membranes moist. No thrush. Hearing: adequate Neck: Supple, No tracheal deviation. No obvious thyromegaly Chest: No pain to chest wall compression. Good respiratory excursion. No audible wheezing CV: Pulses intact. regular. No major extremity edema Ext: No obvious deformity or contracture. Edema: Not present. No cyanosis Skin: No major subcutaneous nodules. Warm and dry Musculoskeletal: Severe joint rigidity not present. No obvious clubbing. No digital petechiae. Mobility: no assist device moving easily without restrictions  Abdomen: Left upper quadrant loop colostomy in place with mild prolapse. Gas and stool in bag. Midline incision closed without any obvious hernia. Flat Soft. Nondistended. Nontender. Hernia: Not present. Diastasis recti:  Not present. No hepatomegaly. No splenomegaly.  Genital/Pelvic: Inguinal hernia: Not present. Inguinal lymph nodes: without lymphadenopathy nor hidradenitis.  Rectal: (Deferred)    ###################################################################  Labs, Imaging and Diagnostic Testing:  Located in 'Care Everywhere' section of Epic EMR chart  PRIOR [facility] CLINIC NOTES:  Located in 'Care Everywhere' section of Epic EMR chart  SURGERY NOTES:  Located in 'Care Everywhere' section of Epic EMR chart  PATHOLOGY:  Located in 'Care Everywhere' section of Epic EMR chart  Assessment and Plan: DIAGNOSES:  Diagnoses and all orders for this visit:  Colonic fistula  Diverticulitis of colon with perforation  Bipolar affective disorder, currently depressed, mild (CMS/HHS-HCC)  Colostomy in place (CMS/HHS-HCC)  Tobacco abuse    ASSESSMENT/PLAN  Complex diverticulitis with stricturing abscess and chronic colon fistula status post diverting colostomy and drain placement last year with persistent pelvic drain. .  Will plan robotic rectosigmoid resection. Try and do immediate anastomosis. Hopefully can take diverting loop colostomy down. If there is evidence of very poor tissue abscesses or other concerns may convert to a diverting loop ileostomy which would be less morbid to takedown removed. Would like to have urology to firefly given the numerous recurrent pelvic abscesses to help delineate the ureters and other anatomy for no surprises. Will have preoperative ostomy marking place a diverting loop ileostomy as needed or some other intervention.  The anatomy & physiology of the digestive tract was discussed. The pathophysiology was discussed. Possibility of remaining with an ostomy permanently was discussed. I offered ostomy takedown. Laparoscopic & open techniques were discussed.  Risks such as bleeding, infection, abscess, leak, reoperation, possible re-ostomy, injury to other organs,  need for repair of tissues / organs, need for further treatment, hernia, heart attack, death, and other risks were discussed. I noted a good likelihood this will help address the problem. Goals of post-operative recovery were discussed as well. We will work to minimize complications. Questions were answered. The patient expresses  understanding & wishes to proceed with surgery.  STOP SMOKING! We talked to the patient about the dangers of smoking. We stressed that tobacco use dramatically increases the risk of peri-operative complications such as infection, tissue necrosis leaving to problems with incision/wound and organ healing, hernia, chronic pain, heart attack, stroke, DVT, pulmonary embolism, and death. We noted there are programs in our community to help stop smoking. Information was available.  I again recommended establishing with primary care.    Ardeth Sportsman, MD, FACS, MASCRS Esophageal, Gastrointestinal & Colorectal Surgery Robotic and Minimally Invasive Surgery  [facility] [address], [address] [place], [place] [address] [phone] Fax [phone] Main  CONTACT INFORMATION:  Weekday (9AM-5PM): Call [facility] main office at [phone]  Weeknight (5PM-9AM) or Weekend/Holiday: Check [email or web address] (password " [email or web address]") for General Surgery [facility] coverage  (Please, do not use SecureChat as it is not reliable communication to reach operating surgeons for immediate patient care given surgeries/outpatient duties/clinic/cross-coverage/off post-call which would lead to a delay in care.  Epic staff messaging available for outptient concerns, but may not be answered for 48 hours or more).

## 2023-03-01 NOTE — Brief Op Note (Signed)
03/01/2023  1:20 PM  PATIENT:  Judith Miller  62 y.o. 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  POST-OPERATIVE DIAGNOSIS:  * No post-op diagnosis entered *  PROCEDURE: Cystoscopy with Bilateral Retrogrades / ICG Injection  SURGEON:      * Aveer Bartow, Delbert Phenix., MD - Primary  PHYSICIAN ASSISTANT:   ASSISTANTS: none   ANESTHESIA:   general  EBL:  minimal   BLOOD ADMINISTERED:none  DRAINS:  foley to gravity    LOCAL MEDICATIONS USED:  NONE  SPECIMEN:  No Specimen  DISPOSITION OF SPECIMEN:  N/A  COUNTS:  YES  TOURNIQUET:  * No tourniquets in log *  DICTATION: .Other Dictation: Dictation Number [id number]  PLAN OF CARE:  remain in OR 2  PATIENT DISPOSITION:   remain OR   Delay start of Pharmacological VTE agent (>24hrs) due to surgical blood loss or risk of bleeding: yes

## 2023-03-01 NOTE — Anesthesia Preprocedure Evaluation (Addendum)
Anesthesia Evaluation  Patient identified by MRN, date of birth, ID band Patient awake    Reviewed: Allergy & Precautions, H&P , NPO status , Patient's Chart, lab work & pertinent test results  Airway Mallampati: II  TM Distance: >3 FB Neck ROM: Full    Dental  (+) Missing, Poor Dentition, Loose, Dental Advisory Given,    Pulmonary Patient abstained from smoking., former smoker   Pulmonary exam normal breath sounds clear to auscultation       Cardiovascular negative cardio ROS Normal cardiovascular exam Rhythm:Regular Rate:Normal     Neuro/Psych     Bipolar Disorder   negative neurological ROS     GI/Hepatic negative GI ROS, Neg liver ROS,,,  Endo/Other  negative endocrine ROS    Renal/GU negative Renal ROS  negative genitourinary   Musculoskeletal negative musculoskeletal ROS (+)    Abdominal   Peds negative pediatric ROS (+)  Hematology negative hematology ROS (+)   Anesthesia Other Findings   Reproductive/Obstetrics negative OB ROS                             Anesthesia Physical Anesthesia Plan  ASA: 3  Anesthesia Plan: General   Post-op Pain Management: Tylenol PO (pre-op)*   Induction: Intravenous  PONV Risk Score and Plan: 3 and Ondansetron, Dexamethasone, Treatment may vary due to age or medical condition and Midazolam  Airway Management Planned: Oral ETT  Additional Equipment:   Intra-op Plan:   Post-operative Plan: Extubation in OR  Informed Consent: I have reviewed the patients History and Physical, chart, labs and discussed the procedure including the risks, benefits and alternatives for the proposed anesthesia with the patient or authorized representative who has indicated his/her understanding and acceptance.     Dental advisory given  Plan Discussed with: CRNA and Surgeon  Anesthesia Plan Comments:        Anesthesia Quick Evaluation

## 2023-03-01 NOTE — Op Note (Signed)
Judith Miller, Judith Miller MEDICAL RECORD NO: [id number] ACCOUNT NO: [id number] DATE OF BIRTH: 06-30-1961 FACILITY: [facility] LOCATION: [facility] PHYSICIAN: Sebastian Ache, MD  Operative Report   DATE OF PROCEDURE: 03/01/2023  PREOPERATIVE DIAGNOSIS:  History of colostomy, severe diverticulitis with pelvic abscess, undergoing reconstruction by general surgery team, request for ureteral marking.  POSTOPERATIVE DIAGNOSIS:  History of colostomy, severe diverticulitis with pelvic abscess, undergoing reconstruction by general surgery team, request for ureteral marking.  PROCEDURE PERFORMED:  Cystoscopy with bilateral retrograde pyelograms interpretation.  ESTIMATED BLOOD LOSS:  Nil.  COMPLICATIONS:  None.  SPECIMEN:  None.  FINDINGS: 1.  Unremarkable, bilateral retrograde pyelograms, pelvic drain on the left side appears close to the left ureter. 2.  Unremarkable urinary bladder.  Mild cystocele.  INDICATIONS:  The patient is a 62 year old lady with complex general surgical history, history of diverticulitis with pelvic abscess and colostomy.  She is undergoing attempted further reconstruction by the general surgery team today and they request  preoperative ureteral marking for ureteral identification.  The patient was evaluated.  Imaging reviewed and felt a suitable candidate.  Informed consent was obtained and placed in medical record.  PROCEDURE IN DETAIL:  The patient being identified and verified.  Procedure being cystoscopy, bilateral retrogrades was confirmed.  Procedure timeout was performed.  Intravenous antibiotics were administered.  General endotracheal anesthesia induced.   The patient was placed into a low lithotomy position.  Sterile field was created, prepped and draped the patient's vagina, introitus, and proximal thighs using iodine.  Cystourethroscopy was performed using 21-French rigid cystoscope with offset lens.   Inspection of the urinary bladder revealed mild cystocele, no  diverticula, calcifications, papillary lesions.  The left ureteral orifice was cannulated with 6-French end-hole catheter, and a left retrograde pyelogram was obtained.  Left retrograde pyelogram demonstrated single left ureter, single system left kidney.  No filling defects or narrowing noted.  No extravasation noted.  The in situ pelvic drain appeared to be close to the plane of the left ureter, but again no evidence  of mass effect or extravasation in this location.  Next, right retrograde pyelogram was obtained.  Right retrograde pyelogram demonstrated single right ureter, single system right kidney.  No filling defects or narrowing noted.  The retrograde was a 50/50 slurry, ICG dye and contrast.  Next, a 16-French Foley catheter was placed per urethra to straight drain,  10 mL sterile water in balloon.  Procedure was terminated.  The patient tolerated procedure well, no immediate periprocedural complications.  The patient was taken to remain in OR 2 for the general surgery portion of her procedure today.   PUS D: 03/01/2023 1:24:19 pm T: 03/01/2023 2:11:00 pm  JOB: [id number]/ [id number]

## 2023-03-01 NOTE — Transfer of Care (Signed)
Immediate Anesthesia Transfer of Care Note  Patient: Judith Miller  Procedure(s) Performed: ROBOTIC RESECTION OF RECTOSIGMOID COLON, TAKEDOWN OF LOOP COLOSTOMY, LYSIS OF ADHESIONS, APPENDECTOMY, SMALL BOWEL RESECTION, BILATERAL TAP BLOCK, TISSUE PERFUSION ASSESSMENT VIA FIREFLY INJECTION FLEXIBLE SIGMOIDOSCOPY CYSTOSCOPY with FIREFLY INJECTION  Patient Location: PACU  Anesthesia Type:General  Level of Consciousness: drowsy and patient cooperative  Airway & Oxygen Therapy: Patient Spontanous Breathing and Patient connected to face mask oxygen  Post-op Assessment: Report given to RN and Post -op Vital signs reviewed and stable  Post vital signs: Reviewed and stable  Last Vitals:  Vitals Value Taken Time  BP 178/83 03/01/23 1719  Temp    Pulse 87 03/01/23 1720  Resp 15 03/01/23 1720  SpO2 100 % 03/01/23 1720  Vitals shown include unvalidated device data.  Last Pain:  Vitals:   03/01/23 1143  TempSrc: Oral  PainSc: 0-No pain         Complications: No notable events documented.

## 2023-03-01 NOTE — H&P (Signed)
Judith Miller is an 62 y.o. female.    Chief Complaint: Pre-OP Cystoscopy and bilateral retrogrades / ICG Injection  HPI:   1- Diverticulitis / Recurrent Pelvic Abscesses / Request for Peri-OP Ureteral Marking - h/o colosotmy / bowel resection for severe diverticulitis and recurrent abscesses unergoing furhter surgery by gen surg team today reqeusts ureteral marking peri-op for indentification and protection. 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  Today "Judith Miller" is seen for cysto, ICG retrogrades.   Past Medical History:  Diagnosis Date   Arthritis    Bipolar disorder (HCC)    Colostomy status (HCC)    Deviated septum    Diverticulitis of colon with perforation    Fistula of intestine    Headache    sinus    Past Surgical History:  Procedure Laterality Date   BOWEL RESECTION N/A 12/15/2021   Procedure: SMALL BOWEL RESECTION;  Surgeon: Fritzi Mandes, MD;  Location: [facility] OR;  Service: General;  Laterality: N/A;   CESAREAN SECTION  1989   COLONOSCOPY WITH PROPOFOL N/A 09/12/2022   Procedure: COLONOSCOPY WITH PROPOFOL;  Surgeon: Lanelle Bal, DO;  Location: [facility];  Service: Endoscopy;  Laterality: N/A;  2:00 pm, pt unsure if she will have a ride   COLONOSCOPY WITH PROPOFOL N/A 12/27/2022   Procedure: COLONOSCOPY WITH PROPOFOL;  Surgeon: Lanelle Bal, DO;  Location: [facility];  Service: Endoscopy;  Laterality: N/A;  11:15am, asa 2, via colostomy/rectum   IR CATHETER TUBE CHANGE  12/27/2021   IR CATHETER TUBE CHANGE  03/24/2022   IR RADIOLOGIST EVAL & MGMT  01/26/2022   IR RADIOLOGIST EVAL & MGMT  04/14/2022   IR RADIOLOGIST EVAL & MGMT  06/08/2022   KNEE ARTHROSCOPY Left    LAPAROTOMY N/A 12/15/2021   Procedure: EXPLORATORY LAPAROTOMY;  Surgeon: Fritzi Mandes, MD;  Location: [facility] OR;  Service: General;  Laterality: N/A;   POLYPECTOMY  12/27/2022   Procedure: POLYPECTOMY;  Surgeon: Lanelle Bal, DO;  Location: [facility];  Service:  Endoscopy;;   TRANSVERSE LOOP COLOSTOMY N/A 12/15/2021   Procedure: TRANSVERSE LOOP COLOSTOMY;  Surgeon: Fritzi Mandes, MD;  Location: [facility] OR;  Service: General;  Laterality: N/A;   UPPER GI ENDOSCOPY      Family History  Problem Relation Age of Onset   Colon cancer Neg Hx    Social History:  reports that she quit smoking about 2 months ago. Her smoking use included cigarettes. She has a 20.00 pack-year smoking history. She has never used smokeless tobacco. She reports that she does not drink alcohol and does not use drugs.  Allergies:  Allergies  Allergen Reactions   Other Other (See Comments)    Diverticulitis- NO FOODS WITH SEEDS, NUTS, POPCORN!!   Sulfa Antibiotics Other (See Comments)    Headaches     No medications prior to admission.    No results found for this or any previous visit (from the past 48 hour(s)). No results found.  Review of Systems  Constitutional:  Negative for chills and fever.  All other systems reviewed and are negative.   There were no vitals taken for this visit. Physical Exam HENT:     Head: Normocephalic.     Nose: Nose normal.  Eyes:     Pupils: Pupils are equal, round, and reactive to light.  Cardiovascular:     Rate and Rhythm: Normal rate.  Abdominal:     General: Abdomen is flat.     Comments:  Moderate truncal obesity, patent colostomy Left side.   Genitourinary:    Comments: No CVAT at present Musculoskeletal:        General: Normal range of motion.     Cervical back: Normal range of motion.  Skin:    General: Skin is warm.  Neurological:     General: No focal deficit present.     Mental Status: She is alert.  Psychiatric:        Mood and Affect: Mood normal.      Assessment/Plan  Proceed as planned with cysto, bilateral retrogrades / ICG for ureteral markign peri-op. Risks, benefits, peri-op course discussed.   Loletta Parish., MD 03/01/2023, 8:17 AM

## 2023-03-01 NOTE — Op Note (Signed)
03/01/2023  5:09 PM  PATIENT:  Judith Miller  62 y.o. 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  This assistant was needed for exposure, dissection, suction, tissue approximation, retraction, perception, etc  ANESTHESIA:  General endotracheal intubation anesthesia (GETA) and Regional TRANSVERSUS ABDOMINIS PLANE (TAP) nerve block -BILATERAL for perioperative & postoperative pain control at the level of the transverse abdominis & preperitoneal spaces along the flank at the anterior axillary line, from subcostal ridge to iliac crest under laparoscopic guidance provided with liposomal bupivacaine (Experel) 20mL mixed with 50 mL of bupivicaine 0.25% with epinephrine  Estimated Blood Loss (EBL):   Total I/O In: 1900 [I.V.:1800; IV Piggyback:100] Out: 600 [Urine:400; Blood:200].   (See anesthesia record)  Delay start of Pharmacological VTE agent (>24hrs) due to concerns of significant anemia, surgical  blood loss, or risk of bleeding?:  no  DRAINS: (None) and 19 Fr Blake drain the tip resting in the pelvis  SPECIMEN:  Small intestine , Appendix, Colostomy, Rectosigmoid (open end proximal), and Distal anastomotic ring (FINAL DISTAL MARGIN)  DISPOSITION OF SPECIMEN:  Pathology  COUNTS:  Sponge, needle, & instrument counts CORRECT  PLAN OF CARE: Admit to inpatient   PATIENT DISPOSITION:  PACU - hemodynamically stable.  INDICATION:    62 year old woman who had severe abdominal pain and found to have multifocal abscesses due to diverticulitis last summer in 2023.  Admitted with IV antibiotics and percutaneous drainage.  Failed progress.  Attempt made at [facility] resection but encountered extremely dense adhesions and inflammation.  Small bowel resection needed.  Diverting transverse colostomy done since the descending colon was too inflamed to bring up.  Developed postoperative pelvic abscesses persistent despite intermittent drains with chronic fistula.  Colonoscopy showing no evidence of any other lesions or concerns and chronic fibrotic stricture, most likely consistent with diverticulitis and no malignancy.  We got her to quit smoking.  Medically improved.  Malnutrition resolved.  She wished colostomy takedown.  I recommended segmental resection:  The anatomy & physiology of the digestive tract was discussed.  The pathophysiology was discussed.  Natural history risks without surgery was discussed.   I worked to give an overview of the disease and the frequent need to have multispecialty involvement.  I feel the risks of no intervention will lead to serious problems that outweigh the operative risks; therefore, I recommended a partial colectomy to remove the pathology.  Laparoscopic & open techniques were discussed.   Risks such as bleeding, infection, abscess, leak, reoperation, possible ostomy, hernia, heart attack, death, and other risks were discussed.  I noted a good likelihood this will help  address the problem.   Goals of post-operative recovery were  discussed as well.  We will work to minimize complications.  Educational materials on the pathology had been given in the office.  Questions were answered.    The patient expressed understanding & wished to proceed with surgery.  OR FINDINGS:   Patient had very dense adhesions omentum to the anterior abdominal wall.  Very dense adhesions of small bowel to the retroperitoneum especially in the pelvis circumferentially.  Couple areas causing partial obstruction with stricturing especially at the distal ileum and mid ileum.  Ultimately had to resect section of small bowel with extremely dense stricture in enterotomy and thin serosal areas due to extremely dense adhesions.  Rectosigmoid resection done dense fibrotic inflammation with chronic white pigtail percutaneous drain resting in the left anterolateral pelvis near the proximal rectum and stricturing.  Phlegmon and inflammation but no active abscess.  No obvious metastatic disease on visceral parietal peritoneum or liver.  It is a 29mm EEA anastomosis ( distal descending colon  connected to mid rectum.)  It rests 9 cm from the anal verge by rigid proctoscopy.  CASE DATA:  Type of patient?: Elective [facility] Private Case  Status of Case? Elective Scheduled  Infection Present At Time Of Surgery (PATOS)?  PHLEGMON  DESCRIPTION:   Informed consent was confirmed.  The patient underwent general anaesthesia without difficulty.  The patient was positioned appropriately.  VTE prevention in place.  Patient underwent cystoscopy with ICG firefly injection into both ureters and bladder by Dr. Dwana Curd with [facility] per my request.  Please see his separate operative note.  The patient was clipped, prepped, & draped in a sterile fashion.  Surgical timeout confirmed our plan.  The patient was positioned in reverse Trendelenburg.  Abdominal entry was gained using Varess technique at the left  subcostal ridge on the anterior abdominal wall.  No elevated EtCO2 noted.  Port placed.  Camera inspection revealed no injury.  Extra ports were carefully placed under direct laparoscopic visualization.  Upon entering the abdomen (organ space),we encountered very dense adhesions of small bowel and omentum to the anterior abdominal wall.  Later in the case, countered an obvious phlegmon in the lower pelvis at the rectosigmoid junction including fair amount of sigmoid and rectum consistent with chronic diverticulitis with phlegmon.  Was able to gradually free of enough adhesions to get robotic laparoscopic ports in the abdomen.  The patient was carefully positioned.  The Intuitive daVinci robot was docked with camera & instruments carefully placed.  Focus on lysing adhesions to free greater omentum off the anterior abdominal wall around the colostomy.  Freed small bowel adhesions to anterior abdominal wall and left retroperitoneum.  Also transverse colon mesentery.  Patient had a moderate-sized transverse colostomy parastomal hernia with a moderate volume of greater omentum within it.  No small bowel within it.  Focused down in the pelvis.  Patient had extremely dense adhesions of small bowel the entire pelvis.  Worked by loop by loop to free up interloop adhesions and eventually had to free up small bowel using sharp scissors as well as appendix off the pelvis.  Some very dense bands.  Ending up having to cut a little bit into the small bowel wall to slightly get it off the right ureter.  Also had take time to get it off the adnexa and uterus.  Eventually freed all interloop adhesions.  Saw couple areas of concern but no active leaking at this time.  Focused on rectosigmoid resection.  Was able to elevate the rectosigmoid mesentery to  identify the IMV/IMA pedicle & put the main pedicle on tension.  I scored the base of peritoneum of the medial side of the mesentery of the elevated left colon from the ligament  of Treitz to the mid rectum.   I elevated the sigmoid mesentery and entered into the retro-mesenteric plane. We were able to identify the left ureter and gonadal vessels. We kept those posterior within the retroperitoneum and elevated the left colon mesentery off that. I did isolate the inferior mesenteric artery (IMA) pedicle.  I continued distally and got into the avascular plane posterior to the mesorectum, sparing the nervi ergentes.. This allowed me to help mobilize the rectum as well by freeing the mesorectum off the sacrum.  With this second free of the left small bowel loops of the rectosigmoid mesentery and get them more formally reduced.  I stayed away from the right and left ureters.  I kept the lateral vascular pedicles to the rectum intact.   I skeletonized the lymph nodes off the inferior mesenteric artery pedicle.  I went down to its takeoff from the aorta.  I isolated the inferior mesenteric vein off of the ligament of Treitz just cephalad to that as well.  After confirming the left ureter was out of the way, I went ahead and ligated the inferior mesenteric artery pedicle just near its takeoff from the aorta.   We ensured hemostasis.  I continued medial to lateral dissection to free the left colon mesentery off the retroperitoneum going up towards the splenic flexure to allow good mobility and protect the colon mesentery.  I mobilized the left colon in a lateral to medial fashion off the retroperitoneum and sidewall attachments along the line of Toldt up towards the splenic flexure to ensure good mobilization of the remaining left colon to reach into the pelvis.   We then turned to mesorectal dissection.  Freed the mesorectum off the presacral plane until I was distal to the concerning region.  Freed off peritoneum on the lateral sidewalls as well and transected the mesentery of the lateral pedicles to get distal to the area of concern.  Came around anteriorly such that I had good circumferential  mesorectal excision and a good margin distal to the area of concern.  This was the stellate region of extremely dense adhesions.  Eventually in the if left anterior lateral mid pelvis came into the chronic cavity with the percutaneous drain in place.  Confirmed that the drain had not eroded into the rectum nor uterus or vagina.  Drain was removed.  Did circumferential dissection until I came distal to the chronic fibrotic proximal rectal stricture and narrowing about at the level of the peritoneal reflection.  It was rather thick and inflamed anteriorly so when an extra centimeter distally.  I then went down and used digital examination and EEA sizers and flexible sigmoidoscopy to confirm that I had unkinked and untwisted the area and know that we had dissected distal to the chronic stricturing where the chronic fistula and drain had been.  No leaking distal to the soft mid rectum.  Rectum with viable with no concerning lesions.  Some narrowing and twisting at the chronic stricture but is able to get proximal to it as well.  No mass or tumor.  I chose a region at the descending/sigmoid junction that was soft and easily reached down to the rectal stump.  I skeletonized the mesorectum at the mid rectum.  We then chose a region for the proximal margin that  would reach well for our planned anastomosis (distal descending colon).  Transected the colon mesentery radially to preserve good collateral and marginal artery blood supply.  To access vascular perfusion of tissues, we asked anesthesia use intravenous  indocyanine green (ICG) with IV flush.  I switched to the NIR fluorescence (Firefly mode) imaging window on the daVinci robot platform.  We were able to see good light green visualization of blood vessels with good vascular perfusion of tissues, confirming good tissue perfusion of tissues (distal descending colon and mid rectum) planned for anastomosis.  We also inspected  the small bowel and saw no evidence of any  segment of ischemia nor necrosis. Then transected at the distal margin with a robotic stapler -millimeter green load. 80% across the first firing second firing completed  Focused on distal transverse colostomy takedown.  Came through the skin and dermal tissues circumferentially and came into the parastomal hernia sac.  Freed attachments and all the way into the peritoneal cavity.  Placed a wound protector.  However eviscerated the transverse colon.  I did a side-to-side staple anastomosis using a GIA 75 stapler of the mid/distal transverse colon.  Transected the mesentery using clamps and silk ties.  Transected the colostomy and close the staple defect using a TX 90 stapler to good result.  Used interrupted silk sutures to talk in the corners and center of the TX90 staple line.  Mesenteric defect closed.  Hemostasis is good.  We lined up return into the abdomen.  Repaired greater omentum defect with interrupted silk suture as well.  This point Dr. Cliffton Asters had to leave the operating room briefly and Dr. Donell Beers was able to come in and help with the small bowel and low anterior resection.  We then brought the small bowel up.  Did inspection and confirmed there was enterotomy in the mid distal ileum.  Some serosal areas as well.  There was a fibrotic nodule on the serosa of the small bowel.  Most likely was calcified stool or fat necrosis and not malignant but felt that need to be resected.  Came down to the more distal ileum and there was extremely dense corkscrew twisting and adhesion that was very inflammatory and clearly a transition point.  Felt given the numerous areas of concern that should do a small bowel resection.  Did resection in a similar [place] fashion with a side-to-side 75 stapler.  Transected the ileal mesentery using bipolar LigaSure system.  Transected and closed the ilio-ileal side-to-side anastomosis using a TX 90 staple line.  Close the mesenteric defect transversely to cover over the TX 90  staple line as a mesenteric pexy.  The appendix was also rather inflamed and of poor quality and so decided to staple out off with the stapler as well.  Did a pursestring silk suture to help imbricate the cecal staple line to be resolved.  Inspected and hemostasis.  We then focused on the rectosigmoid colon. We were able to eviscerate the rectosigmoid and descending colon out the wound.   I clamped the colon proximal to this area using a reusable pursestringer device.  Passed a 2-0 Keith needle. I transected at the descending/sigmoid junction with a scalpel. I got healthy bleeding mucosa.  We sent the rectosigmoid colon specimen off to go to pathology.  We sized the colon orifice.  I chose a 29mm EEA anvil stapler system.  I reinforced the prolene pursestring with interrupted silk "belt loop" sutures.  I placed the anvil to the open end of  the proximal remaining colon and closed around it using the pursestring.    We did copious irrigation with crystalloid solution.  Hemostasis was good.  The distal end of the remaining colon easily reached down to the rectal stump, therefore, splenic flexure mobilization was not needed.      Dr Cliffton Asters scrubbed down and did gentle anal dilation and advanced the EEA stapler up the rectal stump. The spike was brought out at the provimal end of the rectal stump under direct visualization.  I  attached the anvil of the proximal colon the spike of the stapler. Anvil was tightened down and held clamped for 60 seconds.  Orientation was confirmed such that there is no twisting of the colon nor small bowel underneath the mesenteric defect. No concerning tension.  The EEA stapler was fired and held clamped for 30 seconds. The stapler was released & removed. Blue stitch is in the proximal ring.  Care was taken to ensure no other structures were incorporated within this either.  We noted 2 excellent anastomotic rings.   The colon proximal to the anastomosis was then gently occluded. The  pelvis was filled with sterile irrigation.  Dr Cliffton Asters repeated flexible sigmoidoscopy.  Anastomosis was at 8-10 cm from the anal verge consistent with the mid rectum.  There was a negative air leak test. There was no tension of mesentery or bowel at the anastomosis.   Tissues looked viable.  Ureters & bowel uninjured.  The anastomosis looked healthy. Greater omentum positioned down into the pelvis to help protect the anastomosis but would not fully reach.  Blake drain placed given the chronic inflammation and chronic fistula/abscess cavity.  Because the tissues had good viability, no major purulence, she was no longer smoking, no major blood loss, no intraoperative events, and anastomosis at 8-10 cm with no prior radiation; I felt it was safe to avoid diverting loop ileostomy.  Dr. Cliffton Asters and Dr. Donell Beers agreed.  Endoluminal gas was evacuated.  Ports & wound protector removed.  We followed colon SSI prevention protocol.  We aspirated the sterile irrigation.  Hemostasis was good.  Sterile unused instruments were used from this point.  I closed the skin at the port sites using Monocryl stitch and sterile dressing.  We assured hemostasis and the former ostomy wound.  Wound irrigated.  Excised hernia sac and redundant subcutaneous tissues.  Inspected the fascia.  The fascia dilated more vertically around the parastomal hernia.  We reapproximated using #1 PDS in a vertical fashion starting from the superior and inferior corners of the middle to good result.  Skin port sites closed with 4 Monocryl.  Drain had been placed as noted above.   I excised redundant tissue to have a transverse biconcave wound.  I closed this with interrupted suture Vicryl at Scarpa's and deep dermal 4-0 monocryl sutures.  Placed umbilical tape wicks x2 in the wound to allow deeper drainage..  Sterile dressing placed.   Patient is being extubated go to recovery room. I had discussed postop care with the patient in detail the office & in the  holding area. Instructions are written. I discussed operative findings, updated the patient's status, discussed probable steps to recovery, and gave postoperative recommendations to the patient's son, Neoma Laming .  Recommendations were made.  Questions were answered.  He expressed understanding & appreciation.  Ardeth Sportsman, M.D., F.A.C.S. Gastrointestinal and Minimally Invasive Surgery [facility] [address] [place], [place] [address] [phone] Main / Paging

## 2023-03-01 NOTE — Discharge Instructions (Signed)
SURGERY: POST OP INSTRUCTIONS (Surgery for small bowel obstruction, colon resection, etc)   ######################################################################  EAT Gradually transition to a high fiber diet with a fiber supplement over the next few days after discharge  WALK Walk an hour a day.  Control your pain to do that.    CONTROL PAIN Control pain so that you can walk, sleep, tolerate sneezing/coughing, go up/down stairs.  HAVE A BOWEL MOVEMENT DAILY Keep your bowels regular to avoid problems.  OK to try a laxative to override constipation.  OK to use an antidairrheal to slow down diarrhea.  Call if not better after 2 tries  CALL IF YOU HAVE PROBLEMS/CONCERNS Call if you are still struggling despite following these instructions. Call if you have concerns not answered by these instructions  ######################################################################   DIET Follow a light diet the first few days at home.  Start with a bland diet such as soups, liquids, starchy foods, low fat foods, etc.  If you feel full, bloated, or constipated, stay on a ful liquid or pureed/blenderized diet for a few days until you feel better and no longer constipated. Be sure to drink plenty of fluids every day to avoid getting dehydrated (feeling dizzy, not urinating, etc.). Gradually add a fiber supplement to your diet over the next week.  Gradually get back to a regular solid diet.  Avoid fast food or heavy meals the first week as you are more likely to get nauseated. It is expected for your digestive tract to need a few months to get back to normal.  It is common for your bowel movements and stools to be irregular.  You will have occasional bloating and cramping that should eventually fade away.  Until you are eating solid food normally, off all pain medications, and back to regular activities; your bowels will not be normal. Focus on eating a low-fat, high fiber diet the rest of your life  (See Getting to Good Bowel Health, below).  CARE of your INCISION or WOUND  It is good for closed incisions and even open wounds to be washed every day.  Shower every day.  Short baths are fine.  Wash the incisions and wounds clean with soap & water.    You may leave closed incisions open to air if it is dry.   You may cover the incision with clean gauze & replace it after your daily shower for comfort.  TEGADERM:  You have clear gauze band-aid dressings over your closed incision(s).  Remove the dressings 3 days after surgery.    If you have an open wound with a wound vac, see wound vac care instructions.    ACTIVITIES as tolerated Start light daily activities --- self-care, walking, climbing stairs-- beginning the day after surgery.  Gradually increase activities as tolerated.  Control your pain to be active.  Stop when you are tired.  Ideally, walk several times a day, eventually an hour a day.   Most people are back to most day-to-day activities in a few weeks.  It takes 4-8 weeks to get back to unrestricted, intense activity. If you can walk 30 minutes without difficulty, it is safe to try more intense activity such as jogging, treadmill, bicycling, low-impact aerobics, swimming, etc. Save the most intensive and strenuous activity for last (Usually 4-8 weeks after surgery) such as sit-ups, heavy lifting, contact sports, etc.  Refrain from any intense heavy lifting or straining until you are off narcotics for pain control.  You will have off days, but   things should improve week-by-week. DO NOT PUSH THROUGH PAIN.  Let pain be your guide: If it hurts to do something, don't do it.  Pain is your body warning you to avoid that activity for another week until the pain goes down. You may drive when you are no longer taking narcotic prescription pain medication, you can comfortably wear a seatbelt, and you can safely make sudden turns/stops to protect yourself without hesitating due to pain. You may  have sexual intercourse when it is comfortable. If it hurts to do something, stop.   MEDICATIONS Take your usually prescribed home medications unless otherwise directed.   Blood thinners:  You can restart any strong blood thinners after the second postoperative day.  It is OK to continue aspirin before & after surgery..    Some blood in BMs the first 1-2 weeks is common but should taper down & be small volume.  If you are passing many large clots, call your surgeon    PAIN CONTROL Pain after surgery or related to activity is often due to strain/injury to muscle, tendon, nerves and/or incisions.  This pain is usually short-term and will improve in a few months.  To help speed the process of healing and to get back to regular activity more quickly, DO THE FOLLOWING THINGS TOGETHER: Increase activity gradually.  DO NOT PUSH THROUGH PAIN Use Ice and/or Heat Try Gentle Massage and/or Stretching Take over the counter pain medication Take Narcotic prescription pain medication for more severe pain  Good pain control = faster recovery.  It is better to take more medicine to be more active than to stay in bed all day to avoid medications.  Increase activity gradually Avoid heavy lifting at first, then increase to lifting as tolerated over the next 6 weeks. Do not "push through" the pain.  Listen to your body and avoid positions and maneuvers than reproduce the pain.  Wait a few days before trying something more intense Walking an hour a day is encouraged to help your body recover faster and more safely.  Start slowly and stop when getting sore.  If you can walk 30 minutes without stopping or pain, you can try more intense activity (running, jogging, aerobics, cycling, swimming, treadmill, sex, sports, weightlifting, etc.) Remember: If it hurts to do it, then don't do it! Use Ice and/or Heat You will have swelling and bruising around the incisions.  This will take several weeks to resolve. Ice packs  or heating pads (6-8 times a day, 30-60 minutes at a time) will help sooth soreness & bruising. Some people prefer to use ice alone, heat alone, or alternate between ice & heat.  Experiment and see what works best for you.  Consider trying ice for the first few days to help decrease swelling and bruising; then, switch to heat to help relax sore spots and speed recovery. Shower every day.  Short baths are fine.  It feels good!  Keep the incisions and wounds clean with soap & water.   Try Gentle Massage and/or Stretching Massage at the area of pain many times a day Stop if you feel pain - do not overdo it Take over the counter pain medication This helps the muscle and nerve tissues become less irritable and calm down faster Choose ONE of the following over-the-counter anti-inflammatory medications: Acetaminophen 500mg tabs (Tylenol) 1-2 pills with every meal and just before bedtime (avoid if you have liver problems or if you have acetaminophen in you narcotic prescription) Naproxen 220mg tabs (  ex. Aleve, Naprosyn) 1-2 pills twice a day (avoid if you have kidney, stomach, IBD, or bleeding problems) Ibuprofen 200mg tabs (ex. Advil, Motrin) 3-4 pills with every meal and just before bedtime (avoid if you have kidney, stomach, IBD, or bleeding problems) Take with food/snack several times a day as directed for at least 2 weeks to help keep pain / soreness down & more manageable. Take Narcotic prescription pain medication for more severe pain A prescription for strong pain control is often given to you upon discharge (for example: oxycodone/Percocet, hydrocodone/Norco/Vicodin, or tramadol/Ultram) Take your pain medication as prescribed. Be mindful that most narcotic prescriptions contain Tylenol (acetaminophen) as well - avoid taking too much Tylenol. If you are having problems/concerns with the prescription medicine (does not control pain, nausea, vomiting, rash, itching, etc.), please call us [phone]  [phone] to see if we need to switch you to a different pain medicine that will work better for you and/or control your side effects better. If you need a refill on your pain medication, you must call the office before 4 pm and on weekdays only.  By federal law, prescriptions for narcotics cannot be called into a pharmacy.  They must be filled out on paper & picked up from our office by the patient or authorized caretaker.  Prescriptions cannot be filled after 4 pm nor on weekends.    WHEN TO CALL US [phone] Severe uncontrolled or worsening pain  Fever over 101 F (38.5 C) Concerns with the incision: Worsening pain, redness, rash/hives, swelling, bleeding, or drainage Reactions / problems with new medications (itching, rash, hives, nausea, etc.) Nausea and/or vomiting Difficulty urinating Difficulty breathing Worsening fatigue, dizziness, lightheadedness, blurred vision Other concerns If you are not getting better after two weeks or are noticing you are getting worse, contact our office [phone] for further advice.  We may need to adjust your medications, re-evaluate you in the office, send you to the emergency room, or see what other things we can do to help. The clinic staff is available to answer your questions during regular business hours (8:30am-5pm).  Please don't hesitate to call and ask to speak to one of our nurses for clinical concerns.    A surgeon from [facility] is always on call at the hospitals 24 hours/day If you have a medical emergency, go to the nearest emergency room or call 911.  FOLLOW UP in our office One the day of your discharge from the hospital (or the next business weekday), please call [facility] to set up or confirm an appointment to see your surgeon in the office for a follow-up appointment.  Usually it is 2-3 weeks after your surgery.   If you have skin staples at your incision(s), let the office know so we can set up a time  in the office for the nurse to remove them (usually around 10 days after surgery). Make sure that you call for appointments the day of discharge (or the next business weekday) from the hospital to ensure a convenient appointment time. IF YOU HAVE DISABILITY OR FAMILY LEAVE FORMS, BRING THEM TO THE OFFICE FOR PROCESSING.  DO NOT GIVE THEM TO YOUR DOCTOR.  [facility] [address], [address], [place], [place]  [address] ? [phone] - Main [phone] - Toll Free,  [phone] - Fax [email or web address]    GETTING TO GOOD BOWEL HEALTH. It is expected for your digestive tract to need a few months to get back to   normal.  It is common for your bowel movements and stools to be irregular.  You will have occasional bloating and cramping that should eventually fade away.  Until you are eating solid food normally, off all pain medications, and back to regular activities; your bowels will not be normal.   Avoiding constipation The goal: ONE SOFT BOWEL MOVEMENT A DAY!    Drink plenty of fluids.  Choose water first. TAKE A FIBER SUPPLEMENT EVERY DAY THE REST OF YOUR LIFE During your first week back home, gradually add back a fiber supplement every day Experiment which form you can tolerate.   There are many forms such as powders, tablets, wafers, gummies, etc Psyllium bran (Metamucil), methylcellulose (Citrucel), Miralax or Glycolax, Benefiber, Flax Seed.  Adjust the dose week-by-week (1/2 dose/day to 6 doses a day) until you are moving your bowels 1-2 times a day.  Cut back the dose or try a different fiber product if it is giving you problems such as diarrhea or bloating. Sometimes a laxative is needed to help jump-start bowels if constipated until the fiber supplement can help regulate your bowels.  If you are tolerating eating & you are farting, it is okay to try a gentle laxative such as double dose MiraLax, prune juice, or Milk of Magnesia.  Avoid using  laxatives too often. Stool softeners can sometimes help counteract the constipating effects of narcotic pain medicines.  It can also cause diarrhea, so avoid using for too long. If you are still constipated despite taking fiber daily, eating solids, and a few doses of laxatives, call our office. Controlling diarrhea Try drinking liquids and eating bland foods for a few days to avoid stressing your intestines further. Avoid dairy products (especially milk & ice cream) for a short time.  The intestines often can lose the ability to digest lactose when stressed. Avoid foods that cause gassiness or bloating.  Typical foods include beans and other legumes, cabbage, broccoli, and dairy foods.  Avoid greasy, spicy, fast foods.  Every person has some sensitivity to other foods, so listen to your body and avoid those foods that trigger problems for you. Probiotics (such as active yogurt, Align, etc) may help repopulate the intestines and colon with normal bacteria and calm down a sensitive digestive tract Adding a fiber supplement gradually can help thicken stools by absorbing excess fluid and retrain the intestines to act more normally.  Slowly increase the dose over a few weeks.  Too much fiber too soon can backfire and cause cramping & bloating. It is okay to try and slow down diarrhea with a few doses of antidiarrheal medicines.   Bismuth subsalicylate (ex. Kayopectate, Pepto Bismol) for a few doses can help control diarrhea.  Avoid if pregnant.   Loperamide (Imodium) can slow down diarrhea.  Start with one tablet (2mg) first.  Avoid if you are having fevers or severe pain.  ILEOSTOMY PATIENTS WILL HAVE CHRONIC DIARRHEA since their colon is not in use.    Drink plenty of liquids.  You will need to drink even more glasses of water/liquid a day to avoid getting dehydrated. Record output from your ileostomy.  Expect to empty the bag every 3-4 hours at first.  Most people with a permanent ileostomy empty their  bag 4-6 times at the least.   Use antidiarrheal medicine (especially Imodium) several times a day to avoid getting dehydrated.  Start with a dose at bedtime & breakfast.  Adjust up or down as needed.  Increase antidiarrheal medications as   directed to avoid emptying the bag more than 8 times a day (every 3 hours). Work with your wound ostomy nurse to learn care for your ostomy.  See ostomy care instructions. TROUBLESHOOTING IRREGULAR BOWELS 1) Start with a soft & bland diet. No spicy, greasy, or fried foods.  2) Avoid gluten/wheat or dairy products from diet to see if symptoms improve. 3) Miralax 17gm or flax seed mixed in 8oz. water or juice-daily. May use 2-4 times a day as needed. 4) Gas-X, Phazyme, etc. as needed for gas & bloating.  5) Prilosec (omeprazole) over-the-counter as needed 6)  Consider probiotics (Align, Activa, etc) to help calm the bowels down  Call your doctor if you are getting worse or not getting better.  Sometimes further testing (cultures, endoscopy, X-ray studies, CT scans, bloodwork, etc.) may be needed to help diagnose and treat the cause of the diarrhea. [facility] [address], [address], [place], [place]  [address] [phone] - Main.    [phone]  - Toll Free.   [phone] - Fax [email or web address]  

## 2023-03-02 ENCOUNTER — Encounter (HOSPITAL_COMMUNITY): Payer: Self-pay | Admitting: Surgery

## 2023-03-02 LAB — CBC
HCT: 36.6 % (ref 36.0–46.0)
Hemoglobin: 11.9 g/dL — ABNORMAL LOW (ref 12.0–15.0)
MCH: 30.3 pg (ref 26.0–34.0)
MCHC: 32.5 g/dL (ref 30.0–36.0)
MCV: 93.1 fL (ref 80.0–100.0)
Platelets: 252 10*3/uL (ref 150–400)
RBC: 3.93 MIL/uL (ref 3.87–5.11)
RDW: 13.1 % (ref 11.5–15.5)
WBC: 13.5 10*3/uL — ABNORMAL HIGH (ref 4.0–10.5)
nRBC: 0 % (ref 0.0–0.2)

## 2023-03-02 LAB — BASIC METABOLIC PANEL
Anion gap: 5 (ref 5–15)
BUN: 6 mg/dL — ABNORMAL LOW (ref 8–23)
CO2: 29 mmol/L (ref 22–32)
Calcium: 9 mg/dL (ref 8.9–10.3)
Chloride: 103 mmol/L (ref 98–111)
Creatinine, Ser: 0.71 mg/dL (ref 0.44–1.00)
GFR, Estimated: >60 mL/min (ref >60)
Glucose, Bld: 113 mg/dL — ABNORMAL HIGH (ref 70–99)
Potassium: 4.1 mmol/L (ref 3.5–5.1)
Sodium: 137 mmol/L (ref 135–145)

## 2023-03-02 LAB — MAGNESIUM: Magnesium: 1.8 mg/dL (ref 1.7–2.4)

## 2023-03-02 MED ORDER — GABAPENTIN 100 MG PO CAPS
200.0000 mg | ORAL_CAPSULE | Freq: Three times a day (TID) | ORAL | Status: DC
  Administered 2023-03-02 (×3): 200 mg via ORAL
  Filled 2023-03-02 (×3): qty 2

## 2023-03-02 NOTE — Progress Notes (Signed)
   03/02/23 1538  TOC Brief Assessment  Insurance and Status Reviewed  Patient has primary care physician Yes  Home environment has been reviewed home alone  Prior level of function: independent  Prior/Current Home Services No current home services  Social Determinants of Health Reivew SDOH reviewed no interventions necessary  Readmission risk has been reviewed Yes  Transition of care needs no transition of care needs at this time

## 2023-03-02 NOTE — Progress Notes (Signed)
03/02/2023  Judith Miller [id number] 1961-01-13  CARE TEAM: PCP: [facility], [facility]  Outpatient Care Team: Patient Care Team: [facility] as PCP - General Fritzi Mandes, MD as Consulting Physician (General Surgery) Lanelle Bal, DO as Consulting Physician (Gastroenterology) Karie Soda, MD as Consulting Physician (Colon and Rectal Surgery)  Inpatient Treatment Team: Treatment Team: Attending Provider: Karie Soda, MD; Technician: Margaretmary Eddy, NT; Technician: Vella Raring, NT; Registered Nurse: Sherian Maroon, RN; Pharmacist: Cindi Carbon, [facility]   Problem List:   Principal Problem:   Colonic diverticular abscess   03/01/2023  POST-OPERATIVE DIAGNOSIS:   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  Very dense adhesions of small bowel to the retroperitoneum especially in the pelvis circumferentially.  Couple areas causing partial obstruction with stricturing especially at the distal ileum and mid ileum.  Ultimately had to resect section of small bowel with extremely dense stricture in enterotomy and thin serosal areas due to extremely dense adhesions.   Rectosigmoid resection done dense fibrotic inflammation with chronic white pigtail percutaneous drain resting in the left anterolateral pelvis near the proximal rectum and stricturing.  Phlegmon and inflammation but no active abscess.   No obvious metastatic disease on visceral parietal peritoneum or liver.   It is a 29mm EEA anastomosis ( distal descending colon  connected to mid rectum.)  It rests 9 cm from the anal verge by rigid proctoscopy.   CASE DATA: Type of patient?: Elective [facility] Private Case Status of Case? Elective Scheduled Infection Present At Time Of Surgery (PATOS)?  PHLEGMON    Assessment [facility] Stay = 1 days) 1 Day Post-Op    Recovering okay so far    Plan:  ERAS protocol Advance diet Stop IV fluids.  As needed boluses for backup.  Multimodal pain control.  Continue scheduled Tylenol.  Add scheduled gabapentin.  Methocarbamol/tramadol/hydromorphone for breakthrough pain.  Follow-up in pathology.  Most likely consistent with diverticular disease  -VTE prophylaxis- SCDs, etc -mobilize as tolerated to help recovery -Disposition:  Disposition:  The patient is from: Home Anticipate discharge to:  Home Anticipated Date of Discharge is:  June 29,2024   Barriers to discharge:  Pending Clinical improvement (more likely than not)  Patient currently is NOT MEDICALLY STABLE for discharge from the hospital from a surgery standpoint.      I reviewed nursing notes, last 24 h vitals and pain scores, last 48 h intake and output, last 24 h labs and trends, and last 24 h imaging results.  I have reviewed this patient's available data, including medical history, events of note, test results, etc as part of my evaluation.   A significant portion of that time was spent in counseling. Care during the described time interval was provided by me.  This care required moderate level of medical decision making.  03/02/2023    Subjective: (Chief complaint)  Patient some soreness getting out of bed.  Requiring IV medication.  Tolerating clear liquids.  No nausea or vomiting.  No events last night.  Objective:  Vital signs:  Vitals:   03/01/23 2056 03/02/23 0146 03/02/23 0500 03/02/23 0553   BP: 126/63 117/61  (!) 116/56  Pulse: 63 71  70  Resp: 18 18  17   Temp: (!) 97.5 F (36.4 C) 97.6 F (36.4 C)  (!) 97.3 F (36.3 C)  TempSrc: Oral Oral  Oral  SpO2: 99% 94%  96%  Weight:   71.4 kg   Height:        Last BM Date : 02/28/23  Intake/Output   Yesterday:  06/26 0701 - 06/27 0700 In: 3584.7 [P.O.:300; I.V.:3084.8; IV Piggyback:199.9] Out: 2865 [Urine:2250; Drains:415; Blood:200] This shift:  No intake/output data recorded.  Bowel function:  Flatus: No  BM:  No  Drain: Serosanguinous   Physical Exam:  General: Pt awake/alert in no acute distress Eyes: PERRL, normal EOM.  Sclera clear.  No icterus Neuro: CN II-XII intact w/o focal sensory/motor deficits. Lymph: No head/neck/groin lymphadenopathy Psych:  No delerium/psychosis/paranoia.  Oriented x 4 HENT: Normocephalic, Mucus membranes moist.  No thrush Neck: Supple, No tracheal deviation.  No obvious thyromegaly Chest: No pain to chest wall compression.  Good respiratory excursion.  No audible wheezing CV:  Pulses intact.  Regular rhythm.  No major extremity edema MS: Normal AROM mjr joints.  No obvious deformity  Abdomen: Soft.  Nondistended.  Mildly tender at incisions only.  No evidence of peritonitis.  No incarcerated hernias.  Ext:   No deformity.  No mjr edema.  No cyanosis Skin: No petechiae / purpurea.  No major sores.  Warm and dry    Results:   Cultures: No results found for this or any previous visit (from the past 720 hour(s)).  Labs: Results for orders placed or performed during the hospital encounter of 03/01/23 (from the past 48 hour(s))  Basic metabolic panel     Status: Abnormal   Collection Time: 03/02/23  4:56 AM  Result Value Ref Range   Sodium 137 135 - 145 mmol/L   Potassium 4.1 3.5 - 5.1 mmol/L   Chloride 103 98 - 111 mmol/L   CO2 29 22 - 32 mmol/L   Glucose, Bld 113 (H) 70 - 99 mg/dL    Comment: Glucose reference range applies only to samples taken after fasting for at  least 8 hours.   BUN 6 (L) 8 - 23 mg/dL   Creatinine, Ser 1.61 0.44 - 1.00 mg/dL   Calcium 9.0 8.9 - 09.6 mg/dL   GFR, Estimated >04 >54 mL/min    Comment: (NOTE) Calculated using the CKD-EPI Creatinine Equation (2021)    Anion gap 5 5 - 15    Comment: Performed at [facility], [address], [place], [place] [address]  CBC     Status: Abnormal   Collection Time: 03/02/23  4:56 AM  Result Value Ref Range   WBC 13.5 (H) 4.0 - 10.5 K/uL   RBC 3.93 3.87 - 5.11 MIL/uL   Hemoglobin 11.9 (L) 12.0 - 15.0 g/dL   HCT 91.4 78.2 - 95.6 %   MCV 93.1 80.0 - 100.0 fL   MCH 30.3 26.0 - 34.0 pg   MCHC 32.5 30.0 - 36.0 g/dL   RDW 21.3 08.6 - 57.8 %   Platelets 252 150 - 400 K/uL   nRBC 0.0 0.0 - 0.2 %    Comment: Performed at [facility], [address], [place], [place] [address]  Magnesium     Status: None   Collection Time: 03/02/23  4:56 AM  Result Value Ref Range   Magnesium 1.8 1.7 - 2.4 mg/dL    Comment: Performed at [facility], [address] [place], [place] [address]    Imaging / Studies: DG C-Arm 1-60 Min-No Report  Result Date: 03/01/2023 Fluoroscopy was utilized by the requesting physician.  No radiographic interpretation.    Medications / Allergies: per chart  Antibiotics: Anti-infectives (From admission, onward)    Start     Dose/Rate Route Frequency Ordered Stop   03/01/23 2200  cefoTEtan (CEFOTAN) 2 g in sodium chloride 0.9 % 100 mL IVPB        2 g 200 mL/hr over 30 Minutes Intravenous Every 12 hours 03/01/23 1920 03/01/23 2203   03/01/23 1400  neomycin (MYCIFRADIN) tablet 1,000 mg  Status:  Discontinued       See Hyperspace for full Linked Orders Report.   1,000 mg Oral 3 times per day 03/01/23 1125 03/01/23 1130   03/01/23 1400  metroNIDAZOLE (FLAGYL) tablet 1,000 mg  Status:  Discontinued       See Hyperspace for full Linked Orders Report.   1,000 mg Oral 3 times per day 03/01/23 1125 03/01/23 1130    03/01/23 1149  metroNIDAZOLE (FLAGYL) 500 MG/100ML IVPB  Status:  Discontinued       Note to Pharmacy: Kathrynn Speed H: cabinet override      03/01/23 1149 03/01/23 1155   03/01/23 1130  cefoTEtan (CEFOTAN) 2 g in sodium chloride 0.9 % 100 mL IVPB        2 g 200 mL/hr over 30 Minutes Intravenous On call to O.R. 03/01/23 1125 03/01/23 1300         Note: Portions of this report may have been transcribed using voice recognition software. Every effort was made to ensure accuracy; however, inadvertent computerized transcription errors may be present.   Any transcriptional errors that result from this process are unintentional.    Ardeth Sportsman, MD, FACS, MASCRS Esophageal, Gastrointestinal & Colorectal Surgery Robotic and Minimally Invasive Surgery  [facility] [address] [place], [place] [address] [phone] Fax [phone] Main  CONTACT INFORMATION:  Weekday (9AM-5PM): Call [facility] main office at [phone]  Weeknight (5PM-9AM) or Weekend/Holiday: Check [email or web address] (password " [email or web address]") for General Surgery [facility] coverage  (Please, do not use SecureChat as it is not reliable communication to reach operating surgeons for immediate patient care given surgeries/outpatient duties/clinic/cross-coverage/off post-call which would lead to a delay in care.  Epic staff messaging available for outptient concerns, but may not be answered for 48 hours or more).     03/02/2023  7:25 AM

## 2023-03-03 MED ORDER — GABAPENTIN 400 MG PO CAPS
400.0000 mg | ORAL_CAPSULE | Freq: Three times a day (TID) | ORAL | Status: DC
  Administered 2023-03-03 – 2023-03-04 (×4): 400 mg via ORAL
  Filled 2023-03-03 (×4): qty 1

## 2023-03-03 MED ORDER — METHOCARBAMOL 500 MG PO TABS
1000.0000 mg | ORAL_TABLET | Freq: Four times a day (QID) | ORAL | Status: DC
  Administered 2023-03-03 – 2023-03-04 (×5): 1000 mg via ORAL
  Filled 2023-03-03 (×5): qty 2

## 2023-03-03 MED ORDER — OXYCODONE HCL 5 MG PO TABS
5.0000 mg | ORAL_TABLET | ORAL | Status: DC | PRN
  Administered 2023-03-03 – 2023-03-04 (×4): 10 mg via ORAL
  Filled 2023-03-03 (×4): qty 2

## 2023-03-03 NOTE — Progress Notes (Signed)
03/03/2023  Judith Miller [id number] 07-Nov-1960  CARE TEAM: PCP: [facility]  Outpatient Care Team: Patient Care Team: [facility] as PCP - General Fritzi Mandes, MD as Consulting Physician (General Surgery) Lanelle Bal, DO as Consulting Physician (Gastroenterology) Karie Soda, MD as Consulting Physician (Colon and Rectal Surgery)  Inpatient Treatment Team: Treatment Team: Attending Provider: Karie Soda, MD; Registered Nurse: Donavan Burnet, RN; Registered Nurse: Cindee Salt, RN; Pharmacist: Cindi Carbon, [facility]; Utilization Review: Clifton James, RN; Mobility Specialist: Lorina Rabon   Problem List:   Principal Problem:   Colonic diverticular abscess Active Problems:   Perforation of sigmoid colon (HCC)   Bipolar disorder (HCC)   03/01/2023  POST-OPERATIVE DIAGNOSIS:   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  Very dense adhesions of small bowel to the retroperitoneum especially in the pelvis circumferentially.  Couple areas causing partial obstruction with stricturing especially at the distal ileum and mid ileum.  Ultimately had to resect section of small bowel with extremely dense stricture in enterotomy and thin serosal areas due to extremely dense adhesions.   Rectosigmoid resection done dense fibrotic inflammation with chronic white pigtail percutaneous drain resting in  the left anterolateral pelvis near the proximal rectum and stricturing.  Phlegmon and inflammation but no active abscess.  No obvious metastatic disease on visceral parietal peritoneum or liver.   It is a 29mm EEA anastomosis ( distal descending colon  connected to mid rectum.)  It rests 9 cm from the anal verge by rigid proctoscopy.   CASE DATA: Type of patient?: Elective [facility] Private Case Status of Case? Elective Scheduled Infection Present At Time Of Surgery (PATOS)?  PHLEGMON    Assessment [facility] Stay = 2 days) 2 Days Post-Op    Recovering okay so far    Plan:  ERAS protocol Advance diet - solids ML IVF.  As needed boluses for backup.  Multimodal pain control.  Continue scheduled Tylenol increase scheduled gabapentin.  Add methocarbamol as scheduled.  Switch from tramadol to oxycodone.  Hydromorphone for breakthrough pain.  Follow-up in pathology.  Most likely consistent with diverticular disease  -VTE prophylaxis- SCDs, enoxaparin etc  -mobilize as tolerated to help recovery  -Disposition:  Disposition:  The patient is from: Home Anticipate discharge to:  Home Anticipated Date of Discharge is:  June 29,2024   Barriers to discharge:  Pending Clinical improvement (more likely than not)  Patient currently is NOT MEDICALLY STABLE for discharge from the hospital from a surgery standpoint.      I reviewed nursing notes, last 24 h vitals and pain scores, last 48 h intake and output, last 24 h labs and trends, and last 24 h imaging results.  I have reviewed this patient's available data, including medical history, events of note, test results, etc as part of my evaluation.   A significant portion of that time was spent in counseling. Care during the described time interval was provided by me.  This care required moderate level of medical decision making.  03/03/2023    Subjective: (Chief complaint)  Patient has some flatus.  Had loose bowel movement with some  blood in it.  Anxious that her pain has not gone away yet.  Needing IV medications.  Does not feel tramadol works very well.  Objective:  Vital signs:  Vitals:   03/02/23 0940 03/02/23 1333 03/02/23 2152 03/03/23 0543  BP: (!) 104/53 (!) 113/57 115/68 125/60  Pulse: 62 74 78 87  Resp: 18 18 18 18   Temp: 98.3 F (36.8 C) 98.1 F (36.7 C) 98.1 F (36.7 C) 98 F (36.7 C)  TempSrc: Oral Oral Oral Oral  SpO2: 96% 98% 96% 95%  Weight:      Height:        Last BM Date : 02/28/23  Intake/Output   Yesterday:  06/27 0701 - 06/28 0700 In: 723 [P.O.:720; I.V.:3] Out: 1340 [Urine:1100; Drains:240] This shift:  No intake/output data recorded.  Bowel function:  Flatus: YES  BM:  No  Drain: Serosanguinous   Physical Exam:  General: Pt awake/alert in no acute distress Eyes: PERRL, normal EOM.  Sclera clear.  No icterus Neuro: CN II-XII intact w/o focal sensory/motor deficits. Lymph: No head/neck/groin lymphadenopathy Psych:  No delerium/psychosis/paranoia.  Mildly anxious.  Interrupts often but mostly redirectable.  Oriented x 4 HENT: Normocephalic, Mucus membranes moist.  No thrush Neck: Supple, No tracheal deviation.  No obvious thyromegaly Chest: No pain to chest wall compression.  Good respiratory excursion.  No audible wheezing CV:  Pulses intact.  Regular rhythm.  No major extremity edema MS: Normal AROM mjr joints.  No obvious deformity  Abdomen: Soft.  Nondistended.  Mildly tender at incisions only.  No evidence of peritonitis.  No incarcerated hernias.  Ext:   No deformity.  No mjr edema.  No cyanosis Skin: No petechiae / purpurea.  No major sores.  Warm and dry    Results:   Cultures: No results found for this or any previous visit (from the past 720 hour(s)).  Labs: Results for orders placed or performed during the hospital encounter of 03/01/23 (from the past 48 hour(s))  Basic metabolic panel     Status: Abnormal   Collection Time: 03/02/23  4:56 AM   Result Value Ref Range   Sodium 137 135 - 145 mmol/L   Potassium 4.1 3.5 - 5.1 mmol/L   Chloride 103 98 - 111 mmol/L   CO2 29 22 - 32 mmol/L   Glucose, Bld 113 (H) 70 - 99 mg/dL    Comment: Glucose reference range applies only to samples taken after fasting for at least 8 hours.   BUN 6 (L) 8 - 23 mg/dL   Creatinine, Ser 4.09 0.44 - 1.00 mg/dL   Calcium 9.0 8.9 - 81.1 mg/dL   GFR, Estimated >91 >47 mL/min    Comment: (NOTE) Calculated using the CKD-EPI Creatinine Equation (2021)    Anion gap 5 5 - 15    Comment: Performed at [facility], [address] [place], [place] [address]  CBC     Status: Abnormal   Collection Time: 03/02/23  4:56 AM  Result Value Ref Range   WBC 13.5 (H) 4.0 - 10.5 K/uL   RBC 3.93 3.87 - 5.11 MIL/uL   Hemoglobin 11.9 (L) 12.0 - 15.0 g/dL   HCT 21.3 08.6 - 57.8 %   MCV 93.1 80.0 - 100.0 fL   MCH 30.3 26.0 - 34.0 pg   MCHC 32.5 30.0 - 36.0 g/dL   RDW 13.1  11.5 - 15.5 %   Platelets 252 150 - 400 K/uL   nRBC 0.0 0.0 - 0.2 %    Comment: Performed at [facility], [address], [place], [place] [address]  Magnesium     Status: None   Collection Time: 03/02/23  4:56 AM  Result Value Ref Range   Magnesium 1.8 1.7 - 2.4 mg/dL    Comment: Performed at [facility], [address] [place], [place] [address]    Imaging / Studies: DG C-Arm 1-60 Min-No Report  Result Date: 03/01/2023 Fluoroscopy was utilized by the requesting physician.  No radiographic interpretation.    Medications / Allergies: per chart  Antibiotics: Anti-infectives (From admission, onward)    Start     Dose/Rate Route Frequency Ordered Stop   03/01/23 2200  cefoTEtan (CEFOTAN) 2 g in sodium chloride 0.9 % 100 mL IVPB        2 g 200 mL/hr over 30 Minutes Intravenous Every 12 hours 03/01/23 1920 03/01/23 2203   03/01/23 1400  neomycin (MYCIFRADIN) tablet 1,000 mg  Status:  Discontinued       See Hyperspace for full Linked  Orders Report.   1,000 mg Oral 3 times per day 03/01/23 1125 03/01/23 1130   03/01/23 1400  metroNIDAZOLE (FLAGYL) tablet 1,000 mg  Status:  Discontinued       See Hyperspace for full Linked Orders Report.   1,000 mg Oral 3 times per day 03/01/23 1125 03/01/23 1130   03/01/23 1149  metroNIDAZOLE (FLAGYL) 500 MG/100ML IVPB  Status:  Discontinued       Note to Pharmacy: Kathrynn Speed H: cabinet override      03/01/23 1149 03/01/23 1155   03/01/23 1130  cefoTEtan (CEFOTAN) 2 g in sodium chloride 0.9 % 100 mL IVPB        2 g 200 mL/hr over 30 Minutes Intravenous On call to O.R. 03/01/23 1125 03/01/23 1300         Note: Portions of this report may have been transcribed using voice recognition software. Every effort was made to ensure accuracy; however, inadvertent computerized transcription errors may be present.   Any transcriptional errors that result from this process are unintentional.    Ardeth Sportsman, MD, FACS, MASCRS Esophageal, Gastrointestinal & Colorectal Surgery Robotic and Minimally Invasive Surgery  [facility] [address] [place], [place] [address] [phone] Fax [phone] Main  CONTACT INFORMATION:  Weekday (9AM-5PM): Call [facility] main office at [phone]  Weeknight (5PM-9AM) or Weekend/Holiday: Check [email or web address] (password " [email or web address]") for General Surgery [facility] coverage  (Please, do not use SecureChat as it is not reliable communication to reach operating surgeons for immediate patient care given surgeries/outpatient duties/clinic/cross-coverage/off post-call which would lead to a delay in care.  Epic staff messaging available for outptient concerns, but may not be answered for 48 hours or more).     03/03/2023  7:57 AM

## 2023-03-03 NOTE — Anesthesia Postprocedure Evaluation (Signed)
Anesthesia Post Note  Patient: KAHNIYA OVERBAY  Procedure(s) Performed: ROBOTIC RESECTION OF RECTOSIGMOID COLON, TAKEDOWN OF LOOP COLOSTOMY, LYSIS OF ADHESIONS, APPENDECTOMY, SMALL BOWEL RESECTION, BILATERAL TAP BLOCK, TISSUE PERFUSION ASSESSMENT VIA FIREFLY INJECTION FLEXIBLE SIGMOIDOSCOPY CYSTOSCOPY with FIREFLY INJECTION     Patient location during evaluation: PACU Anesthesia Type: General Level of consciousness: awake and alert Pain management: pain level controlled Vital Signs Assessment: post-procedure vital signs reviewed and stable Respiratory status: spontaneous breathing, nonlabored ventilation, respiratory function stable and patient connected to nasal cannula oxygen Cardiovascular status: blood pressure returned to baseline and stable Postop Assessment: no apparent nausea or vomiting Anesthetic complications: no  No notable events documented.  Last Vitals:  Vitals:   03/02/23 2152 03/03/23 0543  BP: 115/68 125/60  Pulse: 78 87  Resp: 18 18  Temp: 36.7 C 36.7 C  SpO2: 96% 95%    Last Pain:  Vitals:   03/03/23 0631  TempSrc:   PainSc: Asleep                 Jerald Hennington S

## 2023-03-03 NOTE — Progress Notes (Signed)
Mobility Specialist - Progress Note   03/03/23 1136  Mobility  Activity Ambulated independently in hallway  Level of Assistance Independent  Assistive Device None  Distance Ambulated (ft) 480 ft  Activity Response Tolerated well  Mobility Referral Yes  $Mobility charge 1 Mobility  Mobility Specialist Stop Time (ACUTE ONLY) 1135   Pt received in bed and agreeable to mobility. C/o pain near incisions. No other complaints during session. Pt to hallway independently after session with all needs met.    [facility]

## 2023-03-04 MED ORDER — METHOCARBAMOL 500 MG PO TABS: 500.0000 mg | ORAL_TABLET | Freq: Three times a day (TID) | ORAL | 1 refills | Status: AC | PRN

## 2023-03-04 MED ORDER — OXYCODONE HCL 5 MG PO TABS: 5.0000 mg | ORAL_TABLET | Freq: Four times a day (QID) | ORAL | 0 refills | Status: AC | PRN

## 2023-03-04 NOTE — Progress Notes (Signed)
Judith Miller to be D/C'd Home per MD order.  Discussed with the patient and all questions fully answered.  VSS, Skin clean, dry and intact without evidence of skin break down, no evidence of skin tears noted. IV catheters, JP drain, and surgical dressings removed per order. Sites without signs and symptoms of complications. Dressing and pressure applied.  An After Visit Summary was printed and given to the patient. Patient prescriptions sent to pharmacy.  D/c education completed with patient/family including follow up instructions, medication list, d/c activities limitations if indicated, with other d/c instructions as indicated by MD - patient able to verbalize understanding, all questions fully answered.   Patient instructed to return to ED, call 911, or call MD for any changes in condition.   Patient escorted via WC, and D/C home via private auto.  Judith Miller 03/04/2023 11:14 AM

## 2023-03-04 NOTE — Discharge Summary (Addendum)
Physician Discharge Summary    Patient ID: ATIRA FRAZZINI MRN: [id number] DOB/AGE: 04/01/1961  62 y.o.  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  Very dense adhesions of small bowel to the retroperitoneum especially in the pelvis circumferentially.  Couple areas causing partial obstruction with stricturing especially at the distal ileum and mid ileum.  Ultimately had to resect section of small bowel with extremely dense stricture in enterotomy and thin serosal areas due to extremely dense adhesions.   Rectosigmoid resection done dense fibrotic inflammation with chronic white pigtail percutaneous drain resting in the left anterolateral pelvis near the proximal rectum and stricturing.  Phlegmon and inflammation but no active abscess.  No obvious metastatic disease on visceral parietal  peritoneum or liver.   It is a 29mm EEA anastomosis ( distal descending colon  connected to mid rectum.)  It rests 9 cm from the anal verge by rigid proctoscopy.   CASE DATA: Type of patient?: Elective [facility] Private Case Status of Case? Elective Scheduled Infection Present At Time Of Surgery (PATOS)?  PHLEGMON  Consults: Case Management / Social Work, Nutrition, Anesthesia, and Urology  Hospital Course:   The patient underwent the surgery above.  Postoperatively, the patient gradually mobilized and advanced to a solid diet.  Pain and other symptoms were treated aggressively.    By the time of discharge, the patient was walking well the hallways, eating food, having flatus.  Pain was well-controlled on an oral medications.  Based on meeting discharge criteria and continuing to recover, I felt it was safe for the patient to be discharged from the hospital to further recover with close followup. Postoperative recommendations were discussed in detail.  They are written as well.  Discharged Condition: good  Discharge Exam: Blood pressure (!) 140/70, pulse 81, temperature 98.2 F (36.8 C), temperature source Oral, resp. rate 18, height 5\' 6"  (1.676 m), weight 76.1 kg, SpO2 96 %.  General: Pt awake/alert/oriented x4 in No acute distress Eyes: PERRL, normal EOM.  Sclera clear.  No icterus Neuro: CN II-XII intact w/o focal sensory/motor deficits. Lymph: No head/neck/groin lymphadenopathy Psych:  No delerium/psychosis/paranoia.  Some baseline anxiety but consolable. HENT: Normocephalic, Mucus membranes moist.  No thrush Neck: Supple, No tracheal deviation Chest:  No chest wall pain w good excursion CV:  Pulses intact.  Regular rhythm MS: Normal AROM mjr joints.  No obvious deformity Abdomen: Soft.  Nondistended.  Nontender.  Mild sensitivity to old colostomy site only.  Dressings clean dry and intact.  Drain serosanguineous.  Both to be removed at discharge.  No evidence of peritonitis.  No  incarcerated hernias. Ext:  SCDs BLE.  No mjr edema.  No cyanosis Skin: No petechiae / purpura   Disposition:    Follow-up Information     Karie Soda, MD Follow up on 03/28/2023.   Specialties: General Surgery, Colon and Rectal Surgery Why: To follow up after your operation Contact information: [address] [place] [address] [phone]                 Discharge disposition: 01-Home or Self Care       Discharge Instructions     Call MD for:   Complete by: As directed    FEVER > 101.5 F  (temperatures < 101.5 F are not significant)   Call MD for:  extreme fatigue   Complete by: As directed    Call MD for:  persistant dizziness or light-headedness   Complete by: As directed    Call MD for:  persistant nausea and vomiting   Complete by: As directed    Call MD for:  redness, tenderness, or signs of infection (pain, swelling, redness, odor or green/yellow discharge around incision site)   Complete by: As directed    Call MD for:  severe uncontrolled pain   Complete by: As directed    Diet - low sodium heart healthy   Complete by: As directed    Start with a bland diet such as soups, liquids, starchy foods, low fat foods, etc. the first few days at home. Gradually advance to a solid, low-fat, high fiber diet by the end of the first week at home.   Add a fiber supplement to your diet (Metamucil, etc) If you feel full, bloated, or constipated, stay on a full liquid or pureed/blenderized diet for a few days until you feel better and are no longer constipated.   Discharge instructions   Complete by: As directed    See Discharge Instructions If you are not getting better after two weeks or are noticing you are getting worse, contact our office [phone] for further advice.  We may need to adjust your medications, re-evaluate you in the office, send you to the emergency room, or see what other things we can do to help. The clinic staff is  available to answer your questions during regular business hours (8:30am-5pm).  Please don't hesitate to call and ask to speak to one of our nurses for clinical concerns.    A surgeon from [facility] is always on call at the hospitals 24 hours/day If you have a medical emergency, go to the nearest emergency room or call 911.   Discharge wound care:   Complete by: As directed    It is good for closed incisions and even open wounds to be washed every day.  Shower every day.  Short baths are fine.  Wash the incisions and wounds clean with soap & water.    You may leave closed incisions open to air if it is dry.   You may cover the incision with clean gauze & replace it after your daily shower for comfort.  TEGADERM:  You have clear gauze band-aid dressings over your closed incision(s).  Remove the dressings 3 days after surgery.  Remove any shoelace wicks out of incision as well   Driving Restrictions   Complete by: As directed    You may drive when: - you are no longer taking narcotic prescription  pain medication - you can comfortably wear a seatbelt - you can safely make sudden turns/stops without pain.   Increase activity slowly   Complete by: As directed    Start light daily activities --- self-care, walking, climbing stairs- beginning the day after surgery.  Gradually increase activities as tolerated.  Control your pain to be active.  Stop when you are tired.  Ideally, walk several times a day, eventually an hour a day.   Most people are back to most day-to-day activities in a few weeks.  It takes 4-6 weeks to get back to unrestricted, intense activity. If you can walk 30 minutes without difficulty, it is safe to try more intense activity such as jogging, treadmill, bicycling, low-impact aerobics, swimming, etc. Save the most intensive and strenuous activity for last (Usually 4-8 weeks after surgery) such as sit-ups, heavy lifting, contact sports, etc.  Refrain from any intense  heavy lifting or straining until you are off narcotics for pain control.  You will have off days, but things should improve week-by-week. DO NOT PUSH THROUGH PAIN.  Let pain be your guide: If it hurts to do something, don't do it.   Lifting restrictions   Complete by: As directed    If you can walk 30 minutes without difficulty, it is safe to try more intense activity such as jogging, treadmill, bicycling, low-impact aerobics, swimming, etc. Save the most intensive and strenuous activity for last (Usually 4-8 weeks after surgery) such as sit-ups, heavy lifting, contact sports, etc.   Refrain from any intense heavy lifting or straining until you are off narcotics for pain control.  You will have off days, but things should improve week-by-week. DO NOT PUSH THROUGH PAIN.  Let pain be your guide: If it hurts to do something, don't do it.  Pain is your body warning you to avoid that activity for another week until the pain goes down.   May shower / Bathe   Complete by: As directed    May walk up steps   Complete by: As directed    Remove dressing in 72 hours   Complete by: As directed    Make sure all dressings are removed by the third day after surgery.  Leave incisions open to air.  OK to cover incisions with gauze or bandages as desired   Sexual Activity Restrictions   Complete by: As directed    You may have sexual intercourse when it is comfortable. If it hurts to do something, stop.       Allergies as of 03/04/2023       Reactions   Other Other (See Comments)   Diverticulitis- NO FOODS WITH SEEDS, NUTS, POPCORN!!   Sulfa Antibiotics Other (See Comments)   Headaches        Medication List     TAKE these medications    acetaminophen 500 MG tablet Commonly known as: TYLENOL Take 500 mg by mouth every 6 (six) hours as needed for mild pain or moderate pain.   BEET ROOT PO Take 1,000 mg by mouth daily.   Co Q-10 100 MG Caps Take 100 mg by mouth 2 (two) times daily.    COLLAGEN PO Take 3 g by mouth daily.   levocetirizine 5 MG tablet Commonly known as: XYZAL Take 5 mg by mouth every evening.   Magnesium 250 MG Tabs Take 250 mg by mouth in the morning.   methocarbamol 500 MG tablet Commonly known as: ROBAXIN Take 1 tablet (500 mg total) by  mouth every 8 (eight) hours as needed for muscle spasms.   multivitamin tablet Take 1 tablet by mouth daily.   Normal Saline Flush 0.9 % Soln Inject 10 MLs by intracatheter daily   Normal Saline Flush 0.9 % Soln Flush with 10 mLs by Intracatheter route daily.   OVER THE COUNTER MEDICATION Take 1 tablet by mouth daily. Brain & Memory Performance   oxyCODONE 5 MG immediate release tablet Commonly known as: Oxy IR/ROXICODONE Take 1-2 tablets (5-10 mg total) by mouth every 6 (six) hours as needed for moderate pain, severe pain or breakthrough pain.   Potassium 99 MG Tabs Take 99 mg by mouth daily.   Turmeric 500 MG Caps Take 500 mg by mouth daily.   VITAMIN B-12 PO Take 2,500 mcg by mouth daily.   Vitamin D3 10 MCG (400 UNIT) tablet Take 400 Units by mouth daily.   vitamin E 180 MG (400 UNITS) capsule Take 400 Units by mouth daily.               Discharge Care Instructions  (From admission, onward)           Start     Ordered   03/01/23 0000  Discharge wound care:       Comments: It is good for closed incisions and even open wounds to be washed every day.  Shower every day.  Short baths are fine.  Wash the incisions and wounds clean with soap & water.    You may leave closed incisions open to air if it is dry.   You may cover the incision with clean gauze & replace it after your daily shower for comfort.  TEGADERM:  You have clear gauze band-aid dressings over your closed incision(s).  Remove the dressings 3 days after surgery.  Remove any shoelace wicks out of incision as well   03/01/23 1215            Significant Diagnostic Studies:  Results for orders placed or  performed during the hospital encounter of 03/01/23 (from the past 72 hour(s))  Basic metabolic panel     Status: Abnormal   Collection Time: 03/02/23  4:56 AM  Result Value Ref Range   Sodium 137 135 - 145 mmol/L   Potassium 4.1 3.5 - 5.1 mmol/L   Chloride 103 98 - 111 mmol/L   CO2 29 22 - 32 mmol/L   Glucose, Bld 113 (H) 70 - 99 mg/dL    Comment: Glucose reference range applies only to samples taken after fasting for at least 8 hours.   BUN 6 (L) 8 - 23 mg/dL   Creatinine, Ser 5.40 0.44 - 1.00 mg/dL   Calcium 9.0 8.9 - 98.1 mg/dL   GFR, Estimated >19 >14 mL/min    Comment: (NOTE) Calculated using the CKD-EPI Creatinine Equation (2021)    Anion gap 5 5 - 15    Comment: Performed at [facility], [address], [place], [place] [address]  CBC     Status: Abnormal   Collection Time: 03/02/23  4:56 AM  Result Value Ref Range   WBC 13.5 (H) 4.0 - 10.5 K/uL   RBC 3.93 3.87 - 5.11 MIL/uL   Hemoglobin 11.9 (L) 12.0 - 15.0 g/dL   HCT 62.1 30.8 - 65.7 %   MCV 93.1 80.0 - 100.0 fL   MCH 30.3 26.0 - 34.0 pg   MCHC 32.5 30.0 - 36.0 g/dL   RDW 84.6 96.2 - 95.2 %   Platelets  252 150 - 400 K/uL   nRBC 0.0 0.0 - 0.2 %    Comment: Performed at [facility], [address] [place], [place] [address]  Magnesium     Status: None   Collection Time: 03/02/23  4:56 AM  Result Value Ref Range   Magnesium 1.8 1.7 - 2.4 mg/dL    Comment: Performed at [facility], [address] [place], [place] [address]    DG C-Arm 1-60 Min-No Report  Result Date: 03/01/2023 Fluoroscopy was utilized by the requesting physician.  No radiographic interpretation.    Past Medical History:  Diagnosis Date   Arthritis    Bipolar disorder (HCC)    Colostomy status (HCC)    Deviated septum    Diverticulitis of colon with perforation    Fistula of intestine    Headache    sinus   Pelvic abscess in female 03/23/2022    Past Surgical History:   Procedure Laterality Date   BOWEL RESECTION N/A 12/15/2021   Procedure: SMALL BOWEL RESECTION;  Surgeon: Fritzi Mandes, MD;  Location: [facility] OR;  Service: General;  Laterality: N/A;   CESAREAN SECTION  1989   COLONOSCOPY WITH PROPOFOL N/A 09/12/2022   Procedure: COLONOSCOPY WITH PROPOFOL;  Surgeon: Lanelle Bal, DO;  Location: [facility] SUITE;  Service: Endoscopy;  Laterality: N/A;  2:00 pm, pt unsure if she will have a ride   COLONOSCOPY WITH PROPOFOL N/A 12/27/2022   Procedure: COLONOSCOPY WITH PROPOFOL;  Surgeon: Lanelle Bal, DO;  Location: [facility];  Service: Endoscopy;  Laterality: N/A;  11:15am, asa 2, via colostomy/rectum   FLEXIBLE SIGMOIDOSCOPY N/A 03/01/2023   Procedure: FLEXIBLE SIGMOIDOSCOPY;  Surgeon: Karie Soda, MD;  Location: [facility] ORS;  Service: General;  Laterality: N/A;   IR CATHETER TUBE CHANGE  12/27/2021   IR CATHETER TUBE CHANGE  03/24/2022   IR RADIOLOGIST EVAL & MGMT  01/26/2022   IR RADIOLOGIST EVAL & MGMT  04/14/2022   IR RADIOLOGIST EVAL & MGMT  06/08/2022   KNEE ARTHROSCOPY Left    LAPAROTOMY N/A 12/15/2021   Procedure: EXPLORATORY LAPAROTOMY;  Surgeon: Fritzi Mandes, MD;  Location: [facility] OR;  Service: General;  Laterality: N/A;   POLYPECTOMY  12/27/2022   Procedure: POLYPECTOMY;  Surgeon: Lanelle Bal, DO;  [facility];  Service: Endoscopy;;   TRANSVERSE LOOP COLOSTOMY N/A 12/15/2021   Procedure: TRANSVERSE LOOP COLOSTOMY;  Surgeon: Fritzi Mandes, MD;  Location: [facility] OR;  Service: General;  Laterality: N/A;   UPPER GI ENDOSCOPY     XI ROBOTIC ASSISTED SMALL BOWEL RESECTION N/A 03/01/2023   Procedure: ROBOTIC RESECTION OF RECTOSIGMOID COLON, TAKEDOWN OF LOOP COLOSTOMY, LYSIS OF ADHESIONS, APPENDECTOMY, SMALL BOWEL RESECTION, BILATERAL TAP BLOCK, TISSUE PERFUSION ASSESSMENT VIA FIREFLY INJECTION;  Surgeon: Karie Soda, MD;  Location: [facility]S;  Service: General;  Laterality: N/A;  GEN w/ERAS PATHWAY EXPEREL    Social History    Socioeconomic History   Marital status: Single    Spouse name: Not on file   Number of children: Not on file   Years of education: Not on file   Highest education level: Not on file  Occupational History   Not on file  Tobacco Use   Smoking status: Former    Packs/day: 0.50    Years: 40.00    Additional pack years: 0.00    Total pack years: 20.00    Types: Cigarettes    Quit date: 12/18/2022    Years since  quitting: 0.2   Smokeless tobacco: Never  Vaping Use   Vaping Use: Never used  Substance and Sexual Activity   Alcohol use: Never   Drug use: Never   Sexual activity: Not on file  Other Topics Concern   Not on file  Social History Narrative   Not on file   Social Determinants of Health   Financial Resource Strain: Not on file  Food Insecurity: No Food Insecurity (03/01/2023)   Hunger Vital Sign    Worried About Running Out of Food in the Last Year: Never true    Ran Out of Food in the Last Year: Never true  Transportation Needs: No Transportation Needs (03/01/2023)   PRAPARE - Administrator, Civil Service (Medical): No    Lack of Transportation (Non-Medical): No  Physical Activity: Not on file  Stress: Not on file  Social Connections: Not on file  Intimate Partner Violence: Not At Risk (03/01/2023)   Humiliation, Afraid, Rape, and Kick questionnaire    Fear of Current or Ex-Partner: No    Emotionally Abused: No    Physically Abused: No    Sexually Abused: No    Family History  Problem Relation Age of Onset   Colon cancer Neg Hx     Current Facility-Administered Medications  Medication Dose Route Frequency Provider Last Rate Last Admin   0.9 %  sodium chloride infusion  250 mL Intravenous PRN Karie Soda, MD       acetaminophen (TYLENOL) tablet 1,000 mg  1,000 mg Oral Q6H Karie Soda, MD   1,000 mg at 03/04/23 0554   albuterol (PROVENTIL) (2.5 MG/3ML) 0.083% nebulizer solution 2.5 mg  2.5 mg Nebulization Q6H PRN Karie Soda, MD        alum & mag hydroxide-simeth (MAALOX/MYLANTA) 200-200-20 MG/5ML suspension 30 mL  30 mL Oral Q6H PRN Karie Soda, MD       cholecalciferol (VITAMIN D3) 10 MCG (400 UNIT) tablet 400 Units  400 Units Oral Daily Karie Soda, MD   400 Units at 03/04/23 0804   diphenhydrAMINE (BENADRYL) 12.5 MG/5ML elixir 12.5 mg  12.5 mg Oral Q6H PRN Karie Soda, MD       Or   diphenhydrAMINE (BENADRYL) injection 12.5 mg  12.5 mg Intravenous Q6H PRN Karie Soda, MD       enoxaparin (LOVENOX) injection 40 mg  40 mg Subcutaneous Q24H Karie Soda, MD   40 mg at 03/04/23 0804   feeding supplement (ENSURE SURGERY) liquid 237 mL  237 mL Oral BID BM Karie Soda, MD   237 mL at 03/04/23 0819   gabapentin (NEURONTIN) capsule 400 mg  400 mg Oral TID Karie Soda, MD   400 mg at 03/04/23 0804   hydrALAZINE (APRESOLINE) injection 10 mg  10 mg Intravenous Q2H PRN Karie Soda, MD       HYDROmorphone (DILAUDID) injection 0.5-2 mg  0.5-2 mg Intravenous Q4H PRN Karie Soda, MD   1 mg at 03/04/23 0817   loratadine (CLARITIN) tablet 10 mg  10 mg Oral q1800 Karie Soda, MD   10 mg at 03/03/23 1816   magic mouthwash  15 mL Oral QID PRN Karie Soda, MD       melatonin tablet 3 mg  3 mg Oral QHS PRN Karie Soda, MD       menthol-cetylpyridinium (CEPACOL) lozenge 3 mg  1 lozenge Oral PRN Karie Soda, MD       methocarbamol (ROBAXIN) 1,000 mg in dextrose 5 % 100 mL IVPB  1,000  mg Intravenous Q6H PRN Karie Soda, MD       methocarbamol (ROBAXIN) tablet 1,000 mg  1,000 mg Oral QID Karie Soda, MD   1,000 mg at 03/04/23 0804   metoprolol tartrate (LOPRESSOR) injection 5 mg  5 mg Intravenous Q6H PRN Karie Soda, MD       ondansetron [facility]) tablet 4 mg  4 mg Oral Q6H PRN Karie Soda, MD       Or   ondansetron [facility]) injection 4 mg  4 mg Intravenous Q6H PRN Karie Soda, MD       oxyCODONE (Oxy IR/ROXICODONE) immediate release tablet 5-10 mg  5-10 mg Oral Q4H PRN Karie Soda, MD   10 mg at 03/04/23 0554    phenol (CHLORASEPTIC) mouth spray 2 spray  2 spray Mouth/Throat PRN Karie Soda, MD       polycarbophil (FIBERCON) tablet 625 mg  625 mg Oral BID Karie Soda, MD   625 mg at 03/04/23 8295   prochlorperazine (COMPAZINE) tablet 10 mg  10 mg Oral Q6H PRN Karie Soda, MD       Or   prochlorperazine (COMPAZINE) injection 5-10 mg  5-10 mg Intravenous Q6H PRN Karie Soda, MD       simethicone (MYLICON) chewable tablet 40 mg  40 mg Oral Q6H PRN Karie Soda, MD       sodium chloride flush (NS) 0.9 % injection 3 mL  3 mL Intravenous Catha Gosselin, MD   3 mL at 03/04/23 0805   sodium chloride flush (NS) 0.9 % injection 3 mL  3 mL Intravenous PRN Karie Soda, MD       vitamin E capsule 400 Units  400 Units Oral Daily Karie Soda, MD   400 Units at 03/04/23 0804     Allergies  Allergen Reactions   Other Other (See Comments)    Diverticulitis- NO FOODS WITH SEEDS, NUTS, POPCORN!!   Sulfa Antibiotics Other (See Comments)    Headaches     Signed:   Ardeth Sportsman, MD, FACS, MASCRS Esophageal, Gastrointestinal & Colorectal Surgery Robotic and Minimally Invasive Surgery  [facility] [address], [address] [place], [place] [address] [phone] Fax [phone] Main  CONTACT INFORMATION:  Weekday (9AM-5PM): Call [facility] main office at [phone]  Weeknight (5PM-9AM) or Weekend/Holiday: Check [email or web address] (password " [email or web address]") for General Surgery [facility] coverage  (Please, do not use SecureChat as it is not reliable communication to reach operating surgeons for immediate patient care given surgeries/outpatient duties/clinic/cross-coverage/off post-call which would lead to a delay in care.  Epic staff messaging available for outptient concerns, but may not be answered for 48 hours or more).     03/04/2023, 9:10 AM

## 2023-03-04 NOTE — Progress Notes (Signed)
Pharmacy Brief Note - Alvimopan (Entereg)  The standing order set for alvimopan (Entereg) now includes an automatic order to discontinue the drug after the patient has had a bowel movement. The change was approved by the Pharmacy & Therapeutics Committee and the Medical Executive Committee.   This patient has had bowel movements documented by nursing. Therefore, alvimopan has been discontinued. If there are questions, please contact the pharmacy at [phone].   Thank you-  Dorna Leitz, PharmD, BCPS 03/04/2023 7:50 AM

## 2023-03-06 LAB — SURGICAL PATHOLOGY

## 2023-03-11 ENCOUNTER — Encounter (HOSPITAL_COMMUNITY): Payer: Self-pay

## 2023-03-11 ENCOUNTER — Other Ambulatory Visit: Payer: Self-pay

## 2023-03-11 ENCOUNTER — Encounter (HOSPITAL_COMMUNITY): Payer: Self-pay | Admitting: Surgery

## 2023-03-11 ENCOUNTER — Inpatient Hospital Stay (HOSPITAL_COMMUNITY)
Admit: 2023-03-11 | Discharge: 2023-03-17 | DRG: 389 | Disposition: A | Discharge status: Home / Self Care | Payer: 59 | Source: Other Acute Inpatient Hospital | Attending: Surgery | Admitting: Surgery

## 2023-03-11 ENCOUNTER — Inpatient Hospital Stay (HOSPITAL_COMMUNITY): Payer: 59

## 2023-03-11 DIAGNOSIS — F419 Anxiety disorder, unspecified: Secondary | ICD-10-CM | POA: Diagnosis not present

## 2023-03-11 DIAGNOSIS — F17210 Nicotine dependence, cigarettes, uncomplicated: Secondary | ICD-10-CM | POA: Diagnosis not present

## 2023-03-11 DIAGNOSIS — R112 Nausea with vomiting, unspecified: Secondary | ICD-10-CM | POA: Diagnosis not present

## 2023-03-11 DIAGNOSIS — J342 Deviated nasal septum: Secondary | ICD-10-CM | POA: Diagnosis present

## 2023-03-11 DIAGNOSIS — K567 Ileus, unspecified: Secondary | ICD-10-CM | POA: Diagnosis not present

## 2023-03-11 DIAGNOSIS — R110 Nausea: Secondary | ICD-10-CM | POA: Diagnosis not present

## 2023-03-11 DIAGNOSIS — K56600 Partial intestinal obstruction, unspecified as to cause: Principal | ICD-10-CM | POA: Diagnosis present

## 2023-03-11 DIAGNOSIS — Z882 Allergy status to sulfonamides status: Secondary | ICD-10-CM | POA: Diagnosis not present

## 2023-03-11 DIAGNOSIS — F319 Bipolar disorder, unspecified: Secondary | ICD-10-CM | POA: Diagnosis present

## 2023-03-11 DIAGNOSIS — Z9049 Acquired absence of other specified parts of digestive tract: Secondary | ICD-10-CM | POA: Diagnosis not present

## 2023-03-11 DIAGNOSIS — R1010 Upper abdominal pain, unspecified: Secondary | ICD-10-CM | POA: Diagnosis not present

## 2023-03-11 DIAGNOSIS — K219 Gastro-esophageal reflux disease without esophagitis: Secondary | ICD-10-CM | POA: Diagnosis present

## 2023-03-11 DIAGNOSIS — F3131 Bipolar disorder, current episode depressed, mild: Secondary | ICD-10-CM | POA: Diagnosis not present

## 2023-03-11 DIAGNOSIS — Z720 Tobacco use: Secondary | ICD-10-CM | POA: Diagnosis not present

## 2023-03-11 DIAGNOSIS — M1990 Unspecified osteoarthritis, unspecified site: Secondary | ICD-10-CM

## 2023-03-11 DIAGNOSIS — Z4682 Encounter for fitting and adjustment of non-vascular catheter: Secondary | ICD-10-CM | POA: Diagnosis not present

## 2023-03-11 DIAGNOSIS — R140 Abdominal distension (gaseous): Secondary | ICD-10-CM | POA: Diagnosis not present

## 2023-03-11 DIAGNOSIS — K56609 Unspecified intestinal obstruction, unspecified as to partial versus complete obstruction: Secondary | ICD-10-CM

## 2023-03-11 DIAGNOSIS — R188 Other ascites: Secondary | ICD-10-CM | POA: Diagnosis not present

## 2023-03-11 DIAGNOSIS — E876 Hypokalemia: Secondary | ICD-10-CM | POA: Insufficient documentation

## 2023-03-11 DIAGNOSIS — Z433 Encounter for attention to colostomy: Secondary | ICD-10-CM | POA: Diagnosis not present

## 2023-03-11 MED ORDER — DIPHENHYDRAMINE HCL 25 MG PO CAPS: 25.0000 mg | ORAL_CAPSULE | Freq: Four times a day (QID) | ORAL | Status: DC | PRN

## 2023-03-11 MED ORDER — HYDRALAZINE HCL 20 MG/ML IJ SOLN: 10.0000 mg | INTRAMUSCULAR | Status: DC | PRN

## 2023-03-11 MED ORDER — MELATONIN 3 MG PO TABS: 3.0000 mg | ORAL_TABLET | Freq: Every evening | ORAL | Status: DC | PRN

## 2023-03-11 MED ORDER — ONDANSETRON 4 MG PO TBDP: 4.0000 mg | ORAL_TABLET | Freq: Four times a day (QID) | ORAL | Status: DC | PRN

## 2023-03-11 MED ORDER — PROCHLORPERAZINE MALEATE 10 MG PO TABS: 10.0000 mg | ORAL_TABLET | Freq: Four times a day (QID) | ORAL | Status: DC | PRN

## 2023-03-11 MED ORDER — MENTHOL 3 MG MT LOZG
1.0000 | LOZENGE | OROMUCOSAL | Status: DC | PRN
  Administered 2023-03-11 – 2023-03-13 (×3): 3 mg via ORAL
  Filled 2023-03-11 (×3): qty 9

## 2023-03-11 MED ORDER — HYDROMORPHONE HCL 1 MG/ML IJ SOLN
0.5000 mg | INTRAMUSCULAR | Status: DC | PRN
  Administered 2023-03-11 – 2023-03-17 (×24): 0.5 mg via INTRAVENOUS
  Filled 2023-03-11 (×26): qty 0.5

## 2023-03-11 MED ORDER — ONDANSETRON HCL 4 MG/2ML IJ SOLN
4.0000 mg | Freq: Four times a day (QID) | INTRAMUSCULAR | Status: DC | PRN
  Administered 2023-03-11: 4 mg via INTRAVENOUS
  Filled 2023-03-11: qty 2

## 2023-03-11 MED ORDER — ENOXAPARIN SODIUM 40 MG/0.4ML IJ SOSY
40.0000 mg | PREFILLED_SYRINGE | INTRAMUSCULAR | Status: DC
  Administered 2023-03-11 – 2023-03-16 (×6): 40 mg via SUBCUTANEOUS
  Filled 2023-03-11 (×6): qty 0.4

## 2023-03-11 MED ORDER — METHOCARBAMOL 1000 MG/10ML IJ SOLN
500.0000 mg | Freq: Three times a day (TID) | INTRAVENOUS | Status: DC | PRN
  Administered 2023-03-11 – 2023-03-12 (×2): 500 mg via INTRAVENOUS
  Filled 2023-03-11: qty 500
  Filled 2023-03-11: qty 5
  Filled 2023-03-11: qty 500

## 2023-03-11 MED ORDER — DIPHENHYDRAMINE HCL 50 MG/ML IJ SOLN: 25.0000 mg | Freq: Four times a day (QID) | INTRAMUSCULAR | Status: DC | PRN

## 2023-03-11 MED ORDER — METOPROLOL TARTRATE 5 MG/5ML IV SOLN: 5.0000 mg | Freq: Four times a day (QID) | INTRAVENOUS | Status: DC | PRN

## 2023-03-11 MED ORDER — SODIUM CHLORIDE 0.9 % IV SOLN: INTRAVENOUS | Status: DC

## 2023-03-11 MED ORDER — METHOCARBAMOL 500 MG PO TABS: 500.0000 mg | ORAL_TABLET | Freq: Three times a day (TID) | ORAL | Status: DC | PRN

## 2023-03-11 MED ORDER — PROCHLORPERAZINE EDISYLATE 10 MG/2ML IJ SOLN: 5.0000 mg | Freq: Four times a day (QID) | INTRAMUSCULAR | Status: DC | PRN

## 2023-03-11 NOTE — Progress Notes (Signed)
   Subjective/Chief Complaint: She had worsening reflux and subsequent vomiting that started last night.  Presented to [facility] and had a CT scan concerning for ileus versus obstruction.  Transferred here and more comfortable, states she started to pass Flatus This Afternoon.   Objective: Vital signs in last 24 hours: Temp:  [98.5 F (36.9 C)-98.6 F (37 C)] 98.6 F (37 C) (07/06 1821) Pulse Rate:  [71-73] 73 (07/06 1821) Resp:  [18] 18 (07/06 1821) BP: (115-130)/(63-64) 115/64 (07/06 1821) SpO2:  [97 %] 97 % (07/06 1821) Weight:  [76.1 kg] 76.1 kg (07/06 1300) Last BM Date : 03/10/23  Intake/Output from previous day: No intake/output data recorded. Intake/Output this shift: No intake/output data recorded.  Alert well-appearing Unlabored respirations Abdomen is soft, mildly distended, appropriately tender.  Incisions are all clean and dry and intact without any cellulitis.  NG tube output is bilious.  Lab Results:  No results for input(s): "WBC", "HGB", "HCT", "PLT" in the last 72 hours. BMET No results for input(s): "NA", "K", "CL", "CO2", "GLUCOSE", "BUN", "CREATININE", "CALCIUM" in the last 72 hours. PT/INR No results for input(s): "LABPROT", "INR" in the last 72 hours. ABG No results for input(s): "PHART", "HCO3" in the last 72 hours.  Invalid input(s): "PCO2", "PO2"  Studies/Results: DG Abd Portable 1V  Result Date: 03/11/2023 CLINICAL DATA:  NG tube placement EXAM: PORTABLE ABDOMEN - 1 VIEW COMPARISON:  None Available. FINDINGS: Tip of NG tube is seen in the stomach in the medial aspect of the fundus. There are dilated small bowel loops in left upper abdomen. Transverse diameter of heart is increased. There are no signs of pulmonary edema or focal pulmonary consolidation. IMPRESSION: To both NG tube is seen in the stomach. There is dilation of small bowel loops suggesting ileus or partial obstruction. Electronically Signed   By: Ernie Avena M.D.   On:  03/11/2023 17:32    Anti-infectives: Anti-infectives (From admission, onward)    None       Assessment/Plan: Ileus versus partial small bowel obstruction status post robotic LAR, takedown of loop colostomy, small bowel resection, appendectomy, and 120-minute lysis of adhesions on 03/01/2023 by Dr. Michaell Cowing. Will plan to continue NG decompression overnight and reassuring that she is starting to have some flatus.  Abdominal exam is benign.  Continue IV fluids, plain film in the morning.   LOS: 0 days    Berna Bue 03/11/2023

## 2023-03-12 ENCOUNTER — Inpatient Hospital Stay (HOSPITAL_COMMUNITY): Payer: 59

## 2023-03-12 LAB — MAGNESIUM: Magnesium: 2.2 mg/dL (ref 1.7–2.4)

## 2023-03-12 LAB — CBC
HCT: 34.8 % — ABNORMAL LOW (ref 36.0–46.0)
Hemoglobin: 10.9 g/dL — ABNORMAL LOW (ref 12.0–15.0)
MCH: 30.4 pg (ref 26.0–34.0)
MCHC: 31.3 g/dL (ref 30.0–36.0)
MCV: 96.9 fL (ref 80.0–100.0)
Platelets: 359 10*3/uL (ref 150–400)
RBC: 3.59 MIL/uL — ABNORMAL LOW (ref 3.87–5.11)
RDW: 13.9 % (ref 11.5–15.5)
WBC: 8.8 10*3/uL (ref 4.0–10.5)
nRBC: 0 % (ref 0.0–0.2)

## 2023-03-12 LAB — BASIC METABOLIC PANEL
Anion gap: 6 (ref 5–15)
BUN: 12 mg/dL (ref 8–23)
CO2: 30 mmol/L (ref 22–32)
Calcium: 8.6 mg/dL — ABNORMAL LOW (ref 8.9–10.3)
Chloride: 105 mmol/L (ref 98–111)
Creatinine, Ser: 0.61 mg/dL (ref 0.44–1.00)
GFR, Estimated: >60 mL/min (ref >60)
Glucose, Bld: 79 mg/dL (ref 70–99)
Potassium: 3.6 mmol/L (ref 3.5–5.1)
Sodium: 141 mmol/L (ref 135–145)

## 2023-03-12 LAB — PHOSPHORUS: Phosphorus: 3.2 mg/dL (ref 2.5–4.6)

## 2023-03-12 MED ORDER — CARMEX CLASSIC LIP BALM EX OINT
TOPICAL_OINTMENT | CUTANEOUS | Status: DC | PRN
  Filled 2023-03-12: qty 10

## 2023-03-12 NOTE — Progress Notes (Signed)
      Chief Complaint/Subjective: Not nauseated currently, +flatus overnight  Objective: Vital signs in last 24 hours: Temp:  [97.6 F (36.4 C)-99 F (37.2 C)] 97.6 F (36.4 C) (07/07 0501) Pulse Rate:  [65-73] 65 (07/07 0501) Resp:  [18] 18 (07/07 0501) BP: (114-130)/(58-64) 117/58 (07/07 0501) SpO2:  [93 %-97 %] 93 % (07/07 0501) Weight:  [76.1 kg] 76.1 kg (07/06 1300) Last BM Date : 03/10/23 Intake/Output from previous day: 07/06 0701 - 07/07 0700 In: 1609.1 [I.V.:1553.9; IV Piggyback:55.2] Out: 1100 [Urine:1100]  PE: Gen: NAD Resp: nonlabored Card: RRR Abd: soft, nondistended, NG with bilious output  Lab Results:  Recent Labs    03/12/23 0452  WBC 8.8  HGB 10.9*  HCT 34.8*  PLT 359   Recent Labs    03/12/23 0452  NA 141  K 3.6  CL 105  CO2 30  GLUCOSE 79  BUN 12  CREATININE 0.61  CALCIUM 8.6*   No results for input(s): "LABPROT", "INR" in the last 72 hours.    Component Value Date/Time   NA 141 03/12/2023 0452   K 3.6 03/12/2023 0452   CL 105 03/12/2023 0452   CO2 30 03/12/2023 0452   GLUCOSE 79 03/12/2023 0452   BUN 12 03/12/2023 0452   CREATININE 0.61 03/12/2023 0452   CALCIUM 8.6 (L) 03/12/2023 0452   PROT 5.7 (L) 12/20/2021 0341   ALBUMIN 1.6 (L) 12/20/2021 0341   AST 21 12/20/2021 0341   ALT 20 12/20/2021 0341   ALKPHOS 65 12/20/2021 0341   BILITOT 0.4 12/20/2021 0341   GFRNONAA >60 03/12/2023 0452   GFRAA  02/27/2010 2026    >60        The eGFR has been calculated using the MDRD equation. This calculation has not been validated in all clinical situations. eGFR's persistently <60 mL/min signify possible Chronic Kidney Disease.    Assessment/Plan Ileus versus partial small bowel obstruction status post robotic LAR, takedown of loop colostomy, small bowel resection, appendectomy, and 120-minute lysis of adhesions on 03/01/2023 by Dr. Michaell Cowing.  -improving but still with moderate bilious output and dilation on XR -continue NG  tube   LOS: 1 day   I reviewed last 24 h vitals and pain scores, last 48 h intake and output, last 24 h labs and trends, and last 24 h imaging results.  This care required moderate level of medical decision making.   [facility] 03/12/2023, 8:07 AM Please see Amion for pager number during day hours 7:00am-4:30pm or 7:00am -11:30am on weekends

## 2023-03-12 NOTE — Progress Notes (Addendum)
Pt requesting IV Dilaudid bc of "butt pain." When asked pt to describe where she hurts, stated 'my butt hurts where I'm trying to get a bedsore . . " Pt stated that she has been laying in the bed watching tv and her butt hurts. Noted buttocks and perirectal area reddened, enc pt to be up and walking/sitting in the recliner to alleviate this pain.

## 2023-03-13 ENCOUNTER — Encounter (HOSPITAL_COMMUNITY): Payer: Self-pay | Admitting: Surgery

## 2023-03-13 ENCOUNTER — Inpatient Hospital Stay (HOSPITAL_COMMUNITY): Payer: 59

## 2023-03-13 DIAGNOSIS — M1990 Unspecified osteoarthritis, unspecified site: Secondary | ICD-10-CM

## 2023-03-13 MED ORDER — LACTATED RINGERS IV BOLUS: 1000.0000 mL | Freq: Three times a day (TID) | INTRAVENOUS | Status: AC | PRN

## 2023-03-13 MED ORDER — ALUM & MAG HYDROXIDE-SIMETH 200-200-20 MG/5ML PO SUSP: 30.0000 mL | Freq: Four times a day (QID) | ORAL | Status: DC | PRN

## 2023-03-13 MED ORDER — PHENOL 1.4 % MT LIQD: 2.0000 | OROMUCOSAL | Status: DC | PRN

## 2023-03-13 MED ORDER — MAGIC MOUTHWASH: 15.0000 mL | Freq: Four times a day (QID) | ORAL | Status: DC | PRN

## 2023-03-13 MED ORDER — METHOCARBAMOL 1000 MG/10ML IJ SOLN
500.0000 mg | Freq: Three times a day (TID) | INTRAVENOUS | Status: DC
  Administered 2023-03-13 – 2023-03-17 (×13): 500 mg via INTRAVENOUS
  Filled 2023-03-13 (×2): qty 500
  Filled 2023-03-13: qty 5
  Filled 2023-03-13: qty 500
  Filled 2023-03-13: qty 5
  Filled 2023-03-13 (×7): qty 500
  Filled 2023-03-13: qty 5
  Filled 2023-03-13 (×3): qty 500

## 2023-03-13 MED ORDER — KETOROLAC TROMETHAMINE 15 MG/ML IJ SOLN
15.0000 mg | Freq: Three times a day (TID) | INTRAMUSCULAR | Status: AC
  Administered 2023-03-13 – 2023-03-15 (×9): 15 mg via INTRAVENOUS
  Filled 2023-03-13 (×9): qty 1

## 2023-03-13 MED ORDER — LORAZEPAM 2 MG/ML IJ SOLN: 0.5000 mg | Freq: Three times a day (TID) | INTRAMUSCULAR | Status: DC | PRN

## 2023-03-13 MED ORDER — SIMETHICONE 40 MG/0.6ML PO SUSP: 80.0000 mg | Freq: Four times a day (QID) | ORAL | Status: DC | PRN

## 2023-03-13 MED ORDER — DIATRIZOATE MEGLUMINE & SODIUM 66-10 % PO SOLN
90.0000 mL | Freq: Once | ORAL | Status: AC
  Administered 2023-03-13: 90 mL via NASOGASTRIC
  Filled 2023-03-13: qty 90

## 2023-03-13 MED ORDER — LACTATED RINGERS IV SOLN: INTRAVENOUS | Status: DC

## 2023-03-13 NOTE — Progress Notes (Signed)
   03/13/23 1008  TOC Brief Assessment  Insurance and Status Reviewed  Patient has primary care physician Yes  Home environment has been reviewed home alone  Prior level of function: independent  Prior/Current Home Services No current home services  Social Determinants of Health Reivew SDOH reviewed no interventions necessary  Readmission risk has been reviewed Yes  Transition of care needs no transition of care needs at this time

## 2023-03-13 NOTE — Progress Notes (Signed)
03/13/2023  Judith Miller [id number] 1960/12/23  CARE TEAM: PCP: [facility]  Outpatient Care Team: Patient Care Team: [facility] as PCP - General Fritzi Mandes, MD as Consulting Physician (General Surgery) Lanelle Bal, DO as Consulting Physician (Gastroenterology) Karie Soda, MD as Consulting Physician (Colon and Rectal Surgery)  Inpatient Treatment Team: Treatment Team: Attending Provider: Karie Soda, MD; Case Manager: Redmond Baseman, RN; Registered Nurse: Wilford Corner, RN; Licensed Practical Nurse: Mertie Clause, LPN; Pharmacist: Norva Pavlov, [facility]; Utilization Review: Clifton James, RN; Technician: Christell Constant, NT   Problem List:   Principal Problem:   Ileus (HCC)   03/01/2023  POST-OPERATIVE DIAGNOSIS:   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  Very dense adhesions of small bowel to the retroperitoneum especially in the pelvis circumferentially.  Couple areas causing partial obstruction with stricturing especially at the distal ileum and mid ileum.  Ultimately had to resect section of small bowel with extremely dense stricture in enterotomy and thin serosal areas due to extremely dense adhesions.   Rectosigmoid resection done dense fibrotic inflammation with chronic white pigtail percutaneous drain resting in the left anterolateral pelvis near the proximal  rectum and stricturing.  Phlegmon and inflammation but no active abscess.  No obvious metastatic disease on visceral parietal peritoneum or liver.   It is a 29mm EEA anastomosis ( distal descending colon  connected to mid rectum.)  It rests 9 cm from the anal verge by rigid proctoscopy.   CASE DATA: Type of patient?: Elective [facility] Private Case Status of Case? Elective Scheduled Infection Present At Time Of Surgery (PATOS)?  PHLEGMON  PATHOLOGY  FINAL MICROSCOPIC DIAGNOSIS:  A. APPENDIX: - Benign appendix  B. LOOP, TRANSVERSE COLOSTOMY: - Portion of colon with benign skin, consistent with colostomy site - No malignancy identified  C. ILEUM, RESECTION: - Portion of ileum with serosal adhesions - Two benign lymph nodes - No malignancy identified  D. RECTOSIGMOID COLON, RESECTION: - Portion of colon with diverticulosis and serosal adhesions - Margins are viable - Two benign lymph nodes - No malignancy identified  E. FINAL DISTAL MARGIN, EXCISION: - Benign colonic mucosa with mild hyperplastic change - No malignancy identified  Assessment [facility] Stay = 2 days)      Postop ileus most likely related to severe adhesions and need for narcotic pain medication   Plan:  Nasogastric tube decompression.  IV fluids.  Follow and correct electrolytes.  No evidence of any renal failure.  No evidence of any wound infection abscess leak or perforation.  No need for antibiotics.  Pain control.  Placed on standing ketorolac and methocarbamol to minimize narcotics.  Ice and heat.  Anxiety and bipolar disorder.  Holding psychiatric medications for now.  Lorazepam as needed.  Controlled nausea aggressively.  Follow  -VTE prophylaxis- SCDs, enoxaparin etc  -mobilize as tolerated to help recovery  -Disposition:  Disposition:  The patient is from: Home Anticipate discharge to:  Home Anticipated Date of Discharge is:  July 11,2024  Barriers to discharge:  Pending Clinical  improvement (more likely than not)  Patient currently is NOT MEDICALLY STABLE for discharge from the hospital from a surgery standpoint.      I reviewed nursing notes, last 24 h vitals and pain scores, last 48 h intake and output, last 24 h labs and trends, and last 24 h imaging results.  I have reviewed this patient's available data, including medical history, events of note, test results, etc as part of my evaluation.   A significant portion of that time was spent in counseling. Care during the described time interval was provided by me.  This care required moderate level of medical decision making.  03/13/2023    Subjective: (Chief complaint)  Less crampy pain.  Passed a little bit of flatus.  Disappointed that this happened.  Trying to read litigated reimagine what triggered this.  Feels a little bit of cramping with IV narcotics but notes overall the pain control is good.  Starting to feel hungry and not nauseated.  Afraid to get up and walk much.  Objective:  Vital signs:  Vitals:   03/12/23 0501 03/12/23 1317 03/12/23 2059 03/13/23 0529  BP: (!) 117/58 138/66 138/60 (!) 157/68  Pulse: 65 69 74 73  Resp: 18 16 18 18   Temp: 97.6 F (36.4 C) 97.8 F (36.6 C) 99 F (37.2 C) 97.8 F (36.6 C)  TempSrc: Oral Oral Oral Oral  SpO2: 93% 93% 94% 95%  Weight:      Height:        Last BM Date : 03/11/23  Intake/Output   Yesterday:  07/07 0701 - 07/08 0700 In: 2907 [I.V.:2852; IV Piggyback:55] Out: 2550 [Urine:1500; Emesis/NG output:1050] This shift:  No intake/output data recorded.  Bowel function:  Flatus: YES  BM:  No  NGT:  Thick bilious effluent.  Filter dirty.  I removed.   Physical Exam:  General: Pt awake/alert in no acute distress Eyes: PERRL, normal EOM.  Sclera clear.  No icterus Neuro: CN II-XII intact w/o focal sensory/motor deficits. Lymph: No head/neck/groin lymphadenopathy Psych:  No delerium/psychosis/paranoia.  Remains somewhat  anxious but mostly consolable.  Again interrupts often but is mostly able to be redirectable and mostly reassured.  Oriented x 4 HENT: Normocephalic, Mucus membranes moist.  No thrush Neck: Supple, No tracheal deviation.  No obvious thyromegaly Chest: No pain to chest wall compression.  Good respiratory excursion.  No audible wheezing CV:  Pulses intact.  Regular rhythm.  No major extremity edema MS: Normal AROM mjr joints.  No obvious deformity  Abdomen: Soft.  Nondistended.  Mildly tender at incisions only.  Incisions clean dry intact.  Old colostomy site with normal healing ridge.  No evidence of any cellulitis abscess or drainage.  No evidence of peritonitis.  No incarcerated hernias.  Ext:   No deformity.  No mjr edema.  No cyanosis Skin: No petechiae / purpurea.  No major sores.  Warm and dry    Results:   SURGICAL PATHOLOGY CASE: [id number] PATIENT: Nyra Capes Surgical Pathology Report     Clinical History: sigmoid stricture with chronic fistula due to diverticulitis     FINAL MICROSCOPIC DIAGNOSIS:  A. APPENDIX: - Benign appendix  B. LOOP, TRANSVERSE COLOSTOMY: - Portion of colon with benign skin, consistent with colostomy site - No malignancy identified  C. ILEUM, RESECTION: - Portion of ileum with serosal adhesions - Two benign lymph nodes - No malignancy identified  D. RECTOSIGMOID COLON, RESECTION: - Portion of colon with diverticulosis and  serosal adhesions - Margins are viable - Two benign lymph nodes - No malignancy identified  E. FINAL DISTAL MARGIN, EXCISION: - Benign colonic mucosa with mild hyperplastic change - No malignancy identified  Shinichi Anguiano DESCRIPTION:  A.  Specimen: Received fresh is an appendix with an open end margin that is over inked black. Size: 6.0 cm in length by 0.7 cm in diameter Serosa: Pink-tan, smooth, and intact Mucosa: Pink-tan and glistening without distinct lesions Wall: The wall has a thickness of 0.3  cm Lumen: Pinpoint, containing a minimal amount of watery fecal material. No fecaliths are grossly identified. Block Summary: The appendix margin, a representative cross-section, and a perpendicular section of the appendiceal tip are submitted in 1 block.  B.  Received fresh is "loop/transverse colostomy".  The specimen consists of a 3.5 cm in length by 2.1 cm in diameter portion of bowel with a thin rim of tan, wrinkled skin at 1 end.  The mucosa is tan and glistening without distinct lesions.  The serosa is minimally roughened. The wall has a thickness of 0.7 cm.  A representative section including a portion of the skin is submitted in 1 block.  C.  Received fresh is "ileum" the specimen consists of a 33 cm in length by up to 1.6 cm in diameter portion of bowel.  The serosa is tan with minimal amounts of gray adhesion.  The bowel contains a minimal amount of digestive material.  The mucosa is tan and glistening without distinct lesions and a wall thickness of 0.5 cm.  The attached fat is palpated and 2 lymph node candidates are identified and sampled measuring 0.1 and 0.9 cm. Representative sections are submitted as follows: C1-C2 margins from opposite sides C3-C4 central bowel with serosal adhesions C5 two lymph node candidates, whole  D. Specimen: Received fresh is "rectosigmoid colon open end proximal" Length: 13.5 cm in length by 2.0 cm in diameter Serosa: Pink-tan and roughened with multiple gray adhesions Contents: Ample amounts of fecal material Mucosa/Wall: The mucosa is tan and glistening with a wall thickness of 0.7 cm.  There are multiple diverticula ranging from 0.2 to 1.5 cm.  No further distinct lesions are grossly identified Lymph nodes: The pericolic fat is palpated revealing 2 lymph node candidates measuring 0.5 and 0.6 cm that are sampled. Block Summary: D1 proximal margin D2 distal margin D3 Central bowel with adhesion D4 diverticulum D5 two lymph node  candidates, whole  E. Received fresh is "final distal margin" consisting of a 2.5 cm in diameter by 0.5 cm in length ring of tan, glistening mucosa without distinct lesions.  The final margin is submitted in 1 block. (KW, 03/02/2023)   Final Diagnosis performed by Clifton James, MD.   Electronically signed 03/06/2023 Technical and / or Professional components performed at [facility], [address], [place], [place] [address].  Immunohistochemistry Technical component (if applicable) was performed at [facility]. [address], [place], [place] [address].   IMMUNOHISTOCHEMISTRY DISCLAIMER (if applicable): Some of these immunohistochemical stains may have been developed and the performance characteristics determine by [facility]. Some may not have been cleared or approved by the U.S. Food and Drug Administration. The FDA has determined that such clearance or approval is not necessary. This test is used for clinical purposes. It should not be regarded as investigational or for research. This laboratory is certified under the Clinical Laboratory Improvement Amendments of 1988 (CLIA-88) as qualified to perform high complexity clinical laboratory testing.  The controls stained appropriately.   IHC stains are performed on formalin fixed, paraffin embedded tissue using a 3,3"diaminobenzidine (DAB) chromogen and Leica Bond Autostainer System. The staining intensity of the nucleus is score manually and is reported as the percentage of tumor cell nuclei demonstrating specific nuclear staining. The specimens are fixed in 10% Neutral Formalin for at least 6 hours and up to 72hrs. These tests are validated on decalcified tissue. Results should be interpreted with caution given the possibility of false negative results on decalcified specimens. Antibody Clones are as follows ER-clone [id number], PR-clone 16, Ki67- clone MM1. Some of  these immunohistochemical stains may have been developed and the performance characteristics determined by [facility].   Cultures: No results found for this or any previous visit (from the past 720 hour(s)).  Labs: Results for orders placed or performed during the hospital encounter of 03/11/23 (from the past 48 hour(s))  Basic metabolic panel     Status: Abnormal   Collection Time: 03/12/23  4:52 AM  Result Value Ref Range   Sodium 141 135 - 145 mmol/L   Potassium 3.6 3.5 - 5.1 mmol/L   Chloride 105 98 - 111 mmol/L   CO2 30 22 - 32 mmol/L   Glucose, Bld 79 70 - 99 mg/dL    Comment: Glucose reference range applies only to samples taken after fasting for at least 8 hours.   BUN 12 8 - 23 mg/dL   Creatinine, Ser 6.21 0.44 - 1.00 mg/dL   Calcium 8.6 (L) 8.9 - 10.3 mg/dL   GFR, Estimated >30 >86 mL/min    Comment: (NOTE) Calculated using the CKD-EPI Creatinine Equation (2021)    Anion gap 6 5 - 15    Comment: Performed at [facility], [address], [place], [place] [address]  Magnesium     Status: None   Collection Time: 03/12/23  4:52 AM  Result Value Ref Range   Magnesium 2.2 1.7 - 2.4 mg/dL    Comment: Performed at [facility], [address], [place], [place] [address]  CBC     Status: Abnormal   Collection Time: 03/12/23  4:52 AM  Result Value Ref Range   WBC 8.8 4.0 - 10.5 K/uL   RBC 3.59 (L) 3.87 - 5.11 MIL/uL   Hemoglobin 10.9 (L) 12.0 - 15.0 g/dL   HCT 28.4 (L) 13.2 - 44.0 %   MCV 96.9 80.0 - 100.0 fL   MCH 30.4 26.0 - 34.0 pg   MCHC 31.3 30.0 - 36.0 g/dL   RDW 10.2 72.5 - 36.6 %   Platelets 359 150 - 400 K/uL   nRBC 0.0 0.0 - 0.2 %    Comment: Performed at [facility], [address], [place], [place] [address]  Phosphorus     Status: None   Collection Time: 03/12/23  4:52 AM  Result Value Ref Range   Phosphorus 3.2 2.5 - 4.6 mg/dL    Comment: Performed at [facility],  [address], [place], [place] [address]    Imaging / Studies: DG Abd Portable 1V  Result Date: 03/13/2023 CLINICAL DATA:  Ileus EXAM: PORTABLE ABDOMEN - 1 VIEW COMPARISON:  None Available. FINDINGS: NG tube in stomach. Persistent dilated loops of small bowel LEFT abdomen measure up to 4.9 cm compared to 5.4 cm. There is gas in the rectum. No intraperitoneal free air on supine exam. IMPRESSION: 1. Persistent dilated loops of small bowel in the LEFT abdomen. 2. Gas  in the rectum. 3. NG tube in stomach. Electronically Signed   By: Genevive Bi M.D.   On: 03/13/2023 08:11   DG Abd Portable 1V  Result Date: 03/12/2023 CLINICAL DATA:  Evaluate small bowel obstruction. EXAM: PORTABLE ABDOMEN - 1 VIEW COMPARISON:  03/11/23 FINDINGS: NG tube is identified within the left upper quadrant of the abdomen with tip and side port below the GE junction within the stomach. Persistent dilated small bowel loops within the left upper quadrant and left lower quadrant of the abdomen measuring up to 5 cm in diameter. Paucity the of colonic gas. IMPRESSION: 1. Persistent abnormally dilated loops of small bowel in the left hemiabdomen compatible with either small bowel ileus or obstruction. Electronically Signed   By: Signa Kell M.D.   On: 03/12/2023 10:19   DG Abd Portable 1V  Result Date: 03/11/2023 CLINICAL DATA:  NG tube placement EXAM: PORTABLE ABDOMEN - 1 VIEW COMPARISON:  None Available. FINDINGS: Tip of NG tube is seen in the stomach in the medial aspect of the fundus. There are dilated small bowel loops in left upper abdomen. Transverse diameter of heart is increased. There are no signs of pulmonary edema or focal pulmonary consolidation. IMPRESSION: To both NG tube is seen in the stomach. There is dilation of small bowel loops suggesting ileus or partial obstruction. Electronically Signed   By: Ernie Avena M.D.   On: 03/11/2023 17:32    Medications / Allergies: per  chart  Antibiotics: Anti-infectives (From admission, onward)    None         Note: Portions of this report may have been transcribed using voice recognition software. Every effort was made to ensure accuracy; however, inadvertent computerized transcription errors may be present.   Any transcriptional errors that result from this process are unintentional.    Ardeth Sportsman, MD, FACS, MASCRS Esophageal, Gastrointestinal & Colorectal Surgery Robotic and Minimally Invasive Surgery  [facility] [address] [place], [place] [address] [phone] Fax [phone] Main  CONTACT INFORMATION:  Weekday (9AM-5PM): Call [facility] main office at [phone]  Weeknight (5PM-9AM) or Weekend/Holiday: Check [email or web address] (password " [email or web address]") for General Surgery [facility] coverage  (Please, do not use SecureChat as it is not reliable communication to reach operating surgeons for immediate patient care given surgeries/outpatient duties/clinic/cross-coverage/off post-call which would lead to a delay in care.  Epic staff messaging available for outptient concerns, but may not be answered for 48 hours or more).     03/13/2023  8:26 AM

## 2023-03-14 ENCOUNTER — Inpatient Hospital Stay (HOSPITAL_COMMUNITY): Payer: 59

## 2023-03-14 DIAGNOSIS — E876 Hypokalemia: Secondary | ICD-10-CM | POA: Insufficient documentation

## 2023-03-14 DIAGNOSIS — F419 Anxiety disorder, unspecified: Secondary | ICD-10-CM | POA: Insufficient documentation

## 2023-03-14 LAB — MAGNESIUM: Magnesium: 2.1 mg/dL (ref 1.7–2.4)

## 2023-03-14 LAB — CREATININE, SERUM
Creatinine, Ser: 0.56 mg/dL (ref 0.44–1.00)
GFR, Estimated: >60 mL/min (ref >60)

## 2023-03-14 LAB — POTASSIUM: Potassium: 3.4 mmol/L — ABNORMAL LOW (ref 3.5–5.1)

## 2023-03-14 MED ORDER — LORATADINE 10 MG PO TABS
10.0000 mg | ORAL_TABLET | Freq: Every evening | ORAL | Status: DC
  Administered 2023-03-16: 10 mg via ORAL
  Filled 2023-03-14 (×2): qty 1

## 2023-03-14 MED ORDER — LEVOCETIRIZINE DIHYDROCHLORIDE 5 MG PO TABS: 5.0000 mg | ORAL_TABLET | Freq: Every evening | ORAL | Status: DC

## 2023-03-14 MED ORDER — POTASSIUM CHLORIDE 10 MEQ/100ML IV SOLN
10.0000 meq | INTRAVENOUS | Status: AC
  Administered 2023-03-14 (×4): 10 meq via INTRAVENOUS
  Filled 2023-03-14 (×3): qty 100

## 2023-03-14 NOTE — Progress Notes (Addendum)
03/14/2023  Judith Miller [id number] March 26, 1961  CARE TEAM: PCP: [facility]  Outpatient Care Team: Patient Care Team: [facility] as PCP - General Fritzi Mandes, MD as Consulting Physician (General Surgery) Lanelle Bal, DO as Consulting Physician (Gastroenterology) Karie Soda, MD as Consulting Physician (Colon and Rectal Surgery)  Inpatient Treatment Team: Treatment Team: Attending Provider: Karie Soda, MD; Charge Nurse: Amil Amen, RN; Pharmacist: Norva Pavlov, [facility]; Utilization Review: Clifton James, RN   Problem List:   Principal Problem:   Ileus [facility]) Active Problems:   Bipolar affective disorder, currently depressed, mild (HCC)   Tobacco abuse   Arthritis   03/01/2023  POST-OPERATIVE DIAGNOSIS:   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  Very dense adhesions of small bowel to the retroperitoneum especially in the pelvis circumferentially.  Couple areas causing partial obstruction with stricturing especially at the distal ileum and mid ileum.  Ultimately had to resect section of small bowel with extremely dense stricture in enterotomy and thin serosal areas due to extremely dense adhesions.   Rectosigmoid resection done dense fibrotic inflammation with chronic white pigtail percutaneous drain resting in the left anterolateral pelvis near the proximal rectum and  stricturing.  Phlegmon and inflammation but no active abscess.  No obvious metastatic disease on visceral parietal peritoneum or liver.   It is a 29mm EEA anastomosis ( distal descending colon  connected to mid rectum.)  It rests 9 cm from the anal verge by rigid proctoscopy.   CASE DATA: Type of patient?: Elective [facility] Private Case Status of Case? Elective Scheduled Infection Present At Time Of Surgery (PATOS)?  PHLEGMON  PATHOLOGY  FINAL MICROSCOPIC DIAGNOSIS:  A. APPENDIX: - Benign appendix  B. LOOP, TRANSVERSE COLOSTOMY: - Portion of colon with benign skin, consistent with colostomy site - No malignancy identified  C. ILEUM, RESECTION: - Portion of ileum with serosal adhesions - Two benign lymph nodes - No malignancy identified  D. RECTOSIGMOID COLON, RESECTION: - Portion of colon with diverticulosis and serosal adhesions - Margins are viable - Two benign lymph nodes - No malignancy identified  E. FINAL DISTAL MARGIN, EXCISION: - Benign colonic mucosa with mild hyperplastic change - No malignancy identified  Assessment [facility] Stay = 3 days)      Postop ileus most likely related to severe adhesions and need for narcotic pain medication   Plan:  Nasogastric tube clamping trial with liquids.  Check residuals every 6 hours.  If low residuals and no recurrent nausea vomiting, pull NG tube later today versus in the morning.  IV fluids.  Follow and correct electrolytes.  Hypokalemia.  IV replacement for now.  Check magnesium.  No evidence of any renal failure.  No evidence of any wound infection abscess leak or perforation.  No need for antibiotics.  Pain control.  Placed on standing ketorolac and methocarbamol to minimize narcotics.  Ice and heat.  Anxiety-lorazepam as needed.  Bipolar disorder.  Stable for now.  Monitor.  -VTE prophylaxis- SCDs, enoxaparin etc  -mobilize as  tolerated to help recovery  -Disposition:  Disposition:  The patient is from:  Home Anticipate discharge to:  Home Anticipated Date of Discharge is:  July 11,2024   Barriers to discharge:  Pending Clinical improvement (more likely than not)  Patient currently is NOT MEDICALLY STABLE for discharge from the hospital from a surgery standpoint.      I reviewed nursing notes, last 24 h vitals and pain scores, last 48 h intake and output, last 24 h labs and trends, and last 24 h imaging results.  I have reviewed this patient's available data, including medical history, events of note, test results, etc as part of my evaluation.   A significant portion of that time was spent in counseling. Care during the described time interval was provided by me.  This care required moderate level of medical decision making.  03/14/2023    Subjective: (Chief complaint)  Patient with more gas and loose bowel movements.  NG tube output still moderately high but sipping liquids.  Denies much in the way of abdominal crampy pain.  Hungry  Objective:  Vital signs:  Vitals:   03/13/23 0529 03/13/23 1324 03/13/23 2020 03/14/23 0458  BP: (!) 157/68 (!) 128/57 125/71 (!) 128/56  Pulse: 73 72 78 62  Resp: 18 14 18 18   Temp: 97.8 F (36.6 C) 98.3 F (36.8 C) 98.5 F (36.9 C) 97.6 F (36.4 C)  TempSrc: Oral Oral Oral Oral  SpO2: 95% 93% 95% 96%  Weight:      Height:        Last BM Date : 03/11/23  Intake/Output   Yesterday:  07/08 0701 - 07/09 0700 In: 1735 [P.O.:1; I.V.:1520; IV Piggyback:214] Out: 3790 [Urine:600; Emesis/NG output:3190] This shift:  No intake/output data recorded.  Bowel function:  Flatus: YES  BM:  YES  NGT:  Thin bilious effluent.  Flushes well.  Filter clean.   Physical Exam:  General: Pt awake/alert in no acute distress Eyes: PERRL, normal EOM.  Sclera clear.  No icterus Neuro: CN II-XII intact w/o focal sensory/motor deficits. Lymph: No head/neck/groin lymphadenopathy Psych:  No delerium/psychosis/paranoia.  Remains somewhat anxious  but mostly consolable.  Again interrupts often but is mostly able to be redirectable and mostly reassured.  Oriented x 4 HENT: Normocephalic, Mucus membranes moist.  No thrush Neck: Supple, No tracheal deviation.  No obvious thyromegaly Chest: No pain to chest wall compression.  Good respiratory excursion.  No audible wheezing CV:  Pulses intact.  Regular rhythm.  No major extremity edema MS: Normal AROM mjr joints.  No obvious deformity  Abdomen: Soft.  Nondistended.  Nontender.  Incisions clean dry intact.  Old colostomy site with normal healing ridge.  No evidence of any cellulitis abscess or drainage.  No evidence of peritonitis.  No incarcerated hernias.  Ext:   No deformity.  No mjr edema.  No cyanosis Skin: No petechiae / purpurea.  No major sores.  Warm and dry    Results:   SURGICAL PATHOLOGY  CASE: [id number] PATIENT: Judith Miller Surgical Pathology Report  Clinical History: sigmoid stricture with chronic fistula due to diverticulitis  FINAL MICROSCOPIC DIAGNOSIS:  A. APPENDIX: - Benign appendix  B. LOOP, TRANSVERSE COLOSTOMY: - Portion of colon with benign skin, consistent with colostomy site - No malignancy identified  C. ILEUM, RESECTION: - Portion of ileum with serosal adhesions - Two benign lymph nodes - No malignancy identified  D. RECTOSIGMOID COLON, RESECTION: - Portion of colon with diverticulosis and serosal adhesions - Margins are viable -  Two benign lymph nodes - No malignancy identified  E. FINAL DISTAL MARGIN, EXCISION: - Benign colonic mucosa with mild hyperplastic change - No malignancy identified  Toya Palacios DESCRIPTION:  A.  Specimen: Received fresh is an appendix with an open end margin that is over inked black. Size: 6.0 cm in length by 0.7 cm in diameter Serosa: Pink-tan, smooth, and intact Mucosa: Pink-tan and glistening without distinct lesions Wall: The wall has a thickness of 0.3 cm Lumen: Pinpoint, containing a minimal  amount of watery fecal material. No fecaliths are grossly identified. Block Summary: The appendix margin, a representative cross-section, and a perpendicular section of the appendiceal tip are submitted in 1 block.  B.  Received fresh is "loop/transverse colostomy".  The specimen consists of a 3.5 cm in length by 2.1 cm in diameter portion of bowel with a thin rim of tan, wrinkled skin at 1 end.  The mucosa is tan and glistening without distinct lesions.  The serosa is minimally roughened. The wall has a thickness of 0.7 cm.  A representative section including a portion of the skin is submitted in 1 block.  C.  Received fresh is "ileum" the specimen consists of a 33 cm in length by up to 1.6 cm in diameter portion of bowel.  The serosa is tan with minimal amounts of gray adhesion.  The bowel contains a minimal amount of digestive material.  The mucosa is tan and glistening without distinct lesions and a wall thickness of 0.5 cm.  The attached fat is palpated and 2 lymph node candidates are identified and sampled measuring 0.1 and 0.9 cm. Representative sections are submitted as follows: C1-C2 margins from opposite sides C3-C4 central bowel with serosal adhesions C5 two lymph node candidates, whole  D. Specimen: Received fresh is "rectosigmoid colon open end proximal" Length: 13.5 cm in length by 2.0 cm in diameter Serosa: Pink-tan and roughened with multiple gray adhesions Contents: Ample amounts of fecal material Mucosa/Wall: The mucosa is tan and glistening with a wall thickness of 0.7 cm.  There are multiple diverticula ranging from 0.2 to 1.5 cm.  No further distinct lesions are grossly identified Lymph nodes: The pericolic fat is palpated revealing 2 lymph node candidates measuring 0.5 and 0.6 cm that are sampled. Block Summary: D1 proximal margin D2 distal margin D3 Central bowel with adhesion D4 diverticulum D5 two lymph node candidates, whole  E. Received fresh is  "final distal margin" consisting of a 2.5 cm in diameter by 0.5 cm in length ring of tan, glistening mucosa without distinct lesions.  The final margin is submitted in 1 block. (KW, 03/02/2023)   Final Diagnosis performed by Clifton James, MD.   Electronically signed 03/06/2023 Technical and / or Professional components performed at [facility], [address] [place], [place] [address].  Immunohistochemistry Technical component (if applicable) was performed at [facility]. [address], [address], [place], [place] [address].   IMMUNOHISTOCHEMISTRY DISCLAIMER (if applicable): Some of these immunohistochemical stains may have been developed and the performance characteristics determine by [facility]. Some may not have been cleared or approved by the U.S. Food and Drug Administration. The FDA has determined that such clearance or approval is not necessary. This test is used for clinical purposes. It should not be regarded as investigational or for research. This laboratory is certified under the Clinical Laboratory Improvement Amendments of 1988 (CLIA-88) as qualified to perform high complexity clinical laboratory testing.  The controls stained appropriately.   IHC  stains are performed on formalin fixed, paraffin embedded tissue using a 3,3"diaminobenzidine (DAB) chromogen and Leica Bond Autostainer System. The staining intensity of the nucleus is score manually and is reported as the percentage of tumor cell nuclei demonstrating specific nuclear staining. The specimens are fixed in 10% Neutral Formalin for at least 6 hours and up to 72hrs. These tests are validated on decalcified tissue. Results should be interpreted with caution given the possibility of false negative results on decalcified specimens. Antibody Clones are as follows ER-clone 9F, PR-clone 16, Ki67- clone MM1. Some of these immunohistochemical stains may have been  developed and the performance characteristics determined by [facility].   Cultures: No results found for this or any previous visit (from the past 720 hour(s)).  Labs: Results for orders placed or performed during the hospital encounter of 03/11/23 (from the past 48 hour(s))  Potassium     Status: Abnormal   Collection Time: 03/14/23  5:05 AM  Result Value Ref Range   Potassium 3.4 (L) 3.5 - 5.1 mmol/L    Comment: Performed at [facility], [address], [place], [place] [address]  Creatinine, serum     Status: None   Collection Time: 03/14/23  5:05 AM  Result Value Ref Range   Creatinine, Ser 0.56 0.44 - 1.00 mg/dL   GFR, Estimated >18 >84 mL/min    Comment: (NOTE) Calculated using the CKD-EPI Creatinine Equation (2021) Performed at [facility], [address], [place], [place] [address]     Imaging / Studies: DG Abd Portable 1V-Small Bowel Obstruction Protocol-initial, 8 hr delay  Result Date: 03/14/2023 CLINICAL DATA:  Small-bowel protocol, 8 hours post contrast. EXAM: PORTABLE ABDOMEN - 1 VIEW COMPARISON:  Abdominal x-ray 03/13/2023 FINDINGS: Contrast is seen within the right colon and rectum, new from prior. Dilated small bowel loops persist measuring up to 4.7 cm. Enteric tube tip is in the gastric body, unchanged. IMPRESSION: 1. Contrast is seen within the right colon and rectum. 2. Dilated small bowel loops persist measuring up to 4.7 cm. Findings are favored as ileus or partial small bowel obstruction. Electronically Signed   By: Darliss Cheney M.D.   On: 03/14/2023 02:06   DG Abd Portable 1V  Result Date: 03/13/2023 CLINICAL DATA:  Ileus EXAM: PORTABLE ABDOMEN - 1 VIEW COMPARISON:  None Available. FINDINGS: NG tube in stomach. Persistent dilated loops of small bowel LEFT abdomen measure up to 4.9 cm compared to 5.4 cm. There is gas in the rectum. No intraperitoneal free air on supine exam. IMPRESSION: 1. Persistent dilated  loops of small bowel in the LEFT abdomen. 2. Gas in the rectum. 3. NG tube in stomach. Electronically Signed   By: Genevive Bi M.D.   On: 03/13/2023 08:11    Medications / Allergies: per chart  Antibiotics: Anti-infectives (From admission, onward)    None         Note: Portions of this report may have been transcribed using voice recognition software. Every effort was made to ensure accuracy; however, inadvertent computerized transcription errors may be present.   Any transcriptional errors that result from this process are unintentional.    Ardeth Sportsman, MD, FACS, MASCRS Esophageal, Gastrointestinal & Colorectal Surgery Robotic and Minimally Invasive Surgery  [facility] [address] [place], [place] [address] [phone] Fax [phone] Main  CONTACT INFORMATION:  Weekday (9AM-5PM): Call [facility] main office at [phone]  Weeknight (5PM-9AM) or Weekend/Holiday: Check  [email or web address] (password " [email or web address]") for General Surgery [facility] coverage  (Please, do not use SecureChat as it is not reliable communication to reach operating surgeons for immediate patient care given surgeries/outpatient duties/clinic/cross-coverage/off post-call which would lead to a delay in care.  Epic staff messaging available for outptient concerns, but may not be answered for 48 hours or more).     03/14/2023  8:03 AM

## 2023-03-14 NOTE — H&P (Signed)
See progress note from 7/6

## 2023-03-14 NOTE — Progress Notes (Signed)
Pt complained of nausea or heartburn, checked residual in NG, over 300, so hooked NG back to LWS. 1000cc out.

## 2023-03-15 LAB — PHOSPHORUS: Phosphorus: 2.7 mg/dL (ref 2.5–4.6)

## 2023-03-15 LAB — CREATININE, SERUM
Creatinine, Ser: 0.5 mg/dL (ref 0.44–1.00)
GFR, Estimated: >60 mL/min (ref >60)

## 2023-03-15 LAB — POTASSIUM: Potassium: 3.4 mmol/L — ABNORMAL LOW (ref 3.5–5.1)

## 2023-03-15 MED ORDER — POTASSIUM CHLORIDE 10 MEQ/100ML IV SOLN
10.0000 meq | INTRAVENOUS | Status: AC
  Administered 2023-03-15 (×6): 10 meq via INTRAVENOUS
  Filled 2023-03-15 (×3): qty 100

## 2023-03-15 MED ORDER — BISACODYL 10 MG RE SUPP
10.0000 mg | Freq: Every day | RECTAL | Status: DC
  Administered 2023-03-15 – 2023-03-16 (×2): 10 mg via RECTAL
  Filled 2023-03-15 (×2): qty 1

## 2023-03-15 MED ORDER — METOCLOPRAMIDE HCL 5 MG/ML IJ SOLN
5.0000 mg | Freq: Four times a day (QID) | INTRAMUSCULAR | Status: DC
  Administered 2023-03-15 – 2023-03-17 (×10): 5 mg via INTRAVENOUS
  Filled 2023-03-15 (×10): qty 2

## 2023-03-15 MED ORDER — LACTATED RINGERS IV BOLUS: 1000.0000 mL | Freq: Three times a day (TID) | INTRAVENOUS | Status: AC | PRN

## 2023-03-15 MED ORDER — PSYLLIUM 95 % PO PACK
1.0000 | PACK | Freq: Two times a day (BID) | ORAL | Status: DC
  Administered 2023-03-15 – 2023-03-17 (×4): 1 via ORAL
  Filled 2023-03-15 (×4): qty 1

## 2023-03-15 MED ORDER — POTASSIUM CHLORIDE CRYS ER 20 MEQ PO TBCR
40.0000 meq | EXTENDED_RELEASE_TABLET | Freq: Two times a day (BID) | ORAL | Status: DC
  Administered 2023-03-15 – 2023-03-17 (×4): 40 meq via ORAL
  Filled 2023-03-15 (×4): qty 2

## 2023-03-15 NOTE — Progress Notes (Signed)
03/15/2023  Judith Miller [id number] 1961-02-16  CARE TEAM: PCP: [facility]  Outpatient Care Team: Patient Care Team: [facility] as PCP - General Fritzi Mandes, MD as Consulting Physician (General Surgery) Lanelle Bal, DO as Consulting Physician (Gastroenterology) Karie Soda, MD as Consulting Physician (Colon and Rectal Surgery)  Inpatient Treatment Team: Treatment Team: Attending Provider: Karie Soda, MD; Registered Nurse: Montel Clock, RN; Charge Nurse: Amil Amen, RN; Pharmacist: Norva Pavlov, [facility]; Technician: Misty Stanley, NT; Utilization Review: Clifton James, RN   Problem List:   Principal Problem:   Ileus 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  Very dense adhesions of small bowel to the retroperitoneum especially in the pelvis circumferentially.  Couple areas causing partial obstruction with stricturing especially at the distal ileum and mid ileum.  Ultimately had to resect section of small bowel with extremely dense stricture in enterotomy and thin serosal areas due to extremely dense adhesions.   Rectosigmoid resection done dense fibrotic  inflammation with chronic white pigtail percutaneous drain resting in the left anterolateral pelvis near the proximal rectum and stricturing.  Phlegmon and inflammation but no active abscess.  No obvious metastatic disease on visceral parietal peritoneum or liver.   It is a 29mm EEA anastomosis ( distal descending colon  connected to mid rectum.)  It rests 9 cm from the anal verge by rigid proctoscopy.   CASE DATA: Type of patient?: Elective [facility] Private Case Status of Case? Elective Scheduled Infection Present At Time Of Surgery (PATOS)?  PHLEGMON  PATHOLOGY  FINAL MICROSCOPIC DIAGNOSIS:  A. APPENDIX: - Benign appendix  B. LOOP, TRANSVERSE COLOSTOMY: - Portion of colon with benign skin, consistent with colostomy site - No malignancy identified  C. ILEUM, RESECTION: - Portion of ileum with serosal adhesions - Two benign lymph nodes - No malignancy identified  D. RECTOSIGMOID COLON, RESECTION: - Portion of colon with diverticulosis and serosal adhesions - Margins are viable - Two benign lymph nodes - No malignancy identified  E. FINAL DISTAL MARGIN, EXCISION: - Benign colonic mucosa with mild hyperplastic change - No malignancy identified  Assessment [facility] Stay = 4 days)      Postop ileus most likely related to severe adhesions and need for narcotic pain medication   Plan:  Retry Nasogastric tube clamping trial with liquids.  Check residuals every 6 hours.   Add Reglan  IV fluids.  Follow and correct electrolytes.  Hypokalemia.  Repeat IV replacement for now.  Magnesium OK, check phos  No evidence of any renal failure.  No evidence of any wound infection abscess leak or perforation.  No need for antibiotics.  Pain control.  Placed on standing ketorolac and methocarbamol to minimize narcotics.  Ice and heat.  Anxiety-lorazepam as needed.  Bipolar disorder.  Stable  for now.  Monitor.  -VTE prophylaxis- SCDs, enoxaparin etc  -mobilize as tolerated to help  recovery  -Disposition:  Disposition:  The patient is from: Home Anticipate discharge to:  Home Anticipated Date of Discharge is:  July 13,2024   Barriers to discharge:  Pending Clinical improvement (more likely than not)  Patient currently is NOT MEDICALLY STABLE for discharge from the hospital from a surgery standpoint.      I reviewed nursing notes, last 24 h vitals and pain scores, last 48 h intake and output, last 24 h labs and trends, and last 24 h imaging results.  I have reviewed this patient's available data, including medical history, events of note, test results, etc as part of my evaluation.   A significant portion of that time was spent in counseling. Care during the described time interval was provided by me.  This care required moderate level of medical decision making.  03/15/2023    Subjective: (Chief complaint)  Patient with bloating after drinking a lot of liquids.  Had high BG residual.  Feeling better now.  Passing gas.    Walking much more.    Denies abdominal pain.  Objective:  Vital signs:  Vitals:   03/14/23 0458 03/14/23 1257 03/14/23 2026 03/15/23 0515  BP: (!) 128/56 125/65 135/63 137/66  Pulse: 62 63 70 (!) 55  Resp: 18 16 18 18   Temp: 97.6 F (36.4 C) 97.7 F (36.5 C) 98.1 F (36.7 C) (!) 97.4 F (36.3 C)  TempSrc: Oral Oral Oral Oral  SpO2: 96% 93% 95% 94%  Weight:      Height:        Last BM Date : 03/14/23  Intake/Output   Yesterday:  07/09 0701 - 07/10 0700 In: 2333.1 [P.O.:840; I.V.:1222.2; IV Piggyback:270.9] Out: 1700 [Emesis/NG output:1700] This shift:  No intake/output data recorded.  Bowel function:  Flatus: YES  BM:  YES  NGT:  Thin bilious effluent.  Flushes well.  Filter clean.   Physical Exam:  General: Pt awake/alert in no acute distress Eyes: PERRL, normal EOM.  Sclera clear.  No icterus Neuro: CN II-XII intact w/o focal sensory/motor deficits. Lymph: No head/neck/groin lymphadenopathy Psych:   No delerium/psychosis/paranoia.  Remains somewhat anxious but mostly consolable.  Again interrupts often but is mostly able to be redirectable and mostly reassured.  Oriented x 4 HENT: Normocephalic, Mucus membranes moist.  No thrush Neck: Supple, No tracheal deviation.  No obvious thyromegaly Chest: No pain to chest wall compression.  Good respiratory excursion.  No audible wheezing CV:  Pulses intact.  Regular rhythm.  No major extremity edema MS: Normal AROM mjr joints.  No obvious deformity  Abdomen: Soft.  Mildy distended.  Nontender.  Incisions clean dry intact.  Old colostomy site with normal healing ridge.  No evidence of any cellulitis abscess or drainage.  No evidence of peritonitis.  No incarcerated hernias.  Ext:   No deformity.  No mjr edema.  No cyanosis Skin: No petechiae / purpurea.  No major sores.  Warm and dry    Results:   SURGICAL PATHOLOGY  CASE: [id number] PATIENT: Judith Miller Surgical Pathology Report  Clinical History: sigmoid stricture with chronic fistula due to diverticulitis  FINAL MICROSCOPIC DIAGNOSIS:  A. APPENDIX: - Benign appendix  B. LOOP, TRANSVERSE COLOSTOMY: - Portion of colon with benign skin, consistent with colostomy site - No malignancy identified  C. ILEUM, RESECTION: - Portion of ileum with serosal adhesions - Two benign lymph nodes - No malignancy identified  D.  RECTOSIGMOID COLON, RESECTION: - Portion of colon with diverticulosis and serosal adhesions - Margins are viable - Two benign lymph nodes - No malignancy identified  E. FINAL DISTAL MARGIN, EXCISION: - Benign colonic mucosa with mild hyperplastic change - No malignancy identified  Marcayla Budge DESCRIPTION:  A.  Specimen: Received fresh is an appendix with an open end margin that is over inked black. Size: 6.0 cm in length by 0.7 cm in diameter Serosa: Pink-tan, smooth, and intact Mucosa: Pink-tan and glistening without distinct lesions Wall: The wall has a  thickness of 0.3 cm Lumen: Pinpoint, containing a minimal amount of watery fecal material. No fecaliths are grossly identified. Block Summary: The appendix margin, a representative cross-section, and a perpendicular section of the appendiceal tip are submitted in 1 block.  B.  Received fresh is "loop/transverse colostomy".  The specimen consists of a 3.5 cm in length by 2.1 cm in diameter portion of bowel with a thin rim of tan, wrinkled skin at 1 end.  The mucosa is tan and glistening without distinct lesions.  The serosa is minimally roughened. The wall has a thickness of 0.7 cm.  A representative section including a portion of the skin is submitted in 1 block.  C.  Received fresh is "ileum" the specimen consists of a 33 cm in length by up to 1.6 cm in diameter portion of bowel.  The serosa is tan with minimal amounts of gray adhesion.  The bowel contains a minimal amount of digestive material.  The mucosa is tan and glistening without distinct lesions and a wall thickness of 0.5 cm.  The attached fat is palpated and 2 lymph node candidates are identified and sampled measuring 0.1 and 0.9 cm. Representative sections are submitted as follows: C1-C2 margins from opposite sides C3-C4 central bowel with serosal adhesions C5 two lymph node candidates, whole  D. Specimen: Received fresh is "rectosigmoid colon open end proximal" Length: 13.5 cm in length by 2.0 cm in diameter Serosa: Pink-tan and roughened with multiple gray adhesions Contents: Ample amounts of fecal material Mucosa/Wall: The mucosa is tan and glistening with a wall thickness of 0.7 cm.  There are multiple diverticula ranging from 0.2 to 1.5 cm.  No further distinct lesions are grossly identified Lymph nodes: The pericolic fat is palpated revealing 2 lymph node candidates measuring 0.5 and 0.6 cm that are sampled. Block Summary: D1 proximal margin D2 distal margin D3 Central bowel with adhesion D4 diverticulum D5  two lymph node candidates, whole  E. Received fresh is "final distal margin" consisting of a 2.5 cm in diameter by 0.5 cm in length ring of tan, glistening mucosa without distinct lesions.  The final margin is submitted in 1 block. (KW, 03/02/2023)   Final Diagnosis performed by Clifton James, MD.   Electronically signed 03/06/2023 Technical and / or Professional components performed at [facility], [address] [place], [place] [address].  Immunohistochemistry Technical component (if applicable) was performed at [facility]. [address], [place], [place] [address].   IMMUNOHISTOCHEMISTRY DISCLAIMER (if applicable): Some of these immunohistochemical stains may have been developed and the performance characteristics determine by [facility]. Some may not have been cleared or approved by the U.S. Food and Drug Administration. The FDA has determined that such clearance or approval is not necessary. This test is used for clinical purposes. It should not be regarded as investigational or for research. This laboratory is certified under the Clinical Laboratory Improvement Amendments of 1988 (CLIA-88)  as qualified to perform high complexity clinical laboratory testing.  The controls stained appropriately.   IHC stains are performed on formalin fixed, paraffin embedded tissue using a 3,3"diaminobenzidine (DAB) chromogen and Leica Bond Autostainer System. The staining intensity of the nucleus is score manually and is reported as the percentage of tumor cell nuclei demonstrating specific nuclear staining. The specimens are fixed in 10% Neutral Formalin for at least 6 hours and up to 72hrs. These tests are validated on decalcified tissue. Results should be interpreted with caution given the possibility of false negative results on decalcified specimens. Antibody Clones are as follows ER-clone 6F, PR-clone 16, Ki67- clone MM1. Some of  these immunohistochemical stains may have been developed and the performance characteristics determined by [facility].   Cultures: No results found for this or any previous visit (from the past 720 hour(s)).  Labs: Results for orders placed or performed during the hospital encounter of 03/11/23 (from the past 48 hour(s))  Potassium     Status: Abnormal   Collection Time: 03/14/23  5:05 AM  Result Value Ref Range   Potassium 3.4 (L) 3.5 - 5.1 mmol/L    Comment: Performed at [facility], [address], [place], [place] [address]  Creatinine, serum     Status: None   Collection Time: 03/14/23  5:05 AM  Result Value Ref Range   Creatinine, Ser 0.56 0.44 - 1.00 mg/dL   GFR, Estimated >60 >45 mL/min    Comment: (NOTE) Calculated using the CKD-EPI Creatinine Equation (2021) Performed at [facility], [address], [place], [place] [address]   Magnesium     Status: None   Collection Time: 03/14/23  5:05 AM  Result Value Ref Range   Magnesium 2.1 1.7 - 2.4 mg/dL    Comment: Performed at [facility], [address], [place], [place] [address]  Potassium     Status: Abnormal   Collection Time: 03/15/23  5:08 AM  Result Value Ref Range   Potassium 3.4 (L) 3.5 - 5.1 mmol/L    Comment: Performed at [facility], [address], [place], [place] [address]  Creatinine, serum     Status: None   Collection Time: 03/15/23  5:08 AM  Result Value Ref Range   Creatinine, Ser 0.50 0.44 - 1.00 mg/dL   GFR, Estimated >21 >30 mL/min    Comment: (NOTE) Calculated using the CKD-EPI Creatinine Equation (2021) Performed at [facility], [address], [place], [place] [address]     Imaging / Studies: DG Abd Portable 1V-Small Bowel Obstruction Protocol-initial, 8 hr delay  Result Date: 03/14/2023 CLINICAL DATA:  Small-bowel protocol, 8 hours post contrast. EXAM: PORTABLE ABDOMEN - 1 VIEW  COMPARISON:  Abdominal x-ray 03/13/2023 FINDINGS: Contrast is seen within the right colon and rectum, new from prior. Dilated small bowel loops persist measuring up to 4.7 cm. Enteric tube tip is in the gastric body, unchanged. IMPRESSION: 1. Contrast is seen within the right colon and rectum. 2. Dilated small bowel loops persist measuring up to 4.7 cm. Findings are favored as ileus or partial small bowel obstruction. Electronically Signed   By: Darliss Cheney M.D.   On: 03/14/2023 02:06    Medications / Allergies: per chart  Antibiotics: Anti-infectives (From admission, onward)    None         Note: Portions of this report may have been transcribed using voice recognition software. Every effort was made to ensure accuracy; however, inadvertent  computerized transcription errors may be present.   Any transcriptional errors that result from this process are unintentional.    Ardeth Sportsman, MD, FACS, MASCRS Esophageal, Gastrointestinal & Colorectal Surgery Robotic and Minimally Invasive Surgery  [facility] [address], [address] [place], [place] [address] [phone] Fax [phone] Main  CONTACT INFORMATION:  Weekday (9AM-5PM): Call [facility] main office at [phone]  Weeknight (5PM-9AM) or Weekend/Holiday: Check [email or web address] (password " [email or web address]") for General Surgery [facility] coverage  (Please, do not use SecureChat as it is not reliable communication to reach operating surgeons for immediate patient care given surgeries/outpatient duties/clinic/cross-coverage/off post-call which would lead to a delay in care.  Epic staff messaging available for outptient concerns, but may not be answered for 48 hours or more).     03/15/2023  7:38 AM

## 2023-03-16 LAB — CBC
HCT: 35.6 % — ABNORMAL LOW (ref 36.0–46.0)
Hemoglobin: 11.2 g/dL — ABNORMAL LOW (ref 12.0–15.0)
MCH: 29.6 pg (ref 26.0–34.0)
MCHC: 31.5 g/dL (ref 30.0–36.0)
MCV: 93.9 fL (ref 80.0–100.0)
Platelets: 342 10*3/uL (ref 150–400)
RBC: 3.79 MIL/uL — ABNORMAL LOW (ref 3.87–5.11)
RDW: 13.2 % (ref 11.5–15.5)
WBC: 7.1 10*3/uL (ref 4.0–10.5)
nRBC: 0 % (ref 0.0–0.2)

## 2023-03-16 LAB — BASIC METABOLIC PANEL
Anion gap: 9 (ref 5–15)
BUN: <5 mg/dL — ABNORMAL LOW (ref 8–23)
CO2: 27 mmol/L (ref 22–32)
Calcium: 8.9 mg/dL (ref 8.9–10.3)
Chloride: 98 mmol/L (ref 98–111)
Creatinine, Ser: 0.46 mg/dL (ref 0.44–1.00)
GFR, Estimated: >60 mL/min (ref >60)
Glucose, Bld: 71 mg/dL (ref 70–99)
Potassium: 3.8 mmol/L (ref 3.5–5.1)
Sodium: 134 mmol/L — ABNORMAL LOW (ref 135–145)

## 2023-03-16 MED ORDER — SODIUM CHLORIDE 0.9% FLUSH
3.0000 mL | Freq: Two times a day (BID) | INTRAVENOUS | Status: DC
  Administered 2023-03-16 – 2023-03-17 (×3): 3 mL via INTRAVENOUS

## 2023-03-16 MED ORDER — SODIUM CHLORIDE 0.9 % IV SOLN: 250.0000 mL | INTRAVENOUS | Status: DC | PRN

## 2023-03-16 MED ORDER — SODIUM CHLORIDE 0.9% FLUSH: 3.0000 mL | INTRAVENOUS | Status: DC | PRN

## 2023-03-16 MED ORDER — LACTATED RINGERS IV BOLUS: 1000.0000 mL | Freq: Three times a day (TID) | INTRAVENOUS | Status: DC | PRN

## 2023-03-16 NOTE — Progress Notes (Signed)
03/16/2023  Judith Miller [id number] 1961-04-05  CARE TEAM: PCP: Practice, [facility]  Outpatient Care Team: Patient Care Team: [facility], [facility] as PCP - General Fritzi Mandes, MD as Consulting Physician (General Surgery) Lanelle Bal, DO as Consulting Physician (Gastroenterology) Karie Soda, MD as Consulting Physician (Colon and Rectal Surgery)  Inpatient Treatment Team: Treatment Team:  Karie Soda, MD Idelia Salm, RN   Problem List:   Principal Problem:   Ileus [facility]) Active Problems:   Bipolar affective disorder, currently depressed, mild (HCC)   Bipolar disorder (HCC)   Tobacco abuse   Arthritis   Hypokalemia   Anxiety   03/01/2023  POST-OPERATIVE DIAGNOSIS:   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  Very dense adhesions of small bowel to the retroperitoneum especially in the pelvis circumferentially.  Couple areas causing partial obstruction with stricturing especially at the distal ileum and mid ileum.  Ultimately had to resect section of small bowel with extremely dense stricture in enterotomy and thin serosal areas due to extremely dense adhesions.   Rectosigmoid resection done dense fibrotic inflammation with chronic white pigtail percutaneous drain resting in the left anterolateral pelvis near the proximal rectum and stricturing.  Phlegmon and inflammation but no active  abscess.  No obvious metastatic disease on visceral parietal peritoneum or liver.   It is a 29mm EEA anastomosis ( distal descending colon  connected to mid rectum.)  It rests 9 cm from the anal verge by rigid proctoscopy.   CASE DATA: Type of patient?: Elective [facility] Private Case Status of Case? Elective Scheduled Infection Present At Time Of Surgery (PATOS)?  PHLEGMON  PATHOLOGY  FINAL MICROSCOPIC DIAGNOSIS:  A. APPENDIX: - Benign appendix  B. LOOP, TRANSVERSE COLOSTOMY: - Portion of colon with benign skin, consistent with colostomy site - No malignancy identified  C. ILEUM, RESECTION: - Portion of ileum with serosal adhesions - Two benign lymph nodes - No malignancy identified  D. RECTOSIGMOID COLON, RESECTION: - Portion of colon with diverticulosis and serosal adhesions - Margins are viable - Two benign lymph nodes - No malignancy identified  E. FINAL DISTAL MARGIN, EXCISION: - Benign colonic mucosa with mild hyperplastic change - No malignancy identified  Assessment Judith Miller Stay = 5 days)      Postop ileus most likely related to severe adhesions and need for narcotic pain medication Resolving  Plan:  Remove the nasogastric tube.  [name] to dysphagia 1 diet.  Possible soft foods.  Stop IV fluids with as needed bolus backup   Follow and correct electrolytes.  Hypokalemia -more stable.  Continue supplementation orally  Repeat IV replacement for now.  Magnesium OK, check phos  No evidence of any renal failure.  No evidence of any wound infection abscess leak or perforation.  No need for antibiotics.  Pain control.  Placed on standing ketorolac and methocarbamol to minimize narcotics.  Ice and heat.  Anxiety-lorazepam as needed.  Bipolar disorder.  Stable for now.  Monitor.  -VTE prophylaxis- SCDs, enoxaparin etc  -mobilize as tolerated to  help recovery  -Disposition:  Disposition:  The patient is from: Home Anticipate discharge to:   Home Anticipated Date of Discharge is:  July 13,2024   Barriers to discharge:  Pending Clinical improvement (more likely than not)  Patient currently is NOT MEDICALLY STABLE for discharge from the hospital from a surgery standpoint.      I reviewed nursing notes, last 24 h vitals and pain scores, last 48 h intake and output, last 24 h labs and trends, and last 24 h imaging results.  I have reviewed this patient's available data, including medical history, events of note, test results, etc as part of my evaluation.   A significant portion of that time was spent in counseling. Care during the described time interval was provided by me.  This care required moderate level of medical decision making.  03/16/2023    Subjective: (Chief complaint)  Patient with no nausea or vomiting.  Tolerated G-tube clamping all day without any need for recurrent suction or high residuals.  Numerous loose bowel movements.  Nursing in room.  NG tube bothering her nose more.  Walking better.  Denies any abdominal pain.  Objective:  Vital signs:  Vitals:   03/15/23 0515 03/15/23 1335 03/15/23 2010 03/16/23 0507  BP: 137/66 (!) 112/57 (!) 141/78 133/70  Pulse: (!) 55 61 65 (!) 59  Resp: 18 16 18 18   Temp: (!) 97.4 F (36.3 C) 98 F (36.7 C) 98 F (36.7 C) (!) 97.5 F (36.4 C)  TempSrc: Oral Oral Oral Oral  SpO2: 94% 95% 95% 96%  Weight:      Height:        Last BM Date : 03/15/23  Intake/Output   Yesterday:  07/10 0701 - 07/11 0700 In: 3568.7 [P.O.:1240; I.V.:1590.8; IV Piggyback:737.9] Out: 1375 [Urine:250; Emesis/NG output:125; Stool:1000] This shift:  No intake/output data recorded.  Bowel function:  Flatus: YES  BM:  YES  NGT:  Thin bilious effluent - scant.  Flushes well.  Filter clean.   Physical Exam:  General: Pt awake/alert in no acute distress Eyes: PERRL, normal EOM.  Sclera clear.  No icterus Neuro: CN II-XII intact w/o focal sensory/motor  deficits. Lymph: No head/neck/groin lymphadenopathy Psych:  No delerium/psychosis/paranoia.  Remains somewhat anxious but mostly consolable.  Again interrupts often but is mostly able to be redirectable and mostly reassured.  Oriented x 4 HENT: Normocephalic, Mucus membranes moist.  No thrush Neck: Supple, No tracheal deviation.  No obvious thyromegaly Chest: No pain to chest wall compression.  Good respiratory excursion.  No audible wheezing CV:  Pulses intact.  Regular rhythm.  No major extremity edema MS: Normal AROM mjr joints.  No obvious deformity  Abdomen: Soft.  Mildy distended.  Nontender.  Incisions clean dry intact.  Old colostomy site with normal healing ridge.  No evidence of any cellulitis abscess or drainage.  No evidence of peritonitis.  No incarcerated hernias.  Ext:   No deformity.  No mjr edema.  No cyanosis Skin: No petechiae / purpurea.  No major sores.  Warm and dry    Results:   SURGICAL PATHOLOGY  CASE: [phone] PATIENT: Judith Miller Surgical Pathology Report  Clinical History: sigmoid stricture with chronic fistula due to diverticulitis  FINAL MICROSCOPIC DIAGNOSIS:  A. APPENDIX: - Benign appendix  B. LOOP, TRANSVERSE COLOSTOMY: - Portion of colon with benign skin, consistent with colostomy site - No malignancy identified  C. ILEUM, RESECTION: - Portion of ileum with serosal adhesions - Two benign lymph nodes - No  malignancy identified  D. RECTOSIGMOID COLON, RESECTION: - Portion of colon with diverticulosis and serosal adhesions - Margins are viable - Two benign lymph nodes - No malignancy identified  E. FINAL DISTAL MARGIN, EXCISION: - Benign colonic mucosa with mild hyperplastic change - No malignancy identified  Leni Pankonin DESCRIPTION:  A.  Specimen: Received fresh is an appendix with an open end margin that is over inked black. Size: 6.0 cm in length by 0.7 cm in diameter Serosa: Pink-tan, smooth, and intact Mucosa: Pink-tan  and glistening without distinct lesions Wall: The wall has a thickness of 0.3 cm Lumen: Pinpoint, containing a minimal amount of watery fecal material. No fecaliths are grossly identified. Block Summary: The appendix margin, a representative cross-section, and a perpendicular section of the appendiceal tip are submitted in 1 block.  B.  Received fresh is "loop/transverse colostomy".  The specimen consists of a 3.5 cm in length by 2.1 cm in diameter portion of bowel with a thin rim of tan, wrinkled skin at 1 end.  The mucosa is tan and glistening without distinct lesions.  The serosa is minimally roughened. The wall has a thickness of 0.7 cm.  A representative section including a portion of the skin is submitted in 1 block.  C.  Received fresh is "ileum" the specimen consists of a 33 cm in length by up to 1.6 cm in diameter portion of bowel.  The serosa is tan with minimal amounts of gray adhesion.  The bowel contains a minimal amount of digestive material.  The mucosa is tan and glistening without distinct lesions and a wall thickness of 0.5 cm.  The attached fat is palpated and 2 lymph node candidates are identified and sampled measuring 0.1 and 0.9 cm. Representative sections are submitted as follows: C1-C2 margins from opposite sides C3-C4 central bowel with serosal adhesions C5 two lymph node candidates, whole  D. Specimen: Received fresh is "rectosigmoid colon open end proximal" Length: 13.5 cm in length by 2.0 cm in diameter Serosa: Pink-tan and roughened with multiple gray adhesions Contents: Ample amounts of fecal material Mucosa/Wall: The mucosa is tan and glistening with a wall thickness of 0.7 cm.  There are multiple diverticula ranging from 0.2 to 1.5 cm.  No further distinct lesions are grossly identified Lymph nodes: The pericolic fat is palpated revealing 2 lymph node candidates measuring 0.5 and 0.6 cm that are sampled. Block Summary: D1 proximal margin D2 distal  margin D3 Central bowel with adhesion D4 diverticulum D5 two lymph node candidates, whole  E. Received fresh is "final distal margin" consisting of a 2.5 cm in diameter by 0.5 cm in length ring of tan, glistening mucosa without distinct lesions.  The final margin is submitted in 1 block. (KW, 03/02/2023)   Final Diagnosis performed by Clifton James, MD.   Electronically signed 03/06/2023 Technical and / or Professional components performed at [facility], [address], [place], [place] [address].  Immunohistochemistry Technical component (if applicable) was performed at [facility]. [address], [place], [place] [address].   IMMUNOHISTOCHEMISTRY DISCLAIMER (if applicable): Some of these immunohistochemical stains may have been developed and the performance characteristics determine by [facility]. Some may not have been cleared or approved by the U.S. Food and Drug Administration. The FDA has determined that such clearance or approval is not necessary. This test is used for clinical purposes. It should not be regarded as investigational or for research. This laboratory is certified under the Clinical Laboratory Improvement  Amendments of 1988 (CLIA-88) as qualified to perform high complexity clinical laboratory testing.  The controls stained appropriately.   IHC stains are performed on formalin fixed, paraffin embedded tissue using a 3,3"diaminobenzidine (DAB) chromogen and Leica Bond Autostainer System. The staining intensity of the nucleus is score manually and is reported as the percentage of tumor cell nuclei demonstrating specific nuclear staining. The specimens are fixed in 10% Neutral Formalin for at least 6 hours and up to 72hrs. These tests are validated on decalcified tissue. Results should be interpreted with caution given the possibility of false negative results on decalcified specimens. Antibody Clones are  as follows ER-clone [id number], PR-clone 16, Ki67- clone MM1. Some of these immunohistochemical stains may have been developed and the performance characteristics determined by [facility].   Cultures: No results found for this or any previous visit (from the past 720 hour(s)).  Labs: Results for orders placed or performed during the hospital encounter of 03/11/23 (from the past 48 hour(s))  Phosphorus     Status: None   Collection Time: 03/15/23  5:07 AM  Result Value Ref Range   Phosphorus 2.7 2.5 - 4.6 mg/dL    Comment: Performed at [facility], [address] [place], [place] [address]  Potassium     Status: Abnormal   Collection Time: 03/15/23  5:08 AM  Result Value Ref Range   Potassium 3.4 (L) 3.5 - 5.1 mmol/L    Comment: Performed at [facility], [address], [place], [place] [address]  Creatinine, serum     Status: None   Collection Time: 03/15/23  5:08 AM  Result Value Ref Range   Creatinine, Ser 0.50 0.44 - 1.00 mg/dL   GFR, Estimated >09 >81 mL/min    Comment: (NOTE) Calculated using the CKD-EPI Creatinine Equation (2021) Performed at [facility], [address], [place], [place] [address]   Basic metabolic panel     Status: Abnormal   Collection Time: 03/16/23  5:02 AM  Result Value Ref Range   Sodium 134 (L) 135 - 145 mmol/L   Potassium 3.8 3.5 - 5.1 mmol/L   Chloride 98 98 - 111 mmol/L   CO2 27 22 - 32 mmol/L   Glucose, Bld 71 70 - 99 mg/dL    Comment: Glucose reference range applies only to samples taken after fasting for at least 8 hours.   BUN <5 (L) 8 - 23 mg/dL   Creatinine, Ser 8.29 0.44 - 1.00 mg/dL   Calcium 8.9 8.9 - 56.2 mg/dL   GFR, Estimated >13 >08 mL/min    Comment: (NOTE) Calculated using the CKD-EPI Creatinine Equation (2021)    Anion gap 9 5 - 15    Comment: Performed at [facility], [address], [place], [place] [address]  CBC     Status: Abnormal    Collection Time: 03/16/23  5:02 AM  Result Value Ref Range   WBC 7.1 4.0 - 10.5 K/uL   RBC 3.79 (L) 3.87 - 5.11 MIL/uL   Hemoglobin 11.2 (L) 12.0 - 15.0 g/dL   HCT 69.6 (L) 29.5 - 28.4 %   MCV 93.9 80.0 - 100.0 fL   MCH 29.6 26.0 - 34.0 pg   MCHC 31.5 30.0 - 36.0 g/dL   RDW 13.2 44.0 - 10.2 %   Platelets 342 150 - 400 K/uL   nRBC 0.0 0.0 - 0.2 %    Comment: Performed at [facility], [address]  [place], [place] [address]    Imaging / Studies: No results found.  Medications / Allergies: per chart  Antibiotics: Anti-infectives (From admission, onward)    None         Note: Portions of this report may have been transcribed using voice recognition software. Every effort was made to ensure accuracy; however, inadvertent computerized transcription errors may be present.   Any transcriptional errors that result from this process are unintentional.    Ardeth Sportsman, MD, FACS, MASCRS Esophageal, Gastrointestinal & Colorectal Surgery Robotic and Minimally Invasive Surgery  [facility] [address] [place], [place] [address] [phone] Fax [phone] Main  CONTACT INFORMATION:  Weekday (9AM-5PM): Call [facility] main office at [phone]  Weeknight (5PM-9AM) or Weekend/Holiday: Check [email or web address] (password " [email or web address]") for General Surgery [facility] coverage  (Please, do not use SecureChat as it is not reliable communication to reach operating surgeons for immediate patient care given surgeries/outpatient duties/clinic/cross-coverage/off post-call which would lead to a delay in care.  Epic staff messaging available for outptient concerns, but may not be answered for 48 hours or more).     03/16/2023  7:25 AM

## 2023-03-17 LAB — BASIC METABOLIC PANEL
Anion gap: 8 (ref 5–15)
BUN: 6 mg/dL — ABNORMAL LOW (ref 8–23)
CO2: 28 mmol/L (ref 22–32)
Calcium: 9.4 mg/dL (ref 8.9–10.3)
Chloride: 101 mmol/L (ref 98–111)
Creatinine, Ser: 0.65 mg/dL (ref 0.44–1.00)
GFR, Estimated: >60 mL/min (ref >60)
Glucose, Bld: 82 mg/dL (ref 70–99)
Potassium: 4 mmol/L (ref 3.5–5.1)
Sodium: 137 mmol/L (ref 135–145)

## 2023-03-17 NOTE — Plan of Care (Signed)
  Problem: Respiratory: Goal: Respiratory status will improve Outcome: Adequate for Discharge   

## 2023-03-17 NOTE — Discharge Instructions (Signed)
EATING AFTER A SMALL BOWEL OBSTRUCTION   EAT START WITH PUREED OR SOFT FOODS Gradually transition to a high fiber diet with a fiber supplement over the next few days after discharge  WALK Walk an hour a day.  Control your pain to do that.    CONTROL PAIN Control pain so that you can walk, sleep, tolerate sneezing/coughing, go up/down stairs.  HAVE A BOWEL MOVEMENT DAILY Keep your bowels regular to avoid problems.  OK to try a laxative to override constipation.  OK to use an antidairrheal to slow down diarrhea.  Call if not better after 2 tries  CALL IF YOU HAVE PROBLEMS/CONCERNS Call if you are still struggling despite following these instructions. Call if you have concerns not answered by these instructions     After your attack of SMALL BOWEL OBSTRUCTION, expect some issues over the next few weeks.    To help you through this temporary phase, we start you out on a pureed (blenderized) diet.  Your first meal in the hospital was thin liquids.  You should have been given a pureed diet by the time you left the hospital.  We ask patients to stay on a pureed diet for the first few days to avoid anything getting "stuck."  Don't be alarmed if your ability to swallow doesn't progress according to this plan.  Everyone is different and some diets can advance more or less quickly.     Some BASIC RULES to follow are: Maintain an upright position whenever eating or drinking. Take small bites - just a teaspoon size bite at a time. Eat slowly.  It may also help to eat only one food at a time. Consider nibbling through smaller, more frequent meals & avoid the urge to eat BIG meals Do not push through feelings of fullness, nausea, or bloatedness Do not mix solid foods and liquids in the same mouthful Try not to "wash foods down" with large gulps of liquids. Avoid carbonated (bubbly/fizzy) drinks.   Avoid foods that make you feel gassy or bloated.  Start with bland foods first.  Wait on trying  greasy, fried, or spicy meals until you are tolerating more bland solids well. Expect to be more gassy/flatulent/bloated initially.  Walking will help your body manage it better. Consider using medications for bloating that contain simethicone such as  Maalox or Gas-X  Eat in a relaxed atmosphere & minimize distractions. Avoid talking while eating.   Do not use straws. Following each meal, sit in an upright position (90 degree angle) for 60 to 90 minutes.  Going for a short walk can help as well If food does stick, don't panic.  Try to relax and let the food pass on its own.  Sipping WARM LIQUID such as strong hot black tea can also help slide it down.   Be gradual in changes & use common sense:  -If you easily tolerating a certain "level" of foods, advance to the next level gradually -If you are having trouble swallowing a particular food, then avoid it.   -If food is sticking when you advance your diet, go back to thinner previous diet (the lower LEVEL) for 1-2 days.  LEVEL 1 = PUREED DIET  Do for the first FEW DAYS AFTER LEAVING THE HOSPITAL  -Foods in this group are pureed or blenderized to a smooth, mashed potato-like consistency.  -If necessary, the pureed foods can keep their shape with the addition of a thickening agent.   -Meat should be pureed to a   smooth, pasty consistency.  Hot broth or gravy may be added to the pureed meat, approximately 1 oz. of liquid per 3 oz. serving of meat. -CAUTION:  If any foods do not puree into a smooth consistency, swallowing will be more difficult.  (For example, nuts or seeds sometimes do not blend well.)  Hot Foods Cold Foods  Pureed scrambled eggs and cheese Pureed cottage cheese  Baby cereals Thickened juices and nectars  Thinned cooked cereals (no lumps) Thickened milk or eggnog  Pureed French toast or pancakes Ensure  Mashed potatoes Ice cream  Pureed parsley, au gratin, scalloped potatoes, candied sweet potatoes Fruit or Italian ice,  sherbet  Pureed buttered or alfredo noodles Plain yogurt  Pureed vegetables (no corn or peas) Instant breakfast  Pureed soups and creamed soups Smooth pudding, mousse, custard  Pureed scalloped apples Whipped gelatin  Gravies Sugar, syrup, honey, jelly  Sauces, cheese, tomato, barbecue, white, creamed Cream  Any baby food Creamer  Alcohol in moderation (not beer or champagne) Margarine  Coffee or tea Mayonnaise   Ketchup, mustard   Apple sauce   SAMPLE MENU:  PUREED DIET Breakfast Lunch Dinner  Orange juice, 1/2 cup Cream of wheat, 1/2 cup Pineapple juice, 1/2 cup Pureed turkey, barley soup, 3/4 cup Pureed Hawaiian chicken, 3 oz  Scrambled eggs, mashed or blended with cheese, 1/2 cup Tea or coffee, 1 cup  Whole milk, 1 cup  Non-dairy creamer, 2 Tbsp. Mashed potatoes, 1/2 cup Pureed cooled broccoli, 1/2 cup Apple sauce, 1/2 cup Coffee or tea Mashed potatoes, 1/2 cup Pureed spinach, 1/2 cup Frozen yogurt, 1/2 cup Tea or coffee      LEVEL 2 = SOFT DIET  After your first few days, you can advance to a soft, low residue diet.   Keep on this diet until everything goes down easily.  Hot Foods Cold Foods  White fish Cottage cheese  Stuffed fish Junior baby fruit  Baby food meals Semi thickened juices  Minced soft cooked, scrambled, poached eggs nectars  Souffle & omelets Ripe mashed bananas  Cooked cereals Canned fruit, pineapple sauce, milk  potatoes Milkshake  Buttered or Alfredo noodles Custard  Cooked cooled vegetable Puddings, including tapioca  Sherbet Yogurt  Vegetable soup or alphabet soup Fruit ice, Italian ice  Gravies Whipped gelatin  Sugar, syrup, honey, jelly Junior baby desserts  Sauces:  Cheese, creamed, barbecue, tomato, white Cream  Coffee or tea Margarine   SAMPLE MENU:  LEVEL 2 Breakfast Lunch Dinner  Orange juice, 1/2 cup Oatmeal, 1/2 cup Scrambled eggs with cheese, 1/2 cup Decaffeinated tea, 1 cup Whole milk, 1 cup Non-dairy creamer, 2 Tbsp  Pineapple juice, 1/2 cup Minced beef, 3 oz Gravy, 2 Tbsp Mashed potatoes, 1/2 cup Minced fresh broccoli, 1/2 cup Applesauce, 1/2 cup Coffee, 1 cup Turkey, barley soup, 3/4 cup Minced Hawaiian chicken, 3 oz Mashed potatoes, 1/2 cup Cooked spinach, 1/2 cup Frozen yogurt, 1/2 cup Non-dairy creamer, 2 Tbsp      LEVEL 3 = CHOPPED DIET  -After all the foods in level 2 (soft diet) are passing through well you should advance up to more chopped foods.  -It is still important to cut these foods into small pieces and eat slowly.  Hot Foods Cold Foods  Poultry Cottage cheese  Chopped Swedish meatballs Yogurt  Meat salads (ground or flaked meat) Milk  Flaked fish (tuna) Milkshakes  Poached or scrambled eggs Soft, cold, dry cereal  Souffles and omelets Fruit juices or nectars  Cooked cereals Chopped canned   fruit  Chopped French toast or pancakes Canned fruit cocktail  Noodles or pasta (no rice) Pudding, mousse, custard  Cooked vegetables (no frozen peas, corn, or mixed vegetables) Green salad  Canned small sweet peas Ice cream  Creamed soup or vegetable soup Fruit ice, Italian ice  Pureed vegetable soup or alphabet soup Non-dairy creamer  Ground scalloped apples Margarine  Gravies Mayonnaise  Sauces:  Cheese, creamed, barbecue, tomato, white Ketchup  Coffee or tea Mustard   SAMPLE MENU:  LEVEL 3 Breakfast Lunch Dinner  Orange juice, 1/2 cup Oatmeal, 1/2 cup Scrambled eggs with cheese, 1/2 cup Decaffeinated tea, 1 cup Whole milk, 1 cup Non-dairy creamer, 2 Tbsp Ketchup, 1 Tbsp Margarine, 1 tsp Salt, 1/4 tsp Sugar, 2 tsp Pineapple juice, 1/2 cup Ground beef, 3 oz Gravy, 2 Tbsp Mashed potatoes, 1/2 cup Cooked spinach, 1/2 cup Applesauce, 1/2 cup Decaffeinated coffee Whole milk Non-dairy creamer, 2 Tbsp Margarine, 1 tsp Salt, 1/4 tsp Pureed turkey, barley soup, 3/4 cup Barbecue chicken, 3 oz Mashed potatoes, 1/2 cup Ground fresh broccoli, 1/2 cup Frozen yogurt, 1/2  cup Decaffeinated tea, 1 cup Non-dairy creamer, 2 Tbsp Margarine, 1 tsp Salt, 1/4 tsp Sugar, 1 tsp    LEVEL 4:  HIGH FIBER DIET / REGULAR FOODS  -Foods in this group are soft, moist, regularly textured foods.   -This level includes meat and breads, which tend to be the hardest things to swallow.   -Eat very slowly, chew well and continue to avoid carbonated drinks. -most people are at this level in 2-4 weeks  Hot Foods Cold Foods  Baked fish or skinned Soft cheeses - cottage cheese  Souffles and omelets Cream cheese  Eggs Yogurt  Stuffed shells Milk  Spaghetti with meat sauce Milkshakes  Cooked cereal Cold dry cereals (no nuts, dried fruit, coconut)  French toast or pancakes Crackers  Buttered toast Fruit juices or nectars  Noodles or pasta (no rice) Canned fruit  Potatoes (all types) Ripe bananas  Soft, cooked vegetables (no corn, lima, or baked beans) Peeled, ripe, fresh fruit  Creamed soups or vegetable soup Cakes (no nuts, dried fruit, coconut)  Canned chicken noodle soup Plain doughnuts  Gravies Ice cream  Bacon dressing Pudding, mousse, custard  Sauces:  Cheese, creamed, barbecue, tomato, white Fruit ice, Italian ice, sherbet  Decaffeinated tea or coffee Whipped gelatin  Pork chops Regular gelatin   Canned fruited gelatin molds   Sugar, syrup, honey, jam, jelly   Cream   Non-dairy   Margarine   Oil   Mayonnaise   Ketchup   Mustard   TROUBLESHOOTING IRREGULAR BOWELS  1) Avoid extremes of bowel movements (no bad constipation/diarrhea)  2) Miralax 17gm mixed in 8oz. water or juice-daily. May use BID as needed.  3) Gas-x,Phazyme, etc. as needed for gas & bloating.  4) Soft,bland diet. No spicy,greasy,fried foods.  5) Prilosec over-the-counter as needed  6) May hold gluten/wheat products from diet to see if symptoms improve.  7) May try probiotics (Align, Activa, etc) to help calm the bowels down  7) If symptoms become worse call back immediately.    If you  have any questions please call our office at [facility]: [phone].     This information is not intended to replace advice given to you by your health care provider. Make sure you discuss any questions you have with your health care provider.      Bowel Obstruction A bowel obstruction is a blockage in the small or large bowel. The   bowel, which is also called the intestine, is a long, slender tube that connects the stomach to the anus. When a person eats and drinks, food and fluids go from the mouth to the stomach to the small bowel. This is where most of the nutrients in the food and fluids are absorbed. After the small bowel, material passes through the large bowel for further absorption until any leftover material leaves the body as stool through the anus during a bowel movement. A bowel obstruction will prevent food and fluids from passing through the bowel as they normally do during digestion. The bowel can become partially or completely blocked. If this condition is not treated, it can be dangerous because the bowel could rupture. What are the causes? Common causes of this condition include: Scar tissue (adhesions) from previous surgery or treatment with high-energy X-rays (radiation). Recent surgery. This may cause the movements of the bowel to slow down and cause food to block the intestine. Inflammatory bowel disease, such as Crohn's disease or diverticulitis. Growths or tumors. A bulging organ (hernia). Twisting of the bowel (volvulus). A foreign body. Slipping of a part of the bowel into another part (intussusception). What are the signs or symptoms? Symptoms of this condition include: Pain in the abdomen. Depending on the degree of obstruction, pain may be: Mild or severe. Dull cramping or sharp pain. In one area or in the entire abdomen. Nausea and vomiting. Vomit may be greenish or a yellow bile color. Bloating in the abdomen. Difficulty passing stool  (constipation). Lack of passing gas. Frequent belching. Diarrhea. This may occur if the obstruction is partial and runny stool is able to leak around the obstruction. How is this diagnosed? This condition may be diagnosed based on: A physical exam. Medical history. Imaging tests of the abdomen or pelvis, such as X-ray or CT scan. Blood or urine tests. How is this treated? Treatment for this condition depends on the cause and severity of the problem. Treatment may include: Fluids and pain medicines that are given through an IV. Your health care provider may instruct you not to eat or drink if you have nausea or vomiting. Eating a simple diet. You may be asked to consume a clear liquid diet for several days. This allows the bowel to rest. Placement of a small tube (nasogastric tube) into the stomach. This will relieve pain, discomfort, and nausea by removing blocked air and fluids from the stomach. It can also help the obstruction clear up faster. Surgery. This may be required if other treatments do not work. Surgery may be required for: Bowel obstruction from a hernia. This can be an emergency procedure. Scar tissue that causes frequent or severe obstructions. Follow these instructions at home: Medicines Take over-the-counter and prescription medicines only as told by your health care provider. If you were prescribed an antibiotic medicine, take it as told by your health care provider. Do not stop taking the antibiotic even if you start to feel better. General instructions Follow instructions from your health care provider about eating restrictions. You may need to avoid solid foods and consume only clear liquids until your condition improves. Return to your normal activities as told by your health care provider. Ask your health care provider what activities are safe for you. Avoid sitting for a long time without moving. Get up to take short walks every 1-2 hours. This is important to improve  blood flow and breathing. Ask for help if you feel weak or unsteady. Keep all follow-up visits   as told by your health care provider. This is important. How is this prevented? After having a bowel obstruction, you are more likely to have another. You may do the following things to prevent another obstruction: If you have a long-term (chronic) disease, pay attention to your symptoms and contact your health care provider if you have questions or concerns. Avoid becoming constipated. To prevent or treat constipation, your health care provider may recommend that you: Drink enough fluid to keep your urine pale yellow. Take over-the-counter or prescription medicines. Eat foods that are high in fiber, such as beans, whole grains, and fresh fruits and vegetables. Limit foods that are high in fat and processed sugars, such as fried or sweet foods. Stay active. Exercise for 30 minutes or more, 5 or more days each week. Ask your health care provider which exercises are safe for you. Avoid stress. Find ways to reduce stress, such as meditation, exercise, or taking time for activities that relax you. Instead of eating three large meals each day, eat three small meals with three small snacks. Work with a dietitian to make a healthy meal plan that works for you. Do not use any products that contain nicotine or tobacco, such as cigarettes and e-cigarettes. If you need help quitting, ask your health care provider. Contact a health care provider if you: Have a fever. Have chills. Get help right away if you: Have increased pain or cramping. Vomit blood. Have uncontrolled vomiting or nausea. Cannot drink fluids because of vomiting or pain. Become confused. Begin feeling very thirsty (dehydrated). Have severe bloating. Feel extremely weak or you faint. Summary A bowel obstruction is a blockage in the small or large bowel. A bowel obstruction will prevent food and fluids from passing through the bowel as they  normally do during digestion. Treatment for this condition depends on the cause and severity of the problem. It may include fluids and pain medicines through an IV, a simple diet, a nasogastric tube, or surgery. Follow instructions from your health care provider about eating restrictions. You may need to avoid solid foods and consume only clear liquids until your condition improves.   

## 2023-03-17 NOTE — Progress Notes (Signed)
Nurse reviewed discharge instructions with pt. Pt verbalized understanding of discharge instructions and follow up appointment.  Pt's prescription medications, cigarettes and lighter returned to her prior to discharge.

## 2023-03-17 NOTE — Discharge Summary (Signed)
Physician Discharge Summary    Patient ID: Judith Miller MRN: [id number] DOB/AGE: 1960-11-02  62 y.o.  Patient Care Team: Practice, [facility] as PCP - General Fritzi Mandes, MD as Consulting Physician (General Surgery) Lanelle Bal, DO as Consulting Physician (Gastroenterology) Karie Soda, MD as Consulting Physician (Colon and Rectal Surgery)  Admit date: 03/11/2023  Discharge date: 03/17/2023  Hospital Stay = 6 days    Discharge Diagnoses:  Principal Problem:   Ile[facility]) Active Problems:   Bipolar affective disorder, currently depressed, mild (HCC)   Bipolar disorder (HCC)   Tobacco abuse   Arthritis   Hypokalemia   Anxiety      03/01/2023   POST-OPERATIVE DIAGNOSIS:   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  Very dense adhesions of small bowel to the retroperitoneum especially in the pelvis circumferentially.  Couple areas causing partial obstruction with stricturing especially at the distal ileum and mid ileum.  Ultimately had to resect section of small bowel with extremely dense stricture in enterotomy and thin serosal areas due to extremely dense adhesions.   Rectosigmoid resection done dense fibrotic inflammation with chronic white pigtail percutaneous drain resting in the left anterolateral pelvis near the proximal rectum and stricturing.  Phlegmon and inflammation but no active abscess.  No  obvious metastatic disease on visceral parietal peritoneum or liver.   It is a 29mm EEA anastomosis ( distal descending colon  connected to mid rectum.)  It rests 9 cm from the anal verge by rigid proctoscopy.   CASE DATA: Type of patient?: Elective [facility] Private Case Status of Case? Elective Scheduled Infection Present At Time Of Surgery (PATOS)?  PHLEGMON   PATHOLOGY   FINAL MICROSCOPIC DIAGNOSIS:  A. APPENDIX: - Benign appendix  B. LOOP, TRANSVERSE COLOSTOMY: - Portion of colon with benign skin, consistent with colostomy site - No malignancy identified  C. ILEUM, RESECTION: - Portion of ileum with serosal adhesions - Two benign lymph nodes - No malignancy identified  D. RECTOSIGMOID COLON, RESECTION: - Portion of colon with diverticulosis and serosal adhesions - Margins are viable - Two benign lymph nodes - No malignancy identified  E. FINAL DISTAL MARGIN, EXCISION: - Benign colonic mucosa with mild hyperplastic change - No malignancy identified    SURGEON:    * Surgery not found *  Consults: Case Management / Social Work  Hospital Course:   Patient underwent extensive resection for chronic sigmoid stricture requiring small bowel resection and appendectomy as well.  Went home initially did well but 2 weeks later had nausea vomiting abdominal pain.  Came to the emergency department with concerns of ileus versus partial small bowel obstruction.  She is rehydrated nasogastric tube in place.  Eventually opened up.  Initially did not tolerate clamping trial but on the second attempt tolerated well.  Advanced on her diet.  By the time of discharge, the patient was walking well the hallways, eating food, having flatus.  Pain was well-controlled on an oral medications.  Based on meeting discharge criteria and continuing to  recover, I felt it was safe for the patient to be discharged from the hospital to further recover with close followup. Postoperative recommendations were  discussed in detail.  They are written as well.  Discharged Condition: good  Discharge Exam: Blood pressure (!) 102/59, pulse (!) 56, temperature (!) 97.4 F (36.3 C), temperature source Oral, resp. rate 18, height 5\' 6"  (1.676 m), weight 76.1 kg, SpO2 97%.  General: Pt awake/alert/oriented x4 in No acute distress Eyes: PERRL, normal EOM.  Sclera clear.  No icterus Neuro: CN II-XII intact w/o focal sensory/motor deficits. Lymph: No head/neck/groin lymphadenopathy Psych:  No delerium/psychosis/paranoia HENT: Normocephalic, Mucus membranes moist.  No thrush Neck: Supple, No tracheal deviation Chest:  No chest wall pain w good excursion CV:  Pulses intact.  Regular rhythm MS: Normal AROM mjr joints.  No obvious deformity Abdomen: Soft.  Nondistended.  Nontender.  No evidence of peritonitis.  No incarcerated hernias. Ext:  SCDs BLE.  No mjr edema.  No cyanosis Skin: No petechiae / purpura   Disposition:    Follow-up Information     Karie Soda, MD. Schedule an appointment as soon as possible for a visit in 3 week(s).   Specialties: General Surgery, Colon and Rectal Surgery Why: To follow up after your hospital stay Contact information: [address] [place] [address] [phone]                 Discharge disposition: 01-Home or Self Care       Discharge Instructions     Call MD for:   Complete by: As directed    FEVER > 101.5 F  (temperatures < 101.5 F are not significant)   Call MD for:  extreme fatigue   Complete by: As directed    Call MD for:  persistant dizziness or light-headedness   Complete by: As directed    Call MD for:  persistant nausea and vomiting   Complete by: As directed    Call MD for:  redness, tenderness, or signs of infection (pain, swelling, redness, odor or green/yellow discharge around incision site)   Complete by: As directed    Call MD for:  severe uncontrolled pain   Complete by: As directed    Diet - low  sodium heart healthy   Complete by: As directed    Start with a bland diet such as soups, liquids, starchy foods, low fat foods, etc. the first few days at home. Gradually advance to a solid, low-fat, high fiber diet by the end of the first week at home.   Add a fiber supplement to your diet (Metamucil, etc) If you feel full, bloated, or constipated, stay on a full liquid or pureed/blenderized diet for a few days until you feel better and are no longer constipated.   Discharge instructions   Complete by: As directed    See Discharge Instructions If you are not getting better after two weeks or are noticing you are getting worse, contact our office [phone] for further advice.  We may need to adjust your medications, re-evaluate you in the office, send you to the emergency room, or see what other things we can do to help. The clinic staff is available to answer your questions during regular business hours (8:30am-5pm).  Please don't hesitate to call and ask to speak to one of our nurses for clinical concerns.    A surgeon from [facility] is always on call at the hospitals 24 hours/day If you  have a medical emergency, go to the nearest emergency room or call 911.   Discharge wound care:   Complete by: As directed    It is good for closed incisions and even open wounds to be washed every day.  Shower every day.  Short baths are fine.  Wash the incisions and wounds clean with soap & water.    You may leave closed incisions open to air if it is dry.   You may cover the incision with clean gauze & replace it after your daily shower for comfort.   Driving Restrictions   Complete by: As directed    You may drive when: - you are no longer taking narcotic prescription pain medication - you can comfortably wear a seatbelt - you can safely make sudden turns/stops without pain.   Increase activity slowly   Complete by: As directed    Start light daily activities --- self-care,  walking, climbing stairs- beginning the day after surgery.  Gradually increase activities as tolerated.  Control your pain to be active.  Stop when you are tired.  Ideally, walk several times a day, eventually an hour a day.   Most people are back to most day-to-day activities in a few weeks.  It takes 4-6 weeks to get back to unrestricted, intense activity. If you can walk 30 minutes without difficulty, it is safe to try more intense activity such as jogging, treadmill, bicycling, low-impact aerobics, swimming, etc. Save the most intensive and strenuous activity for last (Usually 4-8 weeks after surgery) such as sit-ups, heavy lifting, contact sports, etc.  Refrain from any intense heavy lifting or straining until you are off narcotics for pain control.  You will have off days, but things should improve week-by-week. DO NOT PUSH THROUGH PAIN.  Let pain be your guide: If it hurts to do something, don't do it.   Lifting restrictions   Complete by: As directed    If you can walk 30 minutes without difficulty, it is safe to try more intense activity such as jogging, treadmill, bicycling, low-impact aerobics, swimming, etc. Save the most intensive and strenuous activity for last (Usually 4-8 weeks after surgery) such as sit-ups, heavy lifting, contact sports, etc.   Refrain from any intense heavy lifting or straining until you are off narcotics for pain control.  You will have off days, but things should improve week-by-week. DO NOT PUSH THROUGH PAIN.  Let pain be your guide: If it hurts to do something, don't do it.  Pain is your body warning you to avoid that activity for another week until the pain goes down.   May shower / Bathe   Complete by: As directed    May walk up steps   Complete by: As directed    Sexual Activity Restrictions   Complete by: As directed    You may have sexual intercourse when it is comfortable. If it hurts to do something, stop.       Allergies as of 03/17/2023        Reactions   Aripiprazole    Hallucinations    Sulfa Antibiotics Other (See Comments)   Headaches        Medication List     TAKE these medications    acetaminophen 500 MG tablet Commonly known as: TYLENOL Take 500 mg by mouth every 6 (six) hours as needed for mild pain or moderate pain.   BEET ROOT PO Take 1,000 mg by mouth daily.   Co Q-10 100  MG Caps Take 100 mg by mouth 2 (two) times daily.   COLLAGEN PO Take 3 g by mouth daily.   levocetirizine 5 MG tablet Commonly known as: XYZAL Take 5 mg by mouth every evening.   Magnesium 250 MG Tabs Take 250 mg by mouth in the morning.   methocarbamol 500 MG tablet Commonly known as: ROBAXIN Take 1 tablet (500 mg total) by mouth every 8 (eight) hours as needed for muscle spasms.   multivitamin tablet Take 1 tablet by mouth daily.   Normal Saline Flush 0.9 % Soln Inject 10 MLs by intracatheter daily   Normal Saline Flush 0.9 % Soln Flush with 10 mLs by Intracatheter route daily.   OVER THE COUNTER MEDICATION Take 1 tablet by mouth 2 (two) times daily. Medication: Brain & Memory Performance   oxyCODONE 5 MG immediate release tablet Commonly known as: Oxy IR/ROXICODONE Take 1-2 tablets (5-10 mg total) by mouth every 6 (six) hours as needed for moderate pain, severe pain or breakthrough pain.   Potassium 99 MG Tabs Take 99 mg by mouth daily.   Turmeric 500 MG Caps Take 500 mg by mouth daily.   VITAMIN B-12 PO Take 2,500 mcg by mouth daily.   Vitamin D3 10 MCG (400 UNIT) tablet Take 400 Units by mouth daily.   vitamin E 180 MG (400 UNITS) capsule Take 400 Units by mouth daily.               Discharge Care Instructions  (From admission, onward)           Start     Ordered   03/17/23 0000  Discharge wound care:       Comments: It is good for closed incisions and even open wounds to be washed every day.  Shower every day.  Short baths are fine.  Wash the incisions and wounds clean with soap &  water.    You may leave closed incisions open to air if it is dry.   You may cover the incision with clean gauze & replace it after your daily shower for comfort.   03/17/23 1121            Significant Diagnostic Studies:  Results for orders placed or performed during the hospital encounter of 03/11/23 (from the past 72 hour(s))  Phosphorus     Status: None   Collection Time: 03/15/23  5:07 AM  Result Value Ref Range   Phosphorus 2.7 2.5 - 4.6 mg/dL    Comment: Performed at  [facility], [address], [place], [place] [address]  Potassium     Status: Abnormal   Collection Time: 03/15/23  5:08 AM  Result Value Ref Range   Potassium 3.4 (L) 3.5 - 5.1 mmol/L    Comment: Performed at [facility], [address], [place], [place] [address]  Creatinine, serum     Status: None   Collection Time: 03/15/23  5:08 AM  Result Value Ref Range   Creatinine, Ser 0.50 0.44 - 1.00 mg/dL   GFR, Estimated >09 >81 mL/min    Comment: (NOTE) Calculated using the CKD-EPI Creatinine Equation (2021) Performed at [facility], [address], [place], [place] [address]   Basic metabolic panel     Status: Abnormal   Collection Time: 03/16/23  5:02 AM  Result Value Ref Range   Sodium 134 (L) 135 - 145 mmol/L   Potassium 3.8 3.5 - 5.1 mmol/L   Chloride 98 98 -  111 mmol/L   CO2 27 22 - 32 mmol/L   Glucose, Bld 71 70 - 99 mg/dL    Comment: Glucose reference range applies only to samples taken after fasting for at least 8 hours.   BUN <5 (L) 8 - 23 mg/dL   Creatinine, Ser 1.61 0.44 - 1.00 mg/dL   Calcium 8.9 8.9 - 09.6 mg/dL   GFR, Estimated >04 >54 mL/min    Comment: (NOTE) Calculated using the CKD-EPI Creatinine Equation (2021)    Anion gap 9 5 - 15    Comment: Performed at [facility], [address], [place], [place] [address]  CBC     Status: Abnormal   Collection Time: 03/16/23  5:02 AM  Result Value Ref Range    WBC 7.1 4.0 - 10.5 K/uL   RBC 3.79 (L) 3.87 - 5.11 MIL/uL   Hemoglobin 11.2 (L) 12.0 - 15.0 g/dL   HCT 91.4 (L) 78.2 - 95.6 %   MCV 93.9 80.0 - 100.0 fL   MCH 29.6 26.0 - 34.0 pg   MCHC 31.5 30.0 - 36.0 g/dL   RDW 21.3 08.6 - 57.8 %   Platelets 342 150 - 400 K/uL   nRBC 0.0 0.0 - 0.2 %    Comment: Performed at [facility], [address] [place], [place] [address]  Basic metabolic panel     Status: Abnormal   Collection Time: 03/17/23  4:12 AM  Result Value Ref Range   Sodium 137 135 - 145 mmol/L   Potassium 4.0 3.5 - 5.1 mmol/L   Chloride 101 98 - 111 mmol/L   CO2 28 22 - 32 mmol/L   Glucose, Bld 82 70 - 99 mg/dL    Comment: Glucose reference range applies only to samples taken after fasting for at least 8 hours.   BUN 6 (L) 8 - 23 mg/dL   Creatinine, Ser 9.52 0.44 - 1.00 mg/dL   Calcium 9.4 8.9 - 84.1 mg/dL   GFR, Estimated >32 >44 mL/min    Comment: (NOTE) Calculated using the CKD-EPI Creatinine Equation (2021)    Anion gap 8 5 - 15    Comment: Performed at [facility], [address], [place], [place] [address]    DG Abd Portable 1V  Result Date: 03/12/2023 CLINICAL DATA:  Evaluate small bowel obstruction. EXAM: PORTABLE ABDOMEN - 1 VIEW COMPARISON:  03/11/23 FINDINGS: NG tube is identified within the left upper quadrant of the abdomen with tip and side port below the GE junction within the stomach. Persistent dilated small bowel loops within the left upper quadrant and left lower quadrant of the abdomen measuring up to 5 cm in diameter. Paucity the of colonic gas. IMPRESSION: 1. Persistent abnormally dilated loops of small bowel in the left hemiabdomen compatible with either small bowel ileus or obstruction. Electronically Signed   By: Signa Kell M.D.   On: 03/12/2023 10:19   DG Abd Portable 1V  Result Date: 03/11/2023 CLINICAL DATA:  NG tube placement EXAM: PORTABLE ABDOMEN - 1 VIEW COMPARISON:  None Available. FINDINGS: Tip of NG  tube is seen in the stomach in the medial aspect of the fundus. There are dilated small bowel loops in left upper abdomen. Transverse diameter of heart is increased. There are no signs of pulmonary edema or focal pulmonary consolidation. IMPRESSION: To both NG tube is seen in the stomach. There is dilation of small bowel loops suggesting ileus or partial obstruction. Electronically Signed   By: Rhae Hammock  Rathinasamy M.D.   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   Deviated septum    Fistula of intestine    Headache    sinus   Pelvic abscess in female 03/23/2022   Perforation of sigmoid colon (HCC) 11/27/2021    Past Surgical History:  Procedure Laterality Date   BOWEL RESECTION N/A 12/15/2021   Procedure: SMALL BOWEL RESECTION;  Surgeon: Fritzi Mandes, MD;  Location: [facility] OR;  Service: General;  Laterality: N/A;   CESAREAN SECTION  1989   COLONOSCOPY WITH PROPOFOL N/A 09/12/2022   Procedure: COLONOSCOPY WITH PROPOFOL;  Surgeon: Lanelle Bal, DO;  Location: [facility] ENDO SUITE;  Service: Endoscopy;  Laterality: N/A;  2:00 pm, pt unsure if she will have a ride   COLONOSCOPY WITH PROPOFOL N/A 12/27/2022   Procedure: COLONOSCOPY WITH PROPOFOL;  Surgeon: Lanelle Bal, DO;  Location: [facility] END[facility] SU[facility];  Service: Endoscopy;  Laterality: N/A;  11:15am, asa 2, via colostomy/rectum   FLEXIBLE SIGMOIDOSCOPY N/A 03/01/2023   Procedure: FLEXIBLE SIGMOIDOSCOPY;  Surgeon: Karie Soda, MD;  Location: [facility] ORS;  Service: General;  Laterality: N/A;   IR CATHETER TUBE CHANGE  12/27/2021   IR CATHETER TUBE CHANGE  03/24/2022   IR RADIOLOGIST EVAL & MGMT  01/26/2022   IR RADIOLOGIST EVAL & MGMT  04/14/2022   IR RADIOLOGIST EVAL & MGMT  06/08/2022   KNEE ARTHROSCOPY Left    LAPAROTOMY N/A 12/15/2021   Procedure: EXPLORATORY LAPAROTOMY;  Surgeon: Fritzi Mandes, MD;  Location:  [facility] OR;  Service: General;  Laterality: N/A;   POLYPECTOMY  12/27/2022   Procedure: POLYPECTOMY;  Surgeon: Lanelle Bal, DO;  Location[facility];  Service: Endoscopy;;   TRANSVERSE LOOP COLOSTOMY N/A 12/15/2021   Procedure: TRANSVERSE LOOP COLOSTOMY;  Surgeon: Fritzi Mandes, MD;  Location: [facility] OR;  Service: General;  Laterality: N/A;   UPPER GI ENDOSCOPY     XI ROBOTIC ASSISTED SMALL BOWEL RESECTION N/A 03/01/2023   Procedure: ROBOTIC RESECTION OF RECTOSIGMOID COLON, TAKEDOWN OF LOOP COLOSTOMY, LYSIS OF ADHESIONS, APPENDECTOMY, SMALL BOWEL RESECTION, BILATERAL TAP BLOCK, TISSUE PERFUSION ASSESSMENT VIA FIREFLY INJECTION;  Surgeon: Karie Soda, MD;  Location: [facility] ORS;  Service: General;  Laterality: N/A;  GEN w/ERAS PATHWAY EXPEREL    Social History   Socioeconomic History   Marital status: Single    Spouse name: Not on file   Number of children: Not on file   Years of education: Not on file   Highest education level: Not on file  Occupational History   Not on file  Tobacco Use   Smoking status: Former    Current packs/day: 0.00    Average packs/day: 0.5 packs/day for 40.0 years (20.0 ttl pk-yrs)    Types: Cigarettes    Start date: 12/18/1982    Quit date: 12/18/2022    Years since quitting: 0.2   Smokeless tobacco: Never  Vaping Use   Vaping status: Never Used  Substance and Sexual Activity   Alcohol use: Never   Drug use: Never   Sexual activity: Not on file  Other Topics Concern   Not on file  Social History Narrative   Not on file   Social Determinants of Health   Financial Resource Strain: Not on file  Food Insecurity: No Food Insecurity (03/11/2023)   Hunger Vital Sign    Worried About  Running Out of Food in the Last Year: Never true    Ran Out of Food in the Last Year: Never true  Transportation Needs: No Transportation Needs (03/11/2023)   PRAPARE - Administrator, Civil Service (Medical): No    Lack of Transportation (Non-Medical): No   Physical Activity: Not on file  Stress: Not on file  Social Connections: Not on file  Intimate Partner Violence: Not At Risk (03/11/2023)   Humiliation, Afraid, Rape, and Kick questionnaire    Fear of Current or Ex-Partner: No    Emotionally Abused: No    Physically Abused: No    Sexually Abused: No    Family History  Problem Relation Age of Onset   Colon cancer Neg Hx     Current Facility-Administered Medications  Medication Dose Route Frequency Provider Last Rate Last Admin   0.9 %  sodium chloride infusion  250 mL Intravenous PRN Karie Soda, MD       alum & mag hydroxide-simeth (MAALOX/MYLANTA) 200-200-20 MG/5ML suspension 30 mL  30 mL Oral Q6H PRN Karie Soda, MD       bisacodyl (DULCOLAX) suppository 10 mg  10 mg Rectal Daily Karie Soda, MD   10 mg at 03/16/23 1010   diphenhydrAMINE (BENADRYL) capsule 25 mg  25 mg Oral Q6H PRN Phylliss Blakes A, MD       Or   diphenhydrAMINE (BENADRYL) injection 25 mg  25 mg Intravenous Q6H PRN Phylliss Blakes A, MD       enoxaparin (LOVENOX) injection 40 mg  40 mg Subcutaneous Q24H Phylliss Blakes A, MD   40 mg at 03/16/23 2044   hydrALAZINE (APRESOLINE) injection 10 mg  10 mg Intravenous Q2H PRN Phylliss Blakes A, MD       HYDROmorphone (DILAUDID) injection 0.5 mg  0.5 mg Intravenous Q4H PRN Phylliss Blakes A, MD   0.5 mg at 03/17/23 0759   lactated ringers bolus 1,000 mL  1,000 mL Intravenous Q8H PRN Karie Soda, MD       lactated ringers bolus 1,000 mL  1,000 mL Intravenous Q8H PRN Karie Soda, MD       lip balm (CARMEX) ointment   Topical PRN Kinsinger, De Blanch, MD   Given at 03/12/23 1226   loratadine (CLARITIN) tablet 10 mg  10 mg Oral QPM Ellington, Abby K, RPH   10 mg at 03/16/23 1617   LORazepam (ATIVAN) injection 0.5-1 mg  0.5-1 mg Intravenous Q8H PRN Karie Soda, MD       magic mouthwash  15 mL Oral QID PRN Karie Soda, MD       melatonin tablet 3 mg  3 mg Oral QHS PRN Berna Bue, MD        menthol-cetylpyridinium (CEPACOL) lozenge 3 mg  1 lozenge Oral PRN Phylliss Blakes A, MD   3 mg at 03/13/23 0810   methocarbamol (ROBAXIN) 500 mg in dextrose 5 % 50 mL IVPB  500 mg Intravenous Trixie Deis, MD 110 mL/hr at 03/17/23 0526 500 mg at 03/17/23 0526   metoCLOPramide (REGLAN) injection 5 mg  5 mg Intravenous Trecia Rogers, MD   5 mg at 03/17/23 0524   metoprolol tartrate (LOPRESSOR) injection 5 mg  5 mg Intravenous Q6H PRN Phylliss Blakes A, MD       ondansetron (ZOFRAN-ODT) disintegrating tablet 4 mg  4 mg Oral Q6H PRN Berna Bue, MD       Or   ondansetron (ZOFRAN) injection 4 mg  4 mg  Intravenous Q6H PRN Phylliss Blakes A, MD   4 mg at 03/11/23 1609   phenol (CHLORASEPTIC) mouth spray 2 spray  2 spray Mouth/Throat PRN Karie Soda, MD       potassium chloride SA (KLOR-CON M) CR tablet 40 mEq  40 mEq Oral BID Karie Soda, MD   40 mEq at 03/17/23 1006   prochlorperazine (COMPAZINE) tablet 10 mg  10 mg Oral Q6H PRN Berna Bue, MD       Or   prochlorperazine (COMPAZINE) injection 5-10 mg  5-10 mg Intravenous Q6H PRN Berna Bue, MD       psyllium (HYDROCIL/METAMUCIL) 1 packet  1 packet Oral BID Karie Soda, MD   1 packet at 03/17/23 1007   simethicone (MYLICON) 40 MG/0.6ML suspension 80 mg  80 mg Oral QID PRN Karie Soda, MD       sodium chloride flush (NS) 0.9 % injection 3 mL  3 mL Intravenous Catha Gosselin, MD   3 mL at 03/17/23 1009   sodium chloride flush (NS) 0.9 % injection 3 mL  3 mL Intravenous PRN Karie Soda, MD         Allergies  Allergen Reactions   Aripiprazole     Hallucinations    Sulfa Antibiotics Other (See Comments)    Headaches     Signed:   Ardeth Sportsman, MD, FACS, MASCRS Esophageal, Gastrointestinal & Colorectal Surgery Robotic and Minimally Invasive Surgery  [facility] [address], [address] [place], [place] [address] [phone] Fax [phone] Main  CONTACT INFORMATION:  Weekday (9AM-5PM): Call [facility] main office at [phone]  Weeknight (5PM-9AM) or Weekend/Holiday: Check [email or web address] (password " [email or web address]") for General Surgery [facility] coverage  (Please, do not use SecureChat as it is not reliable communication to reach operating surgeons for immediate patient care given surgeries/outpatient duties/clinic/cross-coverage/off post-call which would lead to a delay in care.  Epic staff messaging available for outptient concerns, but may not be answered for 48 hours or more).     03/17/2023, 11:22 AM

## 2023-03-21 ENCOUNTER — Other Ambulatory Visit: Payer: Self-pay

## 2023-03-21 ENCOUNTER — Observation Stay (HOSPITAL_COMMUNITY)
Admission: EM | Admit: 2023-03-21 | Discharge: 2023-03-23 | Disposition: A | Discharge status: Home / Self Care | Payer: 59 | Attending: Surgery | Admitting: Surgery

## 2023-03-21 DIAGNOSIS — K56699 Other intestinal obstruction unspecified as to partial versus complete obstruction: Principal | ICD-10-CM | POA: Insufficient documentation

## 2023-03-21 DIAGNOSIS — K435 Parastomal hernia without obstruction or  gangrene: Secondary | ICD-10-CM | POA: Insufficient documentation

## 2023-03-21 DIAGNOSIS — R52 Pain, unspecified: Secondary | ICD-10-CM | POA: Insufficient documentation

## 2023-03-21 DIAGNOSIS — F419 Anxiety disorder, unspecified: Secondary | ICD-10-CM | POA: Diagnosis present

## 2023-03-21 DIAGNOSIS — K56609 Unspecified intestinal obstruction, unspecified as to partial versus complete obstruction: Secondary | ICD-10-CM | POA: Diagnosis not present

## 2023-03-21 DIAGNOSIS — K5732 Diverticulitis of large intestine without perforation or abscess without bleeding: Secondary | ICD-10-CM | POA: Diagnosis not present

## 2023-03-21 DIAGNOSIS — K5750 Diverticulosis of both small and large intestine without perforation or abscess without bleeding: Secondary | ICD-10-CM | POA: Diagnosis not present

## 2023-03-21 DIAGNOSIS — F3131 Bipolar disorder, current episode depressed, mild: Secondary | ICD-10-CM | POA: Diagnosis present

## 2023-03-21 DIAGNOSIS — R109 Unspecified abdominal pain: Secondary | ICD-10-CM | POA: Diagnosis not present

## 2023-03-21 DIAGNOSIS — F319 Bipolar disorder, unspecified: Secondary | ICD-10-CM | POA: Diagnosis present

## 2023-03-21 DIAGNOSIS — R1010 Upper abdominal pain, unspecified: Secondary | ICD-10-CM | POA: Diagnosis not present

## 2023-03-21 DIAGNOSIS — Z720 Tobacco use: Secondary | ICD-10-CM | POA: Diagnosis present

## 2023-03-21 LAB — COMPREHENSIVE METABOLIC PANEL
ALT: 19 U/L (ref 0–44)
AST: 18 U/L (ref 15–41)
Albumin: 4.2 g/dL (ref 3.5–5.0)
Alkaline Phosphatase: 57 U/L (ref 38–126)
Anion gap: 10 (ref 5–15)
BUN: 11 mg/dL (ref 8–23)
CO2: 28 mmol/L (ref 22–32)
Calcium: 10 mg/dL (ref 8.9–10.3)
Chloride: 97 mmol/L — ABNORMAL LOW (ref 98–111)
Creatinine, Ser: 0.63 mg/dL (ref 0.44–1.00)
GFR, Estimated: >60 mL/min (ref >60)
Glucose, Bld: 98 mg/dL (ref 70–99)
Potassium: 3.6 mmol/L (ref 3.5–5.1)
Sodium: 135 mmol/L (ref 135–145)
Total Bilirubin: 0.4 mg/dL (ref 0.3–1.2)
Total Protein: 8.2 g/dL — ABNORMAL HIGH (ref 6.5–8.1)

## 2023-03-21 LAB — CBC
HCT: 40.8 % (ref 36.0–46.0)
Hemoglobin: 13.1 g/dL (ref 12.0–15.0)
MCH: 29.8 pg (ref 26.0–34.0)
MCHC: 32.1 g/dL (ref 30.0–36.0)
MCV: 92.7 fL (ref 80.0–100.0)
Platelets: 406 10*3/uL — ABNORMAL HIGH (ref 150–400)
RBC: 4.4 MIL/uL (ref 3.87–5.11)
RDW: 13.2 % (ref 11.5–15.5)
WBC: 10.3 10*3/uL (ref 4.0–10.5)
nRBC: 0 % (ref 0.0–0.2)

## 2023-03-21 LAB — LIPASE, BLOOD: Lipase: 48 U/L (ref 11–51)

## 2023-03-21 MED ORDER — SODIUM CHLORIDE 0.9 % IV SOLN: INTRAVENOUS | Status: DC

## 2023-03-21 NOTE — ED Notes (Signed)
Light green and lavender lab collection not accepted d/t pt ID labels not applied. Labs will be redrawn. [employer]

## 2023-03-21 NOTE — ED Notes (Signed)
Pt aware of need for urine sample.  

## 2023-03-21 NOTE — ED Notes (Signed)
Labeled specimen cup given to pt for U/A collection per MD order. [employer] 

## 2023-03-21 NOTE — ED Triage Notes (Signed)
Pt reports upper abdominal pain that began today. Pt reports she had colostomy reversal and part of her colon removed on 6/26. Pt reports having bowel obstruction after surgery and was discharged last Friday. Pt reports intermittent diarrhea and constipation since Friday. Pt reports good PO intake and denies emesis or nausea.

## 2023-03-22 ENCOUNTER — Observation Stay (HOSPITAL_COMMUNITY): Payer: 59

## 2023-03-22 ENCOUNTER — Encounter (HOSPITAL_COMMUNITY): Payer: Self-pay | Admitting: General Surgery

## 2023-03-22 ENCOUNTER — Emergency Department (HOSPITAL_COMMUNITY): Payer: 59

## 2023-03-22 DIAGNOSIS — R52 Pain, unspecified: Secondary | ICD-10-CM | POA: Insufficient documentation

## 2023-03-22 DIAGNOSIS — R1010 Upper abdominal pain, unspecified: Secondary | ICD-10-CM | POA: Diagnosis not present

## 2023-03-22 DIAGNOSIS — K56609 Unspecified intestinal obstruction, unspecified as to partial versus complete obstruction: Secondary | ICD-10-CM | POA: Diagnosis not present

## 2023-03-22 DIAGNOSIS — K5750 Diverticulosis of both small and large intestine without perforation or abscess without bleeding: Secondary | ICD-10-CM | POA: Diagnosis not present

## 2023-03-22 LAB — URINALYSIS, ROUTINE W REFLEX MICROSCOPIC
Bacteria, UA: NONE SEEN
Bilirubin Urine: NEGATIVE
Glucose, UA: NEGATIVE mg/dL
Hgb urine dipstick: NEGATIVE
Ketones, ur: NEGATIVE mg/dL
Nitrite: NEGATIVE
Protein, ur: NEGATIVE mg/dL
Specific Gravity, Urine: 1.008 (ref 1.005–1.030)
pH: 6 (ref 5.0–8.0)

## 2023-03-22 MED ORDER — MENTHOL 3 MG MT LOZG: 1.0000 | LOZENGE | OROMUCOSAL | Status: DC | PRN

## 2023-03-22 MED ORDER — PHENOL 1.4 % MT LIQD: 2.0000 | OROMUCOSAL | Status: DC | PRN

## 2023-03-22 MED ORDER — BISACODYL 10 MG RE SUPP
10.0000 mg | Freq: Every day | RECTAL | Status: DC
  Administered 2023-03-22: 10 mg via RECTAL
  Filled 2023-03-22 (×2): qty 1

## 2023-03-22 MED ORDER — ONDANSETRON HCL 4 MG/2ML IJ SOLN
4.0000 mg | Freq: Three times a day (TID) | INTRAMUSCULAR | Status: AC | PRN
  Administered 2023-03-22: 4 mg via INTRAVENOUS
  Filled 2023-03-22: qty 2

## 2023-03-22 MED ORDER — METHOCARBAMOL 1000 MG/10ML IJ SOLN
1000.0000 mg | Freq: Four times a day (QID) | INTRAVENOUS | Status: DC | PRN
  Administered 2023-03-22 – 2023-03-23 (×3): 1000 mg via INTRAVENOUS
  Filled 2023-03-22 (×3): qty 1000

## 2023-03-22 MED ORDER — SODIUM CHLORIDE 0.9 % IV SOLN: 8.0000 mg | Freq: Four times a day (QID) | INTRAVENOUS | Status: DC | PRN

## 2023-03-22 MED ORDER — LACTATED RINGERS IV BOLUS
1000.0000 mL | Freq: Once | INTRAVENOUS | Status: AC
  Administered 2023-03-22: 1000 mL via INTRAVENOUS

## 2023-03-22 MED ORDER — ACETAMINOPHEN 650 MG RE SUPP: 650.0000 mg | Freq: Four times a day (QID) | RECTAL | Status: DC | PRN

## 2023-03-22 MED ORDER — MAGIC MOUTHWASH: 15.0000 mL | Freq: Four times a day (QID) | ORAL | Status: DC | PRN

## 2023-03-22 MED ORDER — METOPROLOL TARTRATE 5 MG/5ML IV SOLN: 5.0000 mg | Freq: Four times a day (QID) | INTRAVENOUS | Status: DC | PRN

## 2023-03-22 MED ORDER — SODIUM CHLORIDE (PF) 0.9 % IJ SOLN
INTRAMUSCULAR | Status: AC
  Filled 2023-03-22: qty 50

## 2023-03-22 MED ORDER — ALUM & MAG HYDROXIDE-SIMETH 200-200-20 MG/5ML PO SUSP: 30.0000 mL | Freq: Four times a day (QID) | ORAL | Status: DC | PRN

## 2023-03-22 MED ORDER — HYDROMORPHONE HCL 1 MG/ML IJ SOLN
0.5000 mg | INTRAMUSCULAR | Status: DC | PRN
  Administered 2023-03-22: 0.5 mg via INTRAVENOUS
  Filled 2023-03-22: qty 1

## 2023-03-22 MED ORDER — ACETAMINOPHEN 500 MG PO TABS
1000.0000 mg | ORAL_TABLET | Freq: Four times a day (QID) | ORAL | Status: DC
  Administered 2023-03-22 – 2023-03-23 (×5): 1000 mg via ORAL
  Filled 2023-03-22 (×4): qty 2

## 2023-03-22 MED ORDER — PROCHLORPERAZINE EDISYLATE 10 MG/2ML IJ SOLN: 5.0000 mg | INTRAMUSCULAR | Status: DC | PRN

## 2023-03-22 MED ORDER — ONDANSETRON HCL 4 MG/2ML IJ SOLN: 4.0000 mg | Freq: Four times a day (QID) | INTRAMUSCULAR | Status: DC | PRN

## 2023-03-22 MED ORDER — CALCIUM POLYCARBOPHIL 625 MG PO TABS
625.0000 mg | ORAL_TABLET | Freq: Two times a day (BID) | ORAL | Status: DC
  Administered 2023-03-22 – 2023-03-23 (×3): 625 mg via ORAL
  Filled 2023-03-22 (×3): qty 1

## 2023-03-22 MED ORDER — FENTANYL CITRATE PF 50 MCG/ML IJ SOSY
25.0000 ug | PREFILLED_SYRINGE | Freq: Once | INTRAMUSCULAR | Status: AC
  Administered 2023-03-22: 25 ug via INTRAVENOUS
  Filled 2023-03-22: qty 1

## 2023-03-22 MED ORDER — TRAMADOL HCL 50 MG PO TABS
50.0000 mg | ORAL_TABLET | Freq: Four times a day (QID) | ORAL | Status: DC | PRN
  Administered 2023-03-22 – 2023-03-23 (×3): 100 mg via ORAL
  Filled 2023-03-22 (×3): qty 2

## 2023-03-22 MED ORDER — DIATRIZOATE MEGLUMINE & SODIUM 66-10 % PO SOLN
90.0000 mL | Freq: Once | ORAL | Status: AC
  Administered 2023-03-22: 90 mL via NASOGASTRIC
  Filled 2023-03-22: qty 90

## 2023-03-22 MED ORDER — IOHEXOL 300 MG/ML  SOLN
100.0000 mL | Freq: Once | INTRAMUSCULAR | Status: AC | PRN
  Administered 2023-03-22: 100 mL via INTRAVENOUS

## 2023-03-22 MED ORDER — SIMETHICONE 40 MG/0.6ML PO SUSP: 80.0000 mg | Freq: Four times a day (QID) | ORAL | Status: DC | PRN

## 2023-03-22 MED ORDER — SODIUM CHLORIDE 0.9 % IV SOLN: INTRAVENOUS | Status: AC

## 2023-03-22 MED ORDER — ACETAMINOPHEN 325 MG PO TABS: 325.0000 mg | ORAL_TABLET | Freq: Four times a day (QID) | ORAL | Status: DC | PRN

## 2023-03-22 MED ORDER — LACTATED RINGERS IV BOLUS: 1000.0000 mL | Freq: Three times a day (TID) | INTRAVENOUS | Status: DC | PRN

## 2023-03-22 NOTE — H&P (Signed)
03/22/2023  Judith Miller [id number] [birth year]  CARE TEAM: PCP: [facility], [facility]  Outpatient Care Team: Patient Care Team: [facility] as PCP - General Fritzi Mandes, MD as Consulting Physician (General Surgery) Lanelle Bal, DO as Consulting Physician (Gastroenterology) Karie Soda, MD as Consulting Physician (Colon and Rectal Surgery)  Inpatient Treatment Team: Treatment Team:  Ong, Md, MD Eden Emms, RN [place], [place], NT Paudel, Bishnu D, RN Pham, Anh P, RPH Thayer Headings E, RN   Problem List:   Principal Problem:   SBO (small bowel obstruction) (HCC)   03/01/2023  POST-OPERATIVE DIAGNOSIS:   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  Very dense adhesions of small bowel to the retroperitoneum especially in the pelvis circumferentially.  Couple areas causing partial obstruction with stricturing especially at the distal ileum and mid ileum.  Ultimately had to resect section of small bowel with extremely dense stricture in enterotomy and thin serosal areas due to extremely dense adhesions.   Rectosigmoid resection done dense fibrotic inflammation with chronic white pigtail percutaneous drain resting in the left anterolateral pelvis near the proximal rectum and stricturing.  Phlegmon and inflammation but no active abscess.  No obvious metastatic disease on visceral parietal peritoneum  or liver.   It is a 29mm EEA anastomosis ( distal descending colon  connected to mid rectum.)  It rests 9 cm from the anal verge by rigid proctoscopy.   CASE DATA: Type of patient?: Elective [facility] Private Case Status of Case? Elective Scheduled Infection Present At Time Of Surgery (PATOS)?  PHLEGMON  PATHOLOGY  FINAL MICROSCOPIC DIAGNOSIS:  A. APPENDIX: - Benign appendix  B. LOOP, TRANSVERSE COLOSTOMY: - Portion of colon with benign skin, consistent with colostomy site - No malignancy identified  C. ILEUM, RESECTION: - Portion of ileum with serosal adhesions - Two benign lymph nodes - No malignancy identified  D. RECTOSIGMOID COLON, RESECTION: - Portion of colon with diverticulosis and serosal adhesions - Margins are viable - Two benign lymph nodes - No malignancy identified  E. FINAL DISTAL MARGIN, EXCISION: - Benign colonic mucosa with mild hyperplastic change - No malignancy identified  Assessment [facility] Stay = 0 days)      Admitted to by emergency department most likely due to getting behind on pain medications.  CT scan stable to my view.  Plan:  Will do small bowel protocol just in case.  Ideally NG tube but she refuses.  Try clear liquids.  Pain control.  Maximize nonnarcotic pain control.  Patient prefers IV Dilaudid but will try to discourage her from this.  IV fluids with as needed bolus backup   Follow and correct electrolytes.  Hypokalemia -more stable.  Continue supplementation orally  Repeat IV replacement for now.  Magnesium OK, check phos  No evidence of any renal failure.  No evidence of any wound infection abscess leak or perforation.  No need for antibiotics.  Pain control.  Placed on standing ketorolac and methocarbamol to minimize narcotics.  Ice and heat.  Anxiety-lorazepam as needed.  Bipolar disorder.  Stable for now.  Monitor.  -VTE prophylaxis- SCDs, enoxaparin etc  -mobilize as tolerated to help recovery  -Disposition:   Disposition:  The patient is from: Home Anticipate discharge to:  Home Anticipated Date of Discharge is:  July 13,2024   Barriers to discharge:  Pending Clinical improvement (more likely than not)  Patient currently is NOT MEDICALLY STABLE for discharge from the hospital from a surgery standpoint.      I reviewed nursing notes, last 24 h vitals and pain scores, last 48 h intake and output, last 24 h labs and trends, and last 24 h imaging results.  I have reviewed this patient's available data, including medical history, events of note, test results, etc as part of my evaluation.   A significant portion of that time was spent in counseling. Care during the described time interval was provided by me.  This care required moderate level of medical decision making.  03/22/2023    Subjective: (Chief complaint) Patient notes that she avoided narcotics yesterday were running errands and felt pain.  Came to the ER.  Was feeling a little constipated but since then has been moving her bowels and passing gas.  Tolerating most foods without any nausea or vomiting.  Notes soreness at her old diverting colostomy site.  Patient denies any sick contacts or travel history.  Complicated past medical and surgical history as noted on prior notes with robotic low anterior rectosigmoid resection with small bowel resection appendectomy and takedown of diverting loop colostomy for benign sigmoid stricture complicated with anxiety, bipolar disorder, narcotic dependence/preference.  Objective:  Vital signs:  Vitals:   03/21/23 2101 03/22/23 0008 03/22/23 0256 03/22/23 0539  BP:  126/69 109/63 93/60  Pulse:  79 72 63  Resp:  18 17 15   Temp: 98 F (36.7 C) (!) 97.3 F (36.3 C) (!) 97.5 F (36.4 C) 97.6 F (36.4 C)  TempSrc:  Oral Oral Oral  SpO2:  97% 97% 94%  Weight: 74.4 kg     Height: 5\' 6"  (1.676 m)       Last BM Date : 03/22/23  Intake/Output   Yesterday:  07/16 0701 - 07/17 0700 In:  567.9 [I.V.:567.9] Out: 0  This shift:  No intake/output data recorded.  Bowel function:  Flatus: YES  BM:  YES  NGT:  Thin bilious effluent - scant.  Flushes well.  Filter clean.   Physical Exam:  General: Pt awake/alert in no acute distress.  Not toxic.  Not sickly. Eyes: PERRL, normal EOM.  Sclera clear.  No icterus Neuro: CN II-XII intact w/o focal sensory/motor deficits. Lymph: No head/neck/groin lymphadenopathy Psych:  No delerium/psychosis/paranoia.  Remains somewhat anxious but mostly consolable.  Again interrupts often but is mostly able to be redirectable and mostly reassured.  Oriented x 4 HENT: Normocephalic, Mucus membranes moist.  No thrush Neck: Supple, No tracheal deviation.  No obvious thyromegaly Chest: No pain to chest wall compression.  Good respiratory excursion.  No audible wheezing CV:  Pulses intact.  Regular rhythm.  No major extremity edema MS: Normal AROM mjr joints.  No obvious deformity  Abdomen: Soft.  Nondistended.  Nontender.  Incisions clean dry intact.  Old colostomy site with normal healing ridge.  No evidence of any cellulitis abscess or drainage.  No evidence of peritonitis.  No incarcerated hernias.  Ext:   No deformity.  No mjr edema.  No cyanosis Skin: No petechiae / purpurea.  No major sores.  Warm and dry  Results:   SURGICAL PATHOLOGY  CASE: [id number] PATIENT: Nyra Capes Surgical Pathology Report  Clinical History: sigmoid stricture with chronic fistula due to diverticulitis  FINAL MICROSCOPIC DIAGNOSIS:  A. APPENDIX: - Benign appendix  B. LOOP, TRANSVERSE COLOSTOMY: - Portion of colon with benign skin, consistent with colostomy site - No malignancy identified  C. ILEUM, RESECTION: - Portion of ileum with serosal adhesions - Two benign lymph nodes - No malignancy identified  D. RECTOSIGMOID COLON, RESECTION: - Portion of colon with diverticulosis and serosal adhesions - Margins are viable - Two benign  lymph nodes - No malignancy identified  E. FINAL DISTAL MARGIN, EXCISION: - Benign colonic mucosa with mild hyperplastic change - No malignancy identified  Aditri Louischarles DESCRIPTION:  A.  Specimen: Received fresh is an appendix with an open end margin that is over inked black. Size: 6.0 cm in length by 0.7 cm in diameter Serosa: Pink-tan, smooth, and intact Mucosa: Pink-tan and glistening without distinct lesions Wall: The wall has a thickness of 0.3 cm Lumen: Pinpoint, containing a minimal amount of watery fecal material. No fecaliths are grossly identified. Block Summary: The appendix margin, a representative cross-section, and a perpendicular section of the appendiceal tip are submitted in 1 block.  B.  Received fresh is "loop/transverse colostomy".  The specimen consists of a 3.5 cm in length by 2.1 cm in diameter portion of bowel with a thin rim of tan, wrinkled skin at 1 end.  The mucosa is tan and glistening without distinct lesions.  The serosa is minimally roughened. The wall has a thickness of 0.7 cm.  A representative section including a portion of the skin is submitted in 1 block.  C.  Received fresh is "ileum" the specimen consists of a 33 cm in length by up to 1.6 cm in diameter portion of bowel.  The serosa is tan with minimal amounts of gray adhesion.  The bowel contains a minimal amount of digestive material.  The mucosa is tan and glistening without distinct lesions and a wall thickness of 0.5 cm.  The attached fat is palpated and 2 lymph node candidates are identified and sampled measuring 0.1 and 0.9 cm. Representative sections are submitted as follows: C1-C2 margins from opposite sides C3-C4 central bowel with serosal adhesions C5 two lymph node candidates, whole  D. Specimen: Received fresh is "rectosigmoid colon open end proximal" Length: 13.5 cm in length by 2.0 cm in diameter Serosa: Pink-tan and roughened with multiple gray adhesions Contents: Ample  amounts of fecal material Mucosa/Wall: The mucosa is tan and glistening with a wall thickness of 0.7 cm.  There are multiple diverticula ranging from 0.2 to 1.5 cm.  No further distinct lesions are grossly identified Lymph nodes: The pericolic fat is palpated revealing 2 lymph node candidates measuring 0.5 and 0.6 cm that are sampled. Block Summary: D1 proximal margin D2 distal margin D3 Central bowel with adhesion D4 diverticulum D5 two lymph node candidates, whole  E. Received fresh is "final distal margin" consisting of a 2.5 cm in diameter by 0.5 cm in length ring of tan, glistening mucosa without distinct lesions.  The final margin is submitted in 1 block. (KW, 03/02/2023)   Final Diagnosis performed by Clifton James, MD.   Electronically signed 03/06/2023 Technical and / or Professional components performed at [facility], [address] [place], [place] [address].  Immunohistochemistry Technical component (if applicable) was performed at [facility]. [address], [place], [place] [address].   IMMUNOHISTOCHEMISTRY DISCLAIMER (  if applicable): Some of these immunohistochemical stains may have been developed and the performance characteristics determine by [facility]. Some may not have been cleared or approved by the U.S. Food and Drug Administration. The FDA has determined that such clearance or approval is not necessary. This test is used for clinical purposes. It should not be regarded as investigational or for research. This laboratory is certified under the Clinical Laboratory Improvement Amendments of 1988 (CLIA-88) as qualified to perform high complexity clinical laboratory testing.  The controls stained appropriately.   IHC stains are performed on formalin fixed, paraffin embedded tissue using a 3,3"diaminobenzidine (DAB) chromogen and Leica Bond Autostainer System. The staining intensity of the nucleus is  score manually and is reported as the percentage of tumor cell nuclei demonstrating specific nuclear staining. The specimens are fixed in 10% Neutral Formalin for at least 6 hours and up to 72hrs. These tests are validated on decalcified tissue. Results should be interpreted with caution given the possibility of false negative results on decalcified specimens. Antibody Clones are as follows ER-clone [id number], PR-clone 16, Ki67- clone MM1. Some of these immunohistochemical stains may have been developed and the performance characteristics determined by [facility].   Cultures: No results found for this or any previous visit (from the past 720 hour(s)).  Labs: Results for orders placed or performed during the hospital encounter of 03/21/23 (from the past 48 hour(s))  Lipase, blood     Status: None   Collection Time: 03/21/23  9:18 PM  Result Value Ref Range   Lipase 48 11 - 51 U/L    Comment: Performed at [facility], [address] [place], [place] [address]  Comprehensive metabolic panel     Status: Abnormal   Collection Time: 03/21/23  9:18 PM  Result Value Ref Range   Sodium 135 135 - 145 mmol/L   Potassium 3.6 3.5 - 5.1 mmol/L   Chloride 97 (L) 98 - 111 mmol/L   CO2 28 22 - 32 mmol/L   Glucose, Bld 98 70 - 99 mg/dL    Comment: Glucose reference range applies only to samples taken after fasting for at least 8 hours.   BUN 11 8 - 23 mg/dL   Creatinine, Ser 6.04 0.44 - 1.00 mg/dL   Calcium 54.0 8.9 - 98.1 mg/dL   Total Protein 8.2 (H) 6.5 - 8.1 g/dL   Albumin 4.2 3.5 - 5.0 g/dL   AST 18 15 - 41 U/L   ALT 19 0 - 44 U/L   Alkaline Phosphatase 57 38 - 126 U/L   Total Bilirubin 0.4 0.3 - 1.2 mg/dL   GFR, Estimated >19 >14 mL/min    Comment: (NOTE) Calculated using the CKD-EPI Creatinine Equation (2021)    Anion gap 10 5 - 15    Comment: Performed at [facility], [address] [place], [place] [address]  CBC     Status: Abnormal    Collection Time: 03/21/23  9:18 PM  Result Value Ref Range   WBC 10.3 4.0 - 10.5 K/uL   RBC 4.40 3.87 - 5.11 MIL/uL   Hemoglobin 13.1 12.0 - 15.0 g/dL   HCT 62.1 30.8 - 65.7 %   MCV 92.7 80.0 - 100.0 fL   MCH 29.8 26.0 - 34.0 pg   MCHC 32.1 30.0 - 36.0 g/dL   RDW 84.6 96.2 - 95.2 %   Platelets 406 (H) 150 - 400 K/uL   nRBC 0.0 0.0 - 0.2 %  Comment: Performed at [facility], [address] [place], [place] [address]  Urinalysis, Routine w reflex microscopic -Urine, Clean Catch     Status: Abnormal   Collection Time: 03/22/23 12:09 AM  Result Value Ref Range   Color, Urine YELLOW YELLOW   APPearance CLEAR CLEAR   Specific Gravity, Urine 1.008 1.005 - 1.030   pH 6.0 5.0 - 8.0   Glucose, UA NEGATIVE NEGATIVE mg/dL   Hgb urine dipstick NEGATIVE NEGATIVE   Bilirubin Urine NEGATIVE NEGATIVE   Ketones, ur NEGATIVE NEGATIVE mg/dL   Protein, ur NEGATIVE NEGATIVE mg/dL   Nitrite NEGATIVE NEGATIVE   Leukocytes,Ua TRACE (A) NEGATIVE   RBC / HPF 0-5 0 - 5 RBC/hpf   WBC, UA 0-5 0 - 5 WBC/hpf   Bacteria, UA NONE SEEN NONE SEEN   Squamous Epithelial / HPF 0-5 0 - 5 /HPF   Mucus PRESENT     Comment: Performed at [facility], [address], [place], [place] [address]    Imaging / Studies: CT ABDOMEN PELVIS W CONTRAST  Result Date: 03/22/2023 CLINICAL DATA:  Upper abdominal pain for 1 day. Patient had colostomy reversal and partial colectomy 03/01/2023. Small-bowel obstruction after surgery. EXAM: CT ABDOMEN AND PELVIS WITH CONTRAST TECHNIQUE: Multidetector CT imaging of the abdomen and pelvis was performed using the standard protocol following bolus administration of intravenous contrast. RADIATION DOSE REDUCTION: This exam was performed according to the departmental dose-optimization program which includes automated exposure control, adjustment of the mA and/or kV according to patient size and/or use of iterative reconstruction technique. CONTRAST:   OMNIPAQUE IOHEXOL 300 MG/ML  SOLN COMPARISON:  Radiographs 03/14/2023 and CT abdomen and pelvis 03/11/2023 FINDINGS: Lower chest: Bibasilar scarring.  No acute abnormality. Hepatobiliary: No acute abnormality. Pancreas: Unremarkable. Spleen: Unremarkable. Adrenals/Urinary Tract: Stable adrenal glands. No urinary calculi or hydronephrosis. Unremarkable bladder. Stomach/Bowel: Stomach is within normal limits. Unchanged duodenal diverticulum. Postoperative changes about the small bowel and colon with multiple anastomoses. Dilated fluid-filled loops of small bowel in the left hemiabdomen are grossly similar to CT 03/11/2023. There is similar tapering to an area of decompressed small bowel (circa series 2/43). This is similar to prior. The colon is normal in caliber. Colonic diverticulosis without diverticulitis. Vascular/Lymphatic: Aortic atherosclerosis. No enlarged abdominal or pelvic lymph nodes. Reproductive: Uterus and bilateral adnexa are unremarkable. Other: No free intraperitoneal fluid or air. Musculoskeletal: No acute osseous abnormality. Mild stranding and soft tissue thickening in the left anterior paramidline abdomen, likely due to postoperative change. IMPRESSION: 1. Dilated fluid-filled loops of small bowel in the left hemiabdomen are grossly similar to CT 03/11/2023. There is similar tapering to an area of decompressed small bowel. Findings are concerning for partial small bowel obstruction versus ileus. Aortic Atherosclerosis (ICD10-I70.0). Electronically Signed   By: Minerva Fester M.D.   On: 03/22/2023 01:25    Medications / Allergies: per chart  Antibiotics: Anti-infectives (From admission, onward)    None         Note: Portions of this report may have been transcribed using voice recognition software. Every effort was made to ensure accuracy; however, inadvertent computerized transcription errors may be present.   Any transcriptional errors that result from this process are  unintentional.    Ardeth Sportsman, MD, FACS, MASCRS Esophageal, Gastrointestinal & Colorectal Surgery Robotic and Minimally Invasive Surgery  [facility] [address], [address] [place], [place] [address] [phone] Fax [phone] Main  CONTACT INFORMATION:  Weekday (9AM-5PM): Call [facility] main office at [phone]  Weeknight (5PM-9AM) or Weekend/Holiday: Check [email or web address] (password " [email or web address]") for General Surgery [facility] coverage  (Please, do not use SecureChat as it is not reliable communication to reach operating surgeons for immediate patient care given surgeries/outpatient duties/clinic/cross-coverage/off post-call which would lead to a delay in care.  Epic staff messaging available for outptient concerns, but may not be answered for 48 hours or more).     03/22/2023  7:28 AM

## 2023-03-22 NOTE — ED Notes (Signed)
Adjusted pulse oximeter to pt ear.

## 2023-03-22 NOTE — Plan of Care (Signed)
  Problem: Education: Goal: Understanding of discharge needs will improve Outcome: Progressing   Problem: Activity: Goal: Ability to tolerate increased activity will improve Outcome: Progressing   Problem: Bowel/Gastric: Goal: Gastrointestinal status for postoperative course will improve Outcome: Progressing   Problem: Nutritional: Goal: Will attain and maintain optimal nutritional status will improve Outcome: Progressing   Problem: Health Behavior/Discharge Planning: Goal: Ability to manage health-related needs will improve Outcome: Progressing

## 2023-03-22 NOTE — Progress Notes (Signed)
Mobility Specialist - Progress Note   03/22/23 1006  Mobility  Activity Ambulated independently in hallway  Level of Assistance Independent  Assistive Device None  Distance Ambulated (ft) 500 ft  Activity Response Tolerated well  Mobility Referral Yes  $Mobility charge 1 Mobility  Mobility Specialist Stop Time (ACUTE ONLY) 1005   Pt received in bed and agreeable to mobility. No complaints during session. Pt to bed after session with all needs met.    [facility]

## 2023-03-22 NOTE — ED Provider Notes (Signed)
[facility] EAST GENERAL SURGERY Provider Note   CSN: [id number] Arrival date & time: 03/21/23  2054     History  Chief Complaint  Patient presents with   Abdominal Pain   Constipation    Judith Miller is a 62 y.o. female.  The history is provided by the patient.  Abdominal Pain Pain location:  Epigastric Pain radiation: generalized. Pain severity:  Severe Onset quality:  Gradual Timing:  Constant Progression:  Worsening Chronicity:  Recurrent Context: recent illness   Context: not trauma   Relieved by:  Nothing Worsened by:  Nothing Ineffective treatments: home pain medication. 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 Authorizing Provider  acetaminophen (TYLENOL) 500 MG tablet Take 500 mg by mouth every 6 (six) hours as needed for mild pain or moderate pain.   Yes [provider]  Cholecalciferol (VITAMIN D3) 10 MCG (400 UNIT) tablet Take 400 Units by mouth daily.   Yes [provider]  Coenzyme Q10 (CO Q-10) 100 MG CAPS Take 100 mg by mouth 2 (two) times daily.   Yes [provider]  COLLAGEN PO Take 3 g by mouth daily.   Yes [provider]  Cyanocobalamin (VITAMIN B-12 PO) Take 2,500 mcg by mouth daily.    Yes [provider]  levocetirizine (XYZAL) 5 MG tablet Take 5 mg by mouth every evening. 12/08/22  Yes [provider]  methocarbamol (ROBAXIN) 500 MG tablet Take 1 tablet (500 mg total) by mouth every 8 (eight) hours as needed for muscle spasms. 03/04/23  Yes Karie Soda, MD  Misc Natural Products (BEET ROOT PO) Take 1,000 mg by mouth daily.   Yes [provider]  Multiple Vitamin (MULTIVITAMIN) tablet Take 1 tablet by mouth daily.   Yes [provider]  OVER THE COUNTER MEDICATION Take 1 tablet by mouth 2 (two) times daily. Medication: Brain & Memory Performance   Yes [provider]  oxyCODONE (OXY IR/ROXICODONE) 5 MG immediate release tablet Take 1-2 tablets (5-10 mg total) by mouth every 6 (six) hours as needed for moderate pain, severe pain or breakthrough pain. 03/04/23  Yes Karie Soda, MD  Potassium 99 MG TABS Take 99 mg by mouth daily.   Yes [provider]  sodium chloride flush 0.9 % SOLN injection Flush with 10 mLs by Intracatheter route daily. 08/19/22  Yes Fritzi Mandes, MD  Turmeric 500 MG CAPS Take 500 mg by mouth daily.   Yes [provider]  vitamin E 180 MG (400 UNITS) capsule Take 400 Units by mouth daily.   Yes [provider]  Magnesium 250 MG TABS Take 250 mg by mouth in the morning.    [provider]  Sodium  Chloride Flush (NORMAL SALINE FLUSH) 0.9 % SOLN Inject 10 MLs by intracatheter daily Patient not taking: Reported on 02/15/2023 03/31/22         Allergies    Aripiprazole and Sulfa antibiotics    Review of Systems   Review of Systems  Constitutional:  Negative for fever.  Gastrointestinal:  Positive for abdominal pain and constipation.  All other systems reviewed and are negative.   Physical Exam Updated Vital Signs BP 109/63 (BP Location: Left Arm)   Pulse 72   Temp (!) 97.5 F (36.4 C) (Oral)   Resp 17   Ht 5\' 6"  (1.676 m)   Wt 74.4 kg   SpO2 97%   BMI 26.47 kg/m   Physical Exam Vitals and nursing note reviewed.  Constitutional:      General: She is not in acute distress.    Appearance: Normal appearance. She is well-developed.  HENT:     Head: Normocephalic and atraumatic.     Nose: Nose normal.  Eyes:     Pupils: Pupils are equal, round, and reactive to light.  Cardiovascular:     Rate and Rhythm: Normal rate and regular rhythm.     Pulses: Normal pulses.     Heart sounds: Normal heart sounds.  Pulmonary:     Effort: Pulmonary effort is normal. No respiratory distress.     Breath sounds: Normal breath sounds.  Abdominal:     General: There is distension.     Palpations: Abdomen is soft.     Tenderness: There is abdominal tenderness. There is no guarding or rebound.     Comments: Tinkling bowel sounds   Genitourinary:    Vagina: No vaginal discharge.  Musculoskeletal:        General: Normal range of motion.     Cervical back: Neck supple.  Skin:    General: Skin is dry.     Capillary Refill: Capillary refill takes less than 2 seconds.     Findings: No erythema or rash.  Neurological:     General: No focal deficit present.     Mental Status: She is alert.     Deep Tendon Reflexes: Reflexes normal.  Psychiatric:        Mood and Affect: Mood normal.     ED Results / Procedures / Treatments   Labs (all labs ordered are listed, but only abnormal results are displayed) Results for orders placed or performed during the hospital encounter of 03/21/23  Lipase, blood  Result Value Ref Range   Lipase 48 11 - 51 U/L  Comprehensive metabolic panel  Result Value Ref Range   Sodium 135 135 - 145 mmol/L   Potassium 3.6 3.5 - 5.1 mmol/L   Chloride 97 (L) 98 - 111 mmol/L   CO2 28 22 - 32 mmol/L   Glucose, Bld 98 70 - 99 mg/dL   BUN 11 8 - 23 mg/dL   Creatinine, Ser 1.61 0.44 - 1.00 mg/dL   Calcium 09.6 8.9 - 04.5 mg/dL   Total Protein 8.2 (H) 6.5 - 8.1 g/dL   Albumin 4.2 3.5 - 5.0 g/dL   AST 18 15 - 41 U/L   ALT 19 0 - 44 U/L    Alkaline Phosphatase 57 38 - 126 U/L   Total Bilirubin 0.4 0.3 - 1.2 mg/dL   GFR, Estimated >40 >98 mL/min   Anion gap 10 5 - 15  CBC  Result Value Ref Range   WBC 10.3 4.0 - 10.5 K/uL   RBC  4.40 3.87 - 5.11 MIL/uL   Hemoglobin 13.1 12.0 - 15.0 g/dL   HCT 82.9 56.2 - 13.0 %   MCV 92.7 80.0 - 100.0 fL   MCH 29.8 26.0 - 34.0 pg   MCHC 32.1 30.0 - 36.0 g/dL   RDW 86.5 78.4 - 69.6 %   Platelets 406 (H) 150 - 400 K/uL   nRBC 0.0 0.0 - 0.2 %  Urinalysis, Routine w reflex microscopic -Urine, Clean Catch  Result Value Ref Range   Color, Urine YELLOW YELLOW   APPearance CLEAR CLEAR   Specific Gravity, Urine 1.008 1.005 - 1.030   pH 6.0 5.0 - 8.0   Glucose, UA NEGATIVE NEGATIVE mg/dL   Hgb urine dipstick NEGATIVE NEGATIVE   Bilirubin Urine NEGATIVE NEGATIVE   Ketones, ur NEGATIVE NEGATIVE mg/dL   Protein, ur NEGATIVE NEGATIVE mg/dL   Nitrite NEGATIVE NEGATIVE   Leukocytes,Ua TRACE (A) NEGATIVE   RBC / HPF 0-5 0 - 5 RBC/hpf   WBC, UA 0-5 0 - 5 WBC/hpf   Bacteria, UA NONE SEEN NONE SEEN   Squamous Epithelial / HPF 0-5 0 - 5 /HPF   Mucus PRESENT    CT ABDOMEN PELVIS W CONTRAST  Result Date: 03/22/2023 CLINICAL DATA:  Upper abdominal pain for 1 day. Patient had colostomy reversal and partial colectomy 03/01/2023. Small-bowel obstruction after surgery. EXAM: CT ABDOMEN AND PELVIS WITH CONTRAST TECHNIQUE: Multidetector CT imaging of the abdomen and pelvis was performed using the standard protocol following bolus administration of intravenous contrast. RADIATION DOSE REDUCTION: This exam was performed according to the departmental dose-optimization program which includes automated exposure control, adjustment of the mA and/or kV according to patient size and/or use of iterative reconstruction technique. CONTRAST:  OMNIPAQUE IOHEXOL 300 MG/ML  SOLN COMPARISON:  Radiographs 03/14/2023 and CT abdomen and pelvis 03/11/2023 FINDINGS: Lower chest: Bibasilar scarring.  No acute abnormality.  Hepatobiliary: No acute abnormality. Pancreas: Unremarkable. Spleen: Unremarkable. Adrenals/Urinary Tract: Stable adrenal glands. No urinary calculi or hydronephrosis. Unremarkable bladder. Stomach/Bowel: Stomach is within normal limits. Unchanged duodenal diverticulum. Postoperative changes about the small bowel and colon with multiple anastomoses. Dilated fluid-filled loops of small bowel in the left hemiabdomen are grossly similar to CT 03/11/2023. There is similar tapering to an area of decompressed small bowel (circa series 2/43). This is similar to prior. The colon is normal in caliber. Colonic diverticulosis without diverticulitis. Vascular/Lymphatic: Aortic atherosclerosis. No enlarged abdominal or pelvic lymph nodes. Reproductive: Uterus and bilateral adnexa are unremarkable. Other: No free intraperitoneal fluid or air. Musculoskeletal: No acute osseous abnormality. Mild stranding and soft tissue thickening in the left anterior paramidline abdomen, likely due to postoperative change. IMPRESSION: 1. Dilated fluid-filled loops of small bowel in the left hemiabdomen are grossly similar to CT 03/11/2023. There is similar tapering to an area of decompressed small bowel. Findings are concerning for partial small bowel obstruction versus ileus. Aortic Atherosclerosis (ICD10-I70.0). Electronically Signed   By: Minerva Fester M.D.   On: 03/22/2023 01:25   DG Abd Portable 1V-Small Bowel Obstruction Protocol-initial, 8 hr delay  Result Date: 03/14/2023 CLINICAL DATA:  Small-bowel protocol, 8 hours post contrast. EXAM: PORTABLE ABDOMEN - 1 VIEW COMPARISON:  Abdominal x-ray 03/13/2023 FINDINGS: Contrast is seen within the right colon and rectum, new from prior. Dilated small bowel loops persist measuring up to 4.7 cm. Enteric tube tip is in the gastric body, unchanged. IMPRESSION: 1. Contrast is seen within the right colon and rectum. 2. Dilated small bowel loops persist measuring up to 4.7 cm. Findings  are favored  as ileus or partial small bowel obstruction. Electronically Signed   By: Darliss Cheney M.D.   On: 03/14/2023 02:06   DG Abd Portable 1V  Result Date: 03/13/2023 CLINICAL DATA:  Ileus EXAM: PORTABLE ABDOMEN - 1 VIEW COMPARISON:  None Available. FINDINGS: NG tube in stomach. Persistent dilated loops of small bowel LEFT abdomen measure up to 4.9 cm compared to 5.4 cm. There is gas in the rectum. No intraperitoneal free air on supine exam. IMPRESSION: 1. Persistent dilated loops of small bowel in the LEFT abdomen. 2. Gas in the rectum. 3. NG tube in stomach. Electronically Signed   By: Genevive Bi M.D.   On: 03/13/2023 08:11   DG Abd Portable 1V  Result Date: 03/12/2023 CLINICAL DATA:  Evaluate small bowel obstruction. EXAM: PORTABLE ABDOMEN - 1 VIEW COMPARISON:  03/11/23 FINDINGS: NG tube is identified within the left upper quadrant of the abdomen with tip and side port below the GE junction within the stomach. Persistent dilated small bowel loops within the left upper quadrant and left lower quadrant of the abdomen measuring up to 5 cm in diameter. Paucity the of colonic gas. IMPRESSION: 1. Persistent abnormally dilated loops of small bowel in the left hemiabdomen compatible with either small bowel ileus or obstruction. Electronically Signed   By: Signa Kell M.D.   On: 03/12/2023 10:19   DG Abd Portable 1V  Result Date: 03/11/2023 CLINICAL DATA:  NG tube placement EXAM: PORTABLE ABDOMEN - 1 VIEW COMPARISON:  None Available. FINDINGS: Tip of NG tube is seen in the stomach in the medial aspect of the fundus. There are dilated small bowel loops in left upper abdomen. Transverse diameter of heart is increased. There are no signs of pulmonary edema or focal pulmonary consolidation. IMPRESSION: To both NG tube is seen in the stomach. There is dilation of small bowel loops suggesting ileus or partial obstruction. Electronically Signed   By: Ernie Avena M.D.   On: 03/11/2023 17:32   DG C-Arm 1-60  Min-No Report  Result Date: 03/01/2023 Fluoroscopy was utilized by the requesting physician.  No radiographic interpretation.    Radiology CT ABDOMEN PELVIS W CONTRAST  Result Date: 03/22/2023 CLINICAL DATA:  Upper abdominal pain for 1 day. Patient had colostomy reversal and partial colectomy 03/01/2023. Small-bowel obstruction after surgery. EXAM: CT ABDOMEN AND PELVIS WITH CONTRAST TECHNIQUE: Multidetector CT imaging of the abdomen and pelvis was performed using the standard protocol following bolus administration of intravenous contrast. RADIATION DOSE REDUCTION: This exam was performed according to the departmental dose-optimization program which includes automated exposure control, adjustment of the mA and/or kV according to patient size and/or use of iterative reconstruction technique. CONTRAST:  OMNIPAQUE IOHEXOL 300 MG/ML  SOLN COMPARISON:  Radiographs 03/14/2023 and CT abdomen and pelvis 03/11/2023 FINDINGS: Lower chest: Bibasilar scarring.  No acute abnormality. Hepatobiliary: No acute abnormality. Pancreas: Unremarkable. Spleen: Unremarkable. Adrenals/Urinary Tract: Stable adrenal glands. No urinary calculi or hydronephrosis. Unremarkable bladder. Stomach/Bowel: Stomach is within normal limits. Unchanged duodenal diverticulum. Postoperative changes about the small bowel and colon with multiple anastomoses. Dilated fluid-filled loops of small bowel in the left hemiabdomen are grossly similar to CT 03/11/2023. There is similar tapering to an area of decompressed small bowel (circa series 2/43). This is similar to prior. The colon is normal in caliber. Colonic diverticulosis without diverticulitis. Vascular/Lymphatic: Aortic atherosclerosis. No enlarged abdominal or pelvic lymph nodes. Reproductive: Uterus and bilateral adnexa are unremarkable. Other: No free intraperitoneal fluid or air. Musculoskeletal: No acute osseous  abnormality. Mild stranding and soft tissue thickening in the left  anterior paramidline abdomen, likely due to postoperative change. IMPRESSION: 1. Dilated fluid-filled loops of small bowel in the left hemiabdomen are grossly similar to CT 03/11/2023. There is similar tapering to an area of decompressed small bowel. Findings are concerning for partial small bowel obstruction versus ileus. Aortic Atherosclerosis (ICD10-I70.0). Electronically Signed   By: Minerva Fester M.D.   On: 03/22/2023 01:25    Procedures Procedures    Medications Ordered in ED Medications  0.9 %  sodium chloride infusion (0 mLs Intravenous Stopped 03/22/23 0242)  0.9 %  sodium chloride infusion ( Intravenous New Bag/Given 03/22/23 0308)  ondansetron (ZOFRAN) injection 4 mg (4 mg Intravenous Given 03/22/23 0225)  HYDROmorphone (DILAUDID) injection 0.5 mg (0.5 mg Intravenous Given 03/22/23 0227)  iohexol (OMNIPAQUE) 300 MG/ML solution 100 mL (100 mLs Intravenous Contrast Given 03/22/23 0024)  fentaNYL (SUBLIMAZE) injection 25 mcg (25 mcg Intravenous Given 03/22/23 0101)    ED Course/ Medical Decision Making/ A&P                             Medical Decision Making Patient with abdominal pain and constipation post op with concern for repeat sbo   Amount and/or Complexity of Data Reviewed Independent Historian:     Details: Family member see above  External Data Reviewed: labs and notes.    Details: Previous inpatient notes and labs reviewed  Labs: ordered.    Details: Normal lipase 48, normal sodium 135 normal potassium 3.6, normal creatinine .63 normal LFTs.  White count is 10.3 normal, hemogloboin 13.1 normal platelets elevated 406.  Normal urine  Radiology: ordered.    Details: SBO by me on CT Discussion of management or test interpretation with external provider(s): Case d/w Dr. Derrell Lolling admit to [facility], please place temp orders.  Will message [facility] chat group as well.    Chat to [facility] surgery has been completeled   Risk Prescription drug management. Decision regarding  hospitalization. Risk Details: Will admit for SBO as patient is not vomiting will not place NG as patient states she must be "put out" for this    Final Clinical Impression(s) / ED Diagnoses Final diagnoses:  SBO (small bowel obstruction) (HCC)   The patient appears reasonably stabilized for admission considering the current resources, flow, and capabilities available in the ED at this time, and I doubt any other [facility] requiring further screening and/or treatment in the ED prior to admission.  Rx / DC Orders ED Discharge Orders     None         Lenzy Kerschner, MD 03/22/23 [phone]

## 2023-03-22 NOTE — ED Notes (Signed)
Verbal order received by Dr. Sherril Cong for 25 mcg Fentanyl IV. verbal read back verified

## 2023-03-22 NOTE — ED Notes (Signed)
..ED TO INPATIENT HANDOFF REPORT  Name/Age/Gender Judith Miller 62 y.o. female  Code Status Code Status History     Date Active Date Inactive Code Status Order ID Comments User Context   03/11/2023 1312 03/17/2023 1733 Full Code [id number]  Berna Bue, MD Inpatient   03/01/2023 1920 03/04/2023 1644 Full Code [id number]  Karie Soda, MD Inpatient   03/23/2022 1714 03/25/2022 1603 Full Code [id number]  Fritzi Mandes, MD Inpatient   11/27/2021 2203 12/28/2021 1941 Full Code [id number]  Fritzi Mandes, MD Inpatient    Questions for Most Recent Historical Code Status (Order [id number])     Question Answer   By: Other            Home/SNF/Other Home  Chief Complaint SBO (small bowel obstruction) (HCC) [K56.609]  Level of Care/Admitting Diagnosis ED Disposition     ED Disposition  Admit   Condition  --   Comment  Hospital Area: [facility] [[id number]]  Level of Care: Med-Surg [16]  May place patient in observation at [facility] or [facility] if equivalent level of care is available:: No  Covid Evaluation: Asymptomatic - no recent exposure (last 10 days) testing not required  Diagnosis: SBO (small bowel obstruction) [facility]) [[id number]]  Admitting Physician: Axel Filler [phone]  Attending Physician: [facility], MD [[id number]]          Medical History Past Medical History:  Diagnosis Date   Arthritis    Bipolar disorder (HCC)    Colonic diverticular abscess 03/01/2023   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   Deviated septum    Fistula of intestine    Headache    sinus   Pelvic abscess in female 03/23/2022   Perforation of sigmoid colon (HCC) 11/27/2021    Allergies Allergies  Allergen Reactions   Aripiprazole     Hallucinations    Sulfa Antibiotics Other (See Comments)    Headaches     IV Location/Drains/Wounds Patient Lines/Drains/Airways Status     Active Line/Drains/Airways     Name Placement date Placement time Site Days   Peripheral IV  03/22/23 20 G Right Antecubital 03/22/23  0015  Antecubital  less than 1   Incision - 6 Ports 1: Left;Mid 2: Upper;Mid 3: Right;Upper 4: Right;Mid 5: Right;Lower 6: Right;Mid;Lateral 03/01/23  1334  -- 21            Labs/Imaging Results for orders placed or performed during the hospital encounter of 03/21/23 (from the past 48 hour(s))  Lipase, blood     Status: None   Collection Time: 03/21/23  9:18 PM  Result Value Ref Range   Lipase 48 11 - 51 U/L    Comment: Performed at [facility], [address] [place], [place] [address]  Comprehensive metabolic panel     Status: Abnormal   Collection Time: 03/21/23  9:18 PM  Result Value Ref Range   Sodium 135 135 - 145 mmol/L   Potassium 3.6 3.5 - 5.1 mmol/L   Chloride 97 (L) 98 - 111 mmol/L   CO2 28 22 - 32 mmol/L   Glucose, Bld 98 70 - 99 mg/dL    Comment: Glucose reference range applies only to samples taken after fasting for at least 8 hours.   BUN 11 8 - 23 mg/dL   Creatinine, Ser 4.33 0.44 - 1.00 mg/dL   Calcium 29.5 8.9 - 18.8 mg/dL   Total Protein 8.2 (H) 6.5 - 8.1 g/dL   Albumin 4.2 3.5 - 5.0 g/dL  AST 18 15 - 41 U/L   ALT 19 0 - 44 U/L   Alkaline Phosphatase 57 38 - 126 U/L   Total Bilirubin 0.4 0.3 - 1.2 mg/dL   GFR, Estimated >69 >62 mL/min    Comment: (NOTE) Calculated using the CKD-EPI Creatinine Equation (2021)    Anion gap 10 5 - 15    Comment: Performed at [facility], [address] [place], [place] [address]  CBC     Status: Abnormal   Collection Time: 03/21/23  9:18 PM  Result Value Ref Range   WBC 10.3 4.0 - 10.5 K/uL   RBC 4.40 3.87 - 5.11 MIL/uL   Hemoglobin 13.1 12.0 - 15.0 g/dL   HCT 13.2 44.0 - 10.2 %   MCV 92.7 80.0 - 100.0 fL   MCH 29.8 26.0 - 34.0 pg   MCHC 32.1 30.0 - 36.0 g/dL   RDW 72.5 36.6 - 44.0 %   Platelets 406 (H) 150 - 400 K/uL   nRBC 0.0 0.0 - 0.2 %    Comment: Performed at [facility], [address], [place],  [place] [address]  Urinalysis, Routine w reflex microscopic -Urine, Clean Catch     Status: Abnormal   Collection Time: 03/22/23 12:09 AM  Result Value Ref Range   Color, Urine YELLOW YELLOW   APPearance CLEAR CLEAR   Specific Gravity, Urine 1.008 1.005 - 1.030   pH 6.0 5.0 - 8.0   Glucose, UA NEGATIVE NEGATIVE mg/dL   Hgb urine dipstick NEGATIVE NEGATIVE   Bilirubin Urine NEGATIVE NEGATIVE   Ketones, ur NEGATIVE NEGATIVE mg/dL   Protein, ur NEGATIVE NEGATIVE mg/dL   Nitrite NEGATIVE NEGATIVE   Leukocytes,Ua TRACE (A) NEGATIVE   RBC / HPF 0-5 0 - 5 RBC/hpf   WBC, UA 0-5 0 - 5 WBC/hpf   Bacteria, UA NONE SEEN NONE SEEN   Squamous Epithelial / HPF 0-5 0 - 5 /HPF   Mucus PRESENT     Comment: Performed at [facility], [address], [place], [place] [address]   CT ABDOMEN PELVIS W CONTRAST  Result Date: 03/22/2023 CLINICAL DATA:  Upper abdominal pain for 1 day. Patient had colostomy reversal and partial colectomy 03/01/2023. Small-bowel obstruction after surgery. EXAM: CT ABDOMEN AND PELVIS WITH CONTRAST TECHNIQUE: Multidetector CT imaging of the abdomen and pelvis was performed using the standard protocol following bolus administration of intravenous contrast. RADIATION DOSE REDUCTION: This exam was performed according to the departmental dose-optimization program which includes automated exposure control, adjustment of the mA and/or kV according to patient size and/or use of iterative reconstruction technique. CONTRAST:  OMNIPAQUE IOHEXOL 300 MG/ML  SOLN COMPARISON:  Radiographs 03/14/2023 and CT abdomen and pelvis 03/11/2023 FINDINGS: Lower chest: Bibasilar scarring.  No acute abnormality. Hepatobiliary: No acute abnormality. Pancreas: Unremarkable. Spleen: Unremarkable. Adrenals/Urinary Tract: Stable adrenal glands. No urinary calculi or hydronephrosis. Unremarkable bladder. Stomach/Bowel: Stomach is within normal limits. Unchanged duodenal diverticulum. Postoperative  changes about the small bowel and colon with multiple anastomoses. Dilated fluid-filled loops of small bowel in the left hemiabdomen are grossly similar to CT 03/11/2023. There is similar tapering to an area of decompressed small bowel (circa series 2/43). This is similar to prior. The colon is normal in caliber. Colonic diverticulosis without diverticulitis. Vascular/Lymphatic: Aortic atherosclerosis. No enlarged abdominal or pelvic lymph nodes. Reproductive: Uterus and bilateral adnexa are unremarkable. Other: No free intraperitoneal fluid or air. Musculoskeletal: No acute osseous abnormality. Mild stranding and soft tissue thickening in  the left anterior paramidline abdomen, likely due to postoperative change. IMPRESSION: 1. Dilated fluid-filled loops of small bowel in the left hemiabdomen are grossly similar to CT 03/11/2023. There is similar tapering to an area of decompressed small bowel. Findings are concerning for partial small bowel obstruction versus ileus. Aortic Atherosclerosis (ICD10-I70.0). Electronically Signed   By: Minerva Fester M.D.   On: 03/22/2023 01:25    Pending Labs Unresulted Labs (From admission, onward)    None       Vitals/Pain Today's Vitals   03/21/23 2100 03/21/23 2101 03/22/23 0008  BP: (!) 140/87  126/69  Pulse: 98  79  Resp: 18  18  Temp:  98 F (36.7 C) (!) 97.3 F (36.3 C)  TempSrc:   Oral  SpO2: 97%  97%  Weight:  164 lb (74.4 kg)   Height:  5\' 6"  (1.676 m)   PainSc:  8      Isolation Precautions No active isolations  Medications Medications  0.9 %  sodium chloride infusion ( Intravenous New Bag/Given 03/22/23 0015)  0.9 %  sodium chloride infusion (has no administration in time range)  ondansetron (ZOFRAN) injection 4 mg (has no administration in time range)  HYDROmorphone (DILAUDID) injection 0.5 mg (has no administration in time range)  iohexol (OMNIPAQUE) 300 MG/ML solution 100 mL (100 mLs Intravenous Contrast Given 03/22/23 0024)  fentaNYL  (SUBLIMAZE) injection 25 mcg (25 mcg Intravenous Given 03/22/23 0101)    Mobility walks

## 2023-03-23 LAB — CBC
HCT: 35.1 % — ABNORMAL LOW (ref 36.0–46.0)
Hemoglobin: 10.9 g/dL — ABNORMAL LOW (ref 12.0–15.0)
MCH: 29.4 pg (ref 26.0–34.0)
MCHC: 31.1 g/dL (ref 30.0–36.0)
MCV: 94.6 fL (ref 80.0–100.0)
Platelets: 317 10*3/uL (ref 150–400)
RBC: 3.71 MIL/uL — ABNORMAL LOW (ref 3.87–5.11)
RDW: 13.4 % (ref 11.5–15.5)
WBC: 5.5 10*3/uL (ref 4.0–10.5)
nRBC: 0 % (ref 0.0–0.2)

## 2023-03-23 LAB — BASIC METABOLIC PANEL
Anion gap: 3 — ABNORMAL LOW (ref 5–15)
BUN: <5 mg/dL — ABNORMAL LOW (ref 8–23)
CO2: 26 mmol/L (ref 22–32)
Calcium: 8.6 mg/dL — ABNORMAL LOW (ref 8.9–10.3)
Chloride: 109 mmol/L (ref 98–111)
Creatinine, Ser: 0.5 mg/dL (ref 0.44–1.00)
GFR, Estimated: >60 mL/min (ref >60)
Glucose, Bld: 79 mg/dL (ref 70–99)
Potassium: 3.3 mmol/L — ABNORMAL LOW (ref 3.5–5.1)
Sodium: 138 mmol/L (ref 135–145)

## 2023-03-23 LAB — PHOSPHORUS: Phosphorus: 3.1 mg/dL (ref 2.5–4.6)

## 2023-03-23 LAB — MAGNESIUM: Magnesium: 1.7 mg/dL (ref 1.7–2.4)

## 2023-03-23 MED ORDER — ENOXAPARIN SODIUM 40 MG/0.4ML IJ SOSY
40.0000 mg | PREFILLED_SYRINGE | Freq: Every day | INTRAMUSCULAR | Status: DC
  Administered 2023-03-23: 40 mg via SUBCUTANEOUS
  Filled 2023-03-23: qty 0.4

## 2023-03-23 MED ORDER — CALCIUM POLYCARBOPHIL 625 MG PO TABS: 625.0000 mg | ORAL_TABLET | Freq: Two times a day (BID) | ORAL | 0 refills | Status: AC

## 2023-03-23 MED ORDER — HYDROMORPHONE HCL 1 MG/ML IJ SOLN: 0.5000 mg | INTRAMUSCULAR | Status: DC | PRN

## 2023-03-23 MED ORDER — METHOCARBAMOL 500 MG PO TABS: 500.0000 mg | ORAL_TABLET | Freq: Four times a day (QID) | ORAL | Status: DC | PRN

## 2023-03-23 MED ORDER — TRAMADOL HCL 50 MG PO TABS: 50.0000 mg | ORAL_TABLET | Freq: Four times a day (QID) | ORAL | 1 refills | Status: AC | PRN

## 2023-03-23 MED ORDER — POLYETHYLENE GLYCOL 3350 17 G PO PACK
17.0000 g | PACK | Freq: Two times a day (BID) | ORAL | Status: DC
  Administered 2023-03-23: 17 g via ORAL
  Filled 2023-03-23: qty 1

## 2023-03-23 MED ORDER — POTASSIUM CHLORIDE CRYS ER 20 MEQ PO TBCR
40.0000 meq | EXTENDED_RELEASE_TABLET | Freq: Two times a day (BID) | ORAL | Status: DC
  Administered 2023-03-23: 40 meq via ORAL
  Filled 2023-03-23: qty 2

## 2023-03-23 MED ORDER — POLYETHYLENE GLYCOL 3350 17 G PO PACK: 17.0000 g | PACK | Freq: Every day | ORAL | 0 refills | Status: AC | PRN

## 2023-03-23 MED ORDER — MAGNESIUM OXIDE -MG SUPPLEMENT 400 (240 MG) MG PO TABS
400.0000 mg | ORAL_TABLET | Freq: Two times a day (BID) | ORAL | Status: DC
  Administered 2023-03-23: 400 mg via ORAL
  Filled 2023-03-23: qty 1

## 2023-03-23 NOTE — Plan of Care (Signed)
AVS reviewed with patient. RN answered all questions pt had regarding AVS.  Pt has all belongings in hand and is being wheeled downstairs via wheelchair. Pt A&Ox4 and IV removed prior to discharge.  Problem: Education: Goal: Understanding of discharge needs will improve Outcome: Adequate for Discharge Goal: Verbalization of understanding of the causes of altered bowel function will improve Outcome: Adequate for Discharge   Problem: Activity: Goal: Ability to tolerate increased activity will improve Outcome: Adequate for Discharge   Problem: Bowel/Gastric: Goal: Gastrointestinal status for postoperative course will improve Outcome: Adequate for Discharge   Problem: Health Behavior/Discharge Planning: Goal: Identification of community resources to assist with postoperative recovery needs will improve Outcome: Adequate for Discharge   Problem: Nutritional: Goal: Will attain and maintain optimal nutritional status will improve Outcome: Adequate for Discharge   Problem: Clinical Measurements: Goal: Postoperative complications will be avoided or minimized Outcome: Adequate for Discharge   Problem: Respiratory: Goal: Respiratory status will improve Outcome: Adequate for Discharge   Problem: Skin Integrity: Goal: Will show signs of wound healing Outcome: Adequate for Discharge   Problem: Education: Goal: Knowledge of General Education information will improve Description: Including pain rating scale, medication(s)/side effects and non-pharmacologic comfort measures Outcome: Adequate for Discharge   Problem: Health Behavior/Discharge Planning: Goal: Ability to manage health-related needs will improve Outcome: Adequate for Discharge   Problem: Clinical Measurements: Goal: Ability to maintain clinical measurements within normal limits will improve Outcome: Adequate for Discharge Goal: Will remain free from infection Outcome: Adequate for Discharge Goal: Diagnostic test results  will improve Outcome: Adequate for Discharge Goal: Respiratory complications will improve Outcome: Adequate for Discharge Goal: Cardiovascular complication will be avoided Outcome: Adequate for Discharge   Problem: Activity: Goal: Risk for activity intolerance will decrease Outcome: Adequate for Discharge   Problem: Nutrition: Goal: Adequate nutrition will be maintained Outcome: Adequate for Discharge   Problem: Coping: Goal: Level of anxiety will decrease Outcome: Adequate for Discharge   Problem: Elimination: Goal: Will not experience complications related to bowel motility Outcome: Adequate for Discharge Goal: Will not experience complications related to urinary retention Outcome: Adequate for Discharge   Problem: Pain Managment: Goal: General experience of comfort will improve Outcome: Adequate for Discharge   Problem: Safety: Goal: Ability to remain free from injury will improve Outcome: Adequate for Discharge   Problem: Skin Integrity: Goal: Risk for impaired skin integrity will decrease Outcome: Adequate for Discharge

## 2023-03-23 NOTE — Discharge Summary (Signed)
[facility] Discharge Summary   Patient ID: Judith Miller MRN: [id number] DOB/AGE: 1961/08/12 62 y.o.  Admit date: 03/21/2023 Discharge date: 03/23/2023  Admitting Diagnosis: Post-operative abdominal pain  Discharge Diagnosis Same  Consultants None   Imaging: DG Abd Portable 1V-Small Bowel Obstruction Protocol-initial, 8 hr delay  Result Date: 03/22/2023 CLINICAL DATA:  8 hour delay for small-bowel obstruction. EXAM: PORTABLE ABDOMEN - 1 VIEW COMPARISON:  CT 03/22/2023 FINDINGS: Contrast material is demonstrated in the colon. Transit to the colon indicates no evidence of high-grade obstruction. Scattered gas-filled small bowel are present possibly due to ileus. IMPRESSION: Contrast material is demonstrated in the colon suggesting no evidence of high-grade obstruction. Electronically Signed   By: Burman Nieves M.D.   On: 03/22/2023 19:02   CT ABDOMEN PELVIS W CONTRAST  Result Date: 03/22/2023 CLINICAL DATA:  Upper abdominal pain for 1 day. Patient had colostomy reversal and partial colectomy 03/01/2023. Small-bowel obstruction after surgery. EXAM: CT ABDOMEN AND PELVIS WITH CONTRAST TECHNIQUE: Multidetector CT imaging of the abdomen and pelvis was performed using the standard protocol following bolus administration of intravenous contrast. RADIATION DOSE REDUCTION: This exam was performed according to the departmental dose-optimization program which includes automated exposure control, adjustment of the mA and/or kV according to patient size and/or use of iterative reconstruction technique. CONTRAST:  OMNIPAQUE IOHEXOL 300 MG/ML  SOLN COMPARISON:  Radiographs 03/14/2023 and CT abdomen and pelvis 03/11/2023 FINDINGS: Lower chest: Bibasilar scarring.  No acute abnormality. Hepatobiliary: No acute abnormality. Pancreas: Unremarkable. Spleen: Unremarkable. Adrenals/Urinary Tract: Stable adrenal glands. No urinary calculi or hydronephrosis. Unremarkable bladder.  Stomach/Bowel: Stomach is within normal limits. Unchanged duodenal diverticulum. Postoperative changes about the small bowel and colon with multiple anastomoses. Dilated fluid-filled loops of small bowel in the left hemiabdomen are grossly similar to CT 03/11/2023. There is similar tapering to an area of decompressed small bowel (circa series 2/43). This is similar to prior. The colon is normal in caliber. Colonic diverticulosis without diverticulitis. Vascular/Lymphatic: Aortic atherosclerosis. No enlarged abdominal or pelvic lymph nodes. Reproductive: Uterus and bilateral adnexa are unremarkable. Other: No free intraperitoneal fluid or air. Musculoskeletal: No acute osseous abnormality. Mild stranding and soft tissue thickening in the left anterior paramidline abdomen, likely due to postoperative change. IMPRESSION: 1. Dilated fluid-filled loops of small bowel in the left hemiabdomen are grossly similar to CT 03/11/2023. There is similar tapering to an area of decompressed small bowel. Findings are concerning for partial small bowel obstruction versus ileus. Aortic Atherosclerosis (ICD10-I70.0). Electronically Signed   By: Minerva Fester M.D.   On: 03/22/2023 01:25    Procedures Dr. Karie Soda (03/01/23) - ROBOTIC LOW ANTERIOR RECTOSIGMOID RESECTION TAKEDOWN OF LOOP COLOSTOMY WITH ANASTOMOSIS ILEAL SMALL BOWEL RESECTION APPENDECTOMY LYSIS OF ADHESIONS x120 MINUTES (50% OF CASE) INTRAOPERATIVE ASSESSMENT OF TISSUE VASCULAR PERFUSION USING ICG (indocyanine green) IMMUNOFLUORESCENCE, TRANSVERSUS ABDOMINIS PLANE (TAP) BLOCK - BILATERAL FLEXIBLE SIGMOIDOSCOPY  Hospital Course:  Patient is a 62 year old female who presented to the ED with abdominal pain.  Workup showed nothing acute.  Patient was admitted for observation and pain control.  On 03/23/23, the patient was voiding well, tolerating diet, ambulating well, pain well controlled, vital signs stable, incisions c/d/i and felt stable for discharge home.   Patient will follow up in our office as planned.  I or a member of my team have reviewed this patient in the Controlled Substance Database.   Allergies as of 03/23/2023       Reactions   Aripiprazole    Hallucinations  Sulfa Antibiotics Other (See Comments)   Headaches        Medication List     TAKE these medications    acetaminophen 500 MG tablet Commonly known as: TYLENOL Take 500 mg by mouth every 6 (six) hours as needed for mild pain or moderate pain.   BEET ROOT PO Take 1,000 mg by mouth daily.   Co Q-10 100 MG Caps Take 100 mg by mouth 2 (two) times daily.   COLLAGEN PO Take 3 g by mouth daily.   levocetirizine 5 MG tablet Commonly known as: XYZAL Take 5 mg by mouth every evening.   Magnesium 250 MG Tabs Take 250 mg by mouth in the morning.   methocarbamol 500 MG tablet Commonly known as: ROBAXIN Take 1 tablet (500 mg total) by mouth every 8 (eight) hours as needed for muscle spasms.   multivitamin tablet Take 1 tablet by mouth daily.   Normal Saline Flush 0.9 % Soln Inject 10 MLs by intracatheter daily   Normal Saline Flush 0.9 % Soln Flush with 10 mLs by Intracatheter route daily.   OVER THE COUNTER MEDICATION Take 1 tablet by mouth 2 (two) times daily. Medication: Brain & Memory Performance   oxyCODONE 5 MG immediate release tablet Commonly known as: Oxy IR/ROXICODONE Take 1-2 tablets (5-10 mg total) by mouth every 6 (six) hours as needed for moderate pain, severe pain or breakthrough pain.   polycarbophil 625 MG tablet Commonly known as: FIBERCON Take 1 tablet (625 mg total) by mouth 2 (two) times daily.   polyethylene glycol 17 g packet Commonly known as: MIRALAX / GLYCOLAX Take 17 g by mouth daily as needed for mild constipation.   Potassium 99 MG Tabs Take 99 mg by mouth daily.   traMADol 50 MG tablet Commonly known as: ULTRAM Take 1-2 tablets (50-100 mg total) by mouth every 6 (six) hours as needed for moderate pain or severe  pain.   Turmeric 500 MG Caps Take 500 mg by mouth daily.   VITAMIN B-12 PO Take 2,500 mcg by mouth daily.   Vitamin D3 10 MCG (400 UNIT) tablet Take 400 Units by mouth daily.   vitamin E 180 MG (400 UNITS) capsule Take 400 Units by mouth daily.          Follow-up Information     Karie Soda, MD. Go on 04/13/2023.   Specialties: General Surgery, Colon and Rectal Surgery Why: Follow up as scheduled Contact information: [address] [place] [address] [phone]                 Signed: Juliet Rude , [facility] 03/23/2023, 2:05 PM Please see Amion for pager number during day hours 7:00am-4:30pm

## 2023-03-23 NOTE — Progress Notes (Signed)
Patient ID: Judith Miller, female   DOB: 1961/06/07, 62 y.o.   MRN: [id number] [facility] Surgery Progress Note     Subjective: CC-  Abdomen still sore but overall feeling better. She reports having multiple Bms over night. Passing flatus. Tolerating liquids without nausea or vomiting. She does feel hungry.  Objective: Vital signs in last 24 hours: Temp:  [97.5 F (36.4 C)-97.8 F (36.6 C)] 97.7 F (36.5 C) (07/18 0537) Pulse Rate:  [61-69] 64 (07/18 0537) Resp:  [17-18] 18 (07/18 0537) BP: (111-137)/(58-79) 137/79 (07/18 0537) SpO2:  [96 %-98 %] 97 % (07/18 0537) Last BM Date : 03/22/23  Intake/Output from previous day: 07/17 0701 - 07/18 0700 In: 3247.1 [P.O.:690; I.V.:2227.1; IV Piggyback:330] Out: 1400 [Urine:1400] Intake/Output this shift: Total I/O In: 480 [P.O.:480] Out: -   PE: Gen:  Alert, NAD, pleasant Pulm:  rate and effort normal on room air Abd: Soft, ND, appropriately tender over incision, incisions cdi without erythema or drainage  Lab Results:  Recent Labs    03/21/23 2118 03/23/23 0528  WBC 10.3 5.5  HGB 13.1 10.9*  HCT 40.8 35.1*  PLT 406* 317   BMET Recent Labs    03/21/23 2118 03/23/23 0528  NA 135 138  K 3.6 3.3*  CL 97* 109  CO2 28 26  GLUCOSE 98 79  BUN 11 <5*  CREATININE 0.63 0.50  CALCIUM 10.0 8.6*   PT/INR No results for input(s): "LABPROT", "INR" in the last 72 hours. CMP     Component Value Date/Time   NA 138 03/23/2023 0528   K 3.3 (L) 03/23/2023 0528   CL 109 03/23/2023 0528   CO2 26 03/23/2023 0528   GLUCOSE 79 03/23/2023 0528   BUN <5 (L) 03/23/2023 0528   CREATININE 0.50 03/23/2023 0528   CALCIUM 8.6 (L) 03/23/2023 0528   PROT 8.2 (H) 03/21/2023 2118   ALBUMIN 4.2 03/21/2023 2118   AST 18 03/21/2023 2118   ALT 19 03/21/2023 2118   ALKPHOS 57 03/21/2023 2118   BILITOT 0.4 03/21/2023 2118   GFRNONAA >60 03/23/2023 0528   GFRAA  02/27/2010 2026    >60        The eGFR has been calculated using the  MDRD equation. This calculation has not been validated in all clinical situations. eGFR's persistently <60 mL/min signify possible Chronic Kidney Disease.   Lipase     Component Value Date/Time   LIPASE 48 03/21/2023 2118       Studies/Results: DG Abd Portable 1V-Small Bowel Obstruction Protocol-initial, 8 hr delay  Result Date: 03/22/2023 CLINICAL DATA:  8 hour delay for small-bowel obstruction. EXAM: PORTABLE ABDOMEN - 1 VIEW COMPARISON:  CT 03/22/2023 FINDINGS: Contrast material is demonstrated in the colon. Transit to the colon indicates no evidence of high-grade obstruction. Scattered gas-filled small bowel are present possibly due to ileus. IMPRESSION: Contrast material is demonstrated in the colon suggesting no evidence of high-grade obstruction. Electronically Signed   By: Burman Nieves M.D.   On: 03/22/2023 19:02   CT ABDOMEN PELVIS W CONTRAST  Result Date: 03/22/2023 CLINICAL DATA:  Upper abdominal pain for 1 day. Patient had colostomy reversal and partial colectomy 03/01/2023. Small-bowel obstruction after surgery. EXAM: CT ABDOMEN AND PELVIS WITH CONTRAST TECHNIQUE: Multidetector CT imaging of the abdomen and pelvis was performed using the standard protocol following bolus administration of intravenous contrast. RADIATION DOSE REDUCTION: This exam was performed according to the departmental dose-optimization program which includes automated exposure control, adjustment of the mA and/or kV according  to patient size and/or use of iterative reconstruction technique. CONTRAST:  OMNIPAQUE IOHEXOL 300 MG/ML  SOLN COMPARISON:  Radiographs 03/14/2023 and CT abdomen and pelvis 03/11/2023 FINDINGS: Lower chest: Bibasilar scarring.  No acute abnormality. Hepatobiliary: No acute abnormality. Pancreas: Unremarkable. Spleen: Unremarkable. Adrenals/Urinary Tract: Stable adrenal glands. No urinary calculi or hydronephrosis. Unremarkable bladder. Stomach/Bowel: Stomach is within normal  limits. Unchanged duodenal diverticulum. Postoperative changes about the small bowel and colon with multiple anastomoses. Dilated fluid-filled loops of small bowel in the left hemiabdomen are grossly similar to CT 03/11/2023. There is similar tapering to an area of decompressed small bowel (circa series 2/43). This is similar to prior. The colon is normal in caliber. Colonic diverticulosis without diverticulitis. Vascular/Lymphatic: Aortic atherosclerosis. No enlarged abdominal or pelvic lymph nodes. Reproductive: Uterus and bilateral adnexa are unremarkable. Other: No free intraperitoneal fluid or air. Musculoskeletal: No acute osseous abnormality. Mild stranding and soft tissue thickening in the left anterior paramidline abdomen, likely due to postoperative change. IMPRESSION: 1. Dilated fluid-filled loops of small bowel in the left hemiabdomen are grossly similar to CT 03/11/2023. There is similar tapering to an area of decompressed small bowel. Findings are concerning for partial small bowel obstruction versus ileus. Aortic Atherosclerosis (ICD10-I70.0). Electronically Signed   By: Minerva Fester M.D.   On: 03/22/2023 01:25    Anti-infectives: Anti-infectives (From admission, onward)    None        Assessment/Plan Postop abdominal pain -POD#22 s/p ROBOTIC LOW ANTERIOR RECTOSIGMOID RESECTION, TAKEDOWN OF LOOP COLOSTOMY WITH ANASTOMOSIS, ILEAL SMALL BOWEL RESECTION, APPENDECTOMY, LYSIS OF ADHESIONS x120 MINUTES (50% OF CASE), INTRAOPERATIVE ASSESSMENT OF TISSUE VASCULAR PERFUSION USING ICG (indocyanine green) IMMUNOFLUORESCENCE, TRANSVERSUS ABDOMINIS PLANE (TAP) BLOCK - BILATERAL, FLEXIBLE SIGMOIDOSCOPY 03/01/23 Dr. Michaell Cowing - Passed SBO protocol. Tolerating clear liquids and having bowel function. Advance to soft diet. Add miralax. Replace K and Mag. Continue oral pain medication regimen. Will recheck later today for possible discharge if tolerating diet advancement. She has follow up in our office  04/13/23.  ID - none FEN - soft diet, SLIV VTE - lovenox Foley - none    LOS: 0 days    Franne Forts, [facility] 03/23/2023, 10:40 AM Please see Amion for pager number during day hours 7:00am-4:30pm

## 2023-03-23 NOTE — Plan of Care (Signed)
  Problem: Education: Goal: Verbalization of understanding of the causes of altered bowel function will improve Outcome: Progressing   Problem: Elimination: Goal: Will not experience complications related to bowel motility Outcome: Progressing

## 2023-03-23 NOTE — TOC Initial Note (Signed)
Transition of Care [facility]) - Initial/Assessment Note    Patient Details  Name: Judith Miller MRN: [id number] Date of Birth: December 09, 1960  [facility] CM/SW Contact:    Judith Prows, RN Phone Number: 03/23/2023, 1:15 PM  Clinical Narrative:                 [facility] consult for d/c planning; pt says she is from home and plans to return at d/c; she identified POC Judith Miller (son) [phone]; pt says she has transportation; she also confirms she has PCP and insurance; pt denies food/housing insecurity, difficulty paying utilities, and IPV; pt says she has reading glasses, and BSC; she does not have HH services or home oxygen; pt says she lost her job 6 weeks ago and would like resource for financial assistance; resource placed in d/c instructions, and copy given to pt; she will contact agency of choice; no TOC needs; please place consult if needed.  Expected Discharge Plan: Home/Self Care Barriers to Discharge: Continued Medical Work up   Patient Goals and CMS Choice Patient states their goals for this hospitalization and ongoing recovery are:: home          Expected Discharge Plan and Services   Discharge Planning Services: CM Consult Post Acute Care Choice: NA Living arrangements for the past 2 months: Single Family Home                                      Prior Living Arrangements/Services Living arrangements for the past 2 months: Single Family Home Lives with:: Self Patient language and need for interpreter reviewed:: Yes Do you feel safe going back to the place where you live?: Yes      Need for Family Participation in Patient Care: Yes (Comment) Care giver support system in place?: Yes (comment) Current home services: DME (BSC) Criminal Activity/Legal Involvement Pertinent to Current Situation/Hospitalization: No - Comment as needed  Activities of Daily Living Home Assistive Devices/Equipment: Eyeglasses ADL Screening (condition at time  of admission) Patient's cognitive ability adequate to safely complete daily activities?: Yes Is the patient deaf or have difficulty hearing?: No Does the patient have difficulty seeing, even when wearing glasses/contacts?: No Does the patient have difficulty concentrating, remembering, or making decisions?: No Patient able to express need for assistance with ADLs?: Yes Does the patient have difficulty dressing or bathing?: No Independently performs ADLs?: Yes (appropriate for developmental age) Does the patient have difficulty walking or climbing stairs?: No Weakness of Legs: None Weakness of Arms/Hands: None  Permission Sought/Granted Permission sought to share information with : Case Manager Permission granted to share information with : Yes, Verbal Permission Granted  Share Information with NAME: Case Manager     Permission granted to share info w Relationship: Judith Miller (son) [phone]     Emotional Assessment Appearance:: Appears older than stated age Attitude/Demeanor/Rapport: Gracious Affect (typically observed): Accepting Orientation: : Oriented to Self, Oriented to Place, Oriented to  Time, Oriented to Situation Alcohol / Substance Use: Not Applicable Psych Involvement: No (comment)  Admission diagnosis:  SBO (small bowel obstruction) (HCC) [K56.609] Patient Active Problem List   Diagnosis Date Noted   Inadequate pain control 03/22/2023   Hypokalemia 03/14/2023   Anxiety 03/14/2023   Arthritis 03/13/2023   Ileus (HCC) 03/11/2023   Bipolar affective disorder, currently depressed, mild (HCC) 01/16/2023   Tobacco abuse 01/16/2023   Bipolar disorder (HCC) 12/28/2022  PCP:  Practice, [facility]:   [facility] [address], [place] - [address] [place] [address] Phone: [phone] Fax: [phone]     Social Determinants of Health (SDOH) Social History: SDOH Screenings   Food Insecurity: No Food Insecurity (03/23/2023)   Housing: Low Risk  (03/23/2023)  Transportation Needs: No Transportation Needs (03/23/2023)  Utilities: Not At Risk (03/23/2023)  Tobacco Use: Medium Risk (03/22/2023)   SDOH Interventions: Food Insecurity Interventions: Intervention Not Indicated, Inpatient TOC Housing Interventions: Intervention Not Indicated, Inpatient TOC Transportation Interventions: Intervention Not Indicated, Inpatient TOC Utilities Interventions: Intervention Not Indicated, Inpatient TOC   Readmission Risk Interventions    03/13/2023   10:07 AM 03/02/2023    3:38 PM  Readmission Risk Prevention Plan  Post Dischage Appt Complete Complete  Medication Screening Complete Complete  Transportation Screening Complete Complete

## 2023-05-02 DIAGNOSIS — R030 Elevated blood-pressure reading, without diagnosis of hypertension: Secondary | ICD-10-CM | POA: Diagnosis not present

## 2023-05-02 DIAGNOSIS — F319 Bipolar disorder, unspecified: Secondary | ICD-10-CM | POA: Diagnosis not present

## 2023-05-02 DIAGNOSIS — Z818 Family history of other mental and behavioral disorders: Secondary | ICD-10-CM | POA: Diagnosis not present

## 2023-05-02 DIAGNOSIS — Z791 Long term (current) use of non-steroidal anti-inflammatories (NSAID): Secondary | ICD-10-CM | POA: Diagnosis not present

## 2023-05-02 DIAGNOSIS — M1990 Unspecified osteoarthritis, unspecified site: Secondary | ICD-10-CM | POA: Diagnosis not present

## 2023-05-02 DIAGNOSIS — Z59819 Housing instability, housed unspecified: Secondary | ICD-10-CM | POA: Diagnosis not present

## 2023-05-02 DIAGNOSIS — Z91199 Patient's noncompliance with other medical treatment and regimen due to unspecified reason: Secondary | ICD-10-CM | POA: Diagnosis not present

## 2023-05-02 DIAGNOSIS — Z882 Allergy status to sulfonamides status: Secondary | ICD-10-CM | POA: Diagnosis not present

## 2023-05-02 DIAGNOSIS — Z8249 Family history of ischemic heart disease and other diseases of the circulatory system: Secondary | ICD-10-CM | POA: Diagnosis not present

## 2023-05-02 DIAGNOSIS — F17210 Nicotine dependence, cigarettes, uncomplicated: Secondary | ICD-10-CM | POA: Diagnosis not present

## 2023-09-04 ENCOUNTER — Encounter (HOSPITAL_COMMUNITY): Payer: Self-pay | Admitting: *Deleted

## 2023-09-04 ENCOUNTER — Emergency Department (HOSPITAL_COMMUNITY): Payer: Commercial Managed Care - HMO

## 2023-09-04 ENCOUNTER — Emergency Department (HOSPITAL_COMMUNITY)
Admission: EM | Admit: 2023-09-04 | Discharge: 2023-09-04 | Disposition: A | Discharge status: Home / Self Care | Payer: Commercial Managed Care - HMO | Attending: Emergency Medicine | Admitting: Emergency Medicine

## 2023-09-04 ENCOUNTER — Other Ambulatory Visit: Payer: Self-pay

## 2023-09-04 DIAGNOSIS — M25531 Pain in right wrist: Secondary | ICD-10-CM | POA: Diagnosis present

## 2023-09-04 DIAGNOSIS — M654 Radial styloid tenosynovitis [de Quervain]: Secondary | ICD-10-CM | POA: Insufficient documentation

## 2023-09-04 MED ORDER — DEXAMETHASONE SODIUM PHOSPHATE 10 MG/ML IJ SOLN
10.0000 mg | Freq: Once | INTRAMUSCULAR | Status: AC
  Administered 2023-09-04: 10 mg via INTRAMUSCULAR
  Filled 2023-09-04: qty 1

## 2023-09-04 MED ORDER — DEXAMETHASONE SODIUM PHOSPHATE 10 MG/ML IJ SOLN: 10.0000 mg | Freq: Once | INTRAMUSCULAR | Status: DC

## 2023-09-04 NOTE — Discharge Instructions (Signed)
As we discussed, your workup in the ER today was reassuring for acute findings.  X-ray imaging of your hand did not reveal any fracture or dislocation.  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  This is basically inflammation in a tendon at the base of the thumb. The treatment of this is bracing with the brace we have given you, rest, ice, and elevation.  Please wear the brace anytime you are doing activities.  You may remove it to shower and sleep.  We have also given you a dose of steroids to treat the inflammation.  I have also given you a referral to orthopedics with a number to call to schedule an appointment for follow-up.  Please call at your earliest convenience.  Return if development of any new or worsening symptoms.

## 2023-09-04 NOTE — ED Triage Notes (Signed)
Pt with right wrist pain for x 2 weeks, denies any injuries.
